# Patient Record
Sex: Female | Born: 1940 | Race: White | Hispanic: No | Smoking: Former smoker
Health system: Southern US, Community
[De-identification: ages and names within clinical notes are randomized; demographics above are authoritative.]

## PROBLEM LIST (undated history)

## (undated) DIAGNOSIS — J40 Bronchitis, not specified as acute or chronic: Secondary | ICD-10-CM

## (undated) DIAGNOSIS — F329 Major depressive disorder, single episode, unspecified: Secondary | ICD-10-CM

## (undated) DIAGNOSIS — F419 Anxiety disorder, unspecified: Secondary | ICD-10-CM

## (undated) DIAGNOSIS — M1990 Unspecified osteoarthritis, unspecified site: Secondary | ICD-10-CM

## (undated) DIAGNOSIS — E785 Hyperlipidemia, unspecified: Secondary | ICD-10-CM

## (undated) DIAGNOSIS — J45909 Unspecified asthma, uncomplicated: Secondary | ICD-10-CM

## (undated) DIAGNOSIS — F32A Depression, unspecified: Secondary | ICD-10-CM

## (undated) DIAGNOSIS — E7800 Pure hypercholesterolemia, unspecified: Secondary | ICD-10-CM

## (undated) HISTORY — DX: Hyperlipidemia, unspecified: E78.5

## (undated) HISTORY — PX: APPENDECTOMY: SHX54

## (undated) HISTORY — DX: Unspecified osteoarthritis, unspecified site: M19.90

## (undated) HISTORY — PX: BREAST BIOPSY: SHX20

## (undated) HISTORY — DX: Anxiety disorder, unspecified: F41.9

## (undated) HISTORY — PX: ABDOMINAL HYSTERECTOMY: SHX81

---

## 2010-12-02 ENCOUNTER — Ambulatory Visit: Payer: Self-pay | Admitting: Family Medicine

## 2012-05-18 ENCOUNTER — Ambulatory Visit: Payer: Self-pay | Admitting: Internal Medicine

## 2013-04-18 LAB — HM MAMMOGRAPHY: HM Mammogram: NORMAL

## 2014-02-25 ENCOUNTER — Ambulatory Visit: Payer: Self-pay | Admitting: Internal Medicine

## 2014-03-04 ENCOUNTER — Ambulatory Visit: Payer: Self-pay | Admitting: Internal Medicine

## 2014-10-22 ENCOUNTER — Ambulatory Visit (INDEPENDENT_AMBULATORY_CARE_PROVIDER_SITE_OTHER): Payer: Medicare HMO | Admitting: Internal Medicine

## 2014-10-22 ENCOUNTER — Encounter: Payer: Self-pay | Admitting: Internal Medicine

## 2014-10-22 VITALS — BP 110/60 | HR 104 | Temp 99.0°F | Ht 60.0 in | Wt 138.4 lb

## 2014-10-22 DIAGNOSIS — J40 Bronchitis, not specified as acute or chronic: Secondary | ICD-10-CM | POA: Insufficient documentation

## 2014-10-22 DIAGNOSIS — E782 Mixed hyperlipidemia: Secondary | ICD-10-CM | POA: Insufficient documentation

## 2014-10-22 DIAGNOSIS — D510 Vitamin B12 deficiency anemia due to intrinsic factor deficiency: Secondary | ICD-10-CM

## 2014-10-22 DIAGNOSIS — J45909 Unspecified asthma, uncomplicated: Secondary | ICD-10-CM

## 2014-10-22 DIAGNOSIS — F329 Major depressive disorder, single episode, unspecified: Secondary | ICD-10-CM | POA: Insufficient documentation

## 2014-10-22 DIAGNOSIS — N951 Menopausal and female climacteric states: Secondary | ICD-10-CM | POA: Insufficient documentation

## 2014-10-22 DIAGNOSIS — F32A Depression, unspecified: Secondary | ICD-10-CM | POA: Insufficient documentation

## 2014-10-22 DIAGNOSIS — E538 Deficiency of other specified B group vitamins: Secondary | ICD-10-CM | POA: Insufficient documentation

## 2014-10-22 HISTORY — DX: Unspecified asthma, uncomplicated: J45.909

## 2014-10-22 MED ORDER — ALBUTEROL SULFATE HFA 108 (90 BASE) MCG/ACT IN AERS: 2.0000 | INHALATION_SPRAY | Freq: Four times a day (QID) | RESPIRATORY_TRACT | Status: DC | PRN

## 2014-10-22 MED ORDER — AMOXICILLIN-POT CLAVULANATE 875-125 MG PO TABS: 1.0000 | ORAL_TABLET | Freq: Two times a day (BID) | ORAL | Status: DC

## 2014-10-22 MED ORDER — GUAIFENESIN-CODEINE 100-10 MG/5ML PO SYRP: 5.0000 mL | ORAL_SOLUTION | Freq: Three times a day (TID) | ORAL | Status: DC | PRN

## 2014-10-22 NOTE — Progress Notes (Signed)
Date:  10/22/2014   Name:  Jennifer Knapp   DOB:  02-28-1941   MRN:  [id number]   Chief Complaint: Cough Cough This is a new problem. The current episode started in the past 7 days. The problem has been gradually improving. The problem occurs constantly. The cough is productive of purulent sputum. Associated symptoms include a fever, nasal congestion, a sore throat and shortness of breath. Pertinent negatives include no chest pain, chills or headaches. She has tried OTC cough suppressant for the symptoms. The treatment provided no relief. Her past medical history is significant for asthma.     Review of Systems:  Review of Systems  Constitutional: Positive for fever. Negative for chills.  HENT: Positive for sore throat.   Respiratory: Positive for cough and shortness of breath.   Cardiovascular: Negative for chest pain.  Gastrointestinal: Negative for abdominal pain.  Neurological: Negative for syncope, light-headedness and headaches.    Patient Active Problem List   Diagnosis Date Noted  . 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 Clinical depression 10/22/2014  . AB (asthmatic bronchitis) 10/22/2014  . Addison anemia 10/22/2014  . Hot flash, menopausal 10/22/2014    Prior to Admission medications   Medication Sig Start Date End Date Taking? Authorizing Provider  ARIPiprazole (ABILIFY) 10 MG tablet Take 5 mg by mouth daily at 2 PM daily at 2 PM.   Yes Historical Provider, MD  desvenlafaxine (PRISTIQ) 50 MG 24 hr tablet Take 1 tablet by mouth daily.   Yes Historical Provider, MD  diazepam (VALIUM) 5 MG tablet Take 1 tablet by mouth 2 (two) times daily.   Yes Historical Provider, MD  estradiol (ESTRACE) 0.5 MG tablet Take 1 tablet by mouth daily. 12/31/13  Yes Historical Provider, MD  simvastatin (ZOCOR) 40 MG tablet Take 1 tablet by mouth daily. 09/01/14  Yes Historical Provider, MD    No Known Allergies  Past Surgical History  Procedure Laterality Date   . Breast biopsy      x 2  . Appendectomy    . Abdominal hysterectomy      History  Substance Use Topics  . Smoking status: Former Games developermoker  . Smokeless tobacco: Not on file  . Alcohol Use: No     Medication list has been reviewed and updated.  Physical Examination:  Physical Exam  Constitutional: She appears well-developed and well-nourished. No distress.  HENT:  Right Ear: Tympanic membrane and ear canal normal.  Left Ear: Tympanic membrane and ear canal normal.  Nose: Right sinus exhibits no maxillary sinus tenderness. Left sinus exhibits no maxillary sinus tenderness.  Mouth/Throat: Posterior oropharyngeal erythema present.  Eyes: Pupils are equal, round, and reactive to light.  Neck: Normal range of motion. Neck supple. No thyromegaly present.  Cardiovascular: Normal rate, regular rhythm and S1 normal.   Pulmonary/Chest: Effort normal. She has decreased breath sounds. She has no wheezes. She has no rhonchi.  Lymphadenopathy:    She has no cervical adenopathy.  Psychiatric: She has a normal mood and affect.    BP 110/60 mmHg  Pulse 104  Temp(Src) 99 F (37.2 C)  Ht 5' (1.524 m)  Wt 138 lb 6.4 oz (62.778 kg)  BMI 27.03 kg/m2  SpO2 96%  Assessment and Plan: 1. Bronchitis Increase fluids; take tylenol for fever - amoxicillin-clavulanate (AUGMENTIN) 875-125 MG per tablet; Take 1 tablet by mouth 2 (two) times daily.  Dispense: 20 tablet; Refill: 0 - albuterol (PROVENTIL HFA;VENTOLIN HFA) 108 (90 BASE) MCG/ACT inhaler; Inhale 2 puffs into  the lungs every 6 (six) hours as needed for wheezing or shortness of breath.  Dispense: 1 Inhaler; Refill: 0 - guaiFENesin-codeine (ROBITUSSIN AC) 100-10 MG/5ML syrup; Take 5 mLs by mouth 3 (three) times daily as needed for cough.  Dispense: 120 mL; Refill: 0   Bari Edward, MD [facility]  10/22/2014

## 2014-10-23 ENCOUNTER — Ambulatory Visit: Payer: Self-pay | Admitting: Internal Medicine

## 2014-12-15 ENCOUNTER — Other Ambulatory Visit: Payer: Self-pay | Admitting: Internal Medicine

## 2015-03-10 ENCOUNTER — Other Ambulatory Visit: Payer: Self-pay | Admitting: Internal Medicine

## 2015-04-03 ENCOUNTER — Other Ambulatory Visit: Payer: Self-pay | Admitting: Internal Medicine

## 2015-04-19 ENCOUNTER — Emergency Department: Payer: Medicare Other

## 2015-04-19 ENCOUNTER — Encounter: Payer: Self-pay | Admitting: Emergency Medicine

## 2015-04-19 ENCOUNTER — Emergency Department
Admission: EM | Admit: 2015-04-19 | Discharge: 2015-04-20 | Disposition: A | Discharge status: Home / Self Care | Payer: Medicare Other | Attending: Emergency Medicine | Admitting: Emergency Medicine

## 2015-04-19 DIAGNOSIS — S199XXA Unspecified injury of neck, initial encounter: Secondary | ICD-10-CM | POA: Diagnosis not present

## 2015-04-19 DIAGNOSIS — Y998 Other external cause status: Secondary | ICD-10-CM | POA: Insufficient documentation

## 2015-04-19 DIAGNOSIS — Z792 Long term (current) use of antibiotics: Secondary | ICD-10-CM | POA: Diagnosis not present

## 2015-04-19 DIAGNOSIS — Z79899 Other long term (current) drug therapy: Secondary | ICD-10-CM | POA: Insufficient documentation

## 2015-04-19 DIAGNOSIS — S022XXA Fracture of nasal bones, initial encounter for closed fracture: Secondary | ICD-10-CM | POA: Insufficient documentation

## 2015-04-19 DIAGNOSIS — Y9389 Activity, other specified: Secondary | ICD-10-CM | POA: Insufficient documentation

## 2015-04-19 DIAGNOSIS — Y9289 Other specified places as the place of occurrence of the external cause: Secondary | ICD-10-CM | POA: Insufficient documentation

## 2015-04-19 DIAGNOSIS — S0033XA Contusion of nose, initial encounter: Secondary | ICD-10-CM | POA: Insufficient documentation

## 2015-04-19 DIAGNOSIS — S43004A Unspecified dislocation of right shoulder joint, initial encounter: Secondary | ICD-10-CM

## 2015-04-19 DIAGNOSIS — W1839XA Other fall on same level, initial encounter: Secondary | ICD-10-CM | POA: Insufficient documentation

## 2015-04-19 DIAGNOSIS — S0083XA Contusion of other part of head, initial encounter: Secondary | ICD-10-CM | POA: Diagnosis not present

## 2015-04-19 DIAGNOSIS — Z87891 Personal history of nicotine dependence: Secondary | ICD-10-CM | POA: Insufficient documentation

## 2015-04-19 DIAGNOSIS — S0992XA Unspecified injury of nose, initial encounter: Secondary | ICD-10-CM | POA: Diagnosis present

## 2015-04-19 DIAGNOSIS — S0031XA Abrasion of nose, initial encounter: Secondary | ICD-10-CM | POA: Insufficient documentation

## 2015-04-19 DIAGNOSIS — W19XXXA Unspecified fall, initial encounter: Secondary | ICD-10-CM

## 2015-04-19 HISTORY — DX: Major depressive disorder, single episode, unspecified: F32.9

## 2015-04-19 HISTORY — DX: Pure hypercholesterolemia, unspecified: E78.00

## 2015-04-19 HISTORY — DX: Depression, unspecified: F32.A

## 2015-04-19 NOTE — ED Notes (Signed)
Pt. States she was putting her dog away when she tripped, pt states she fell on face and rt. Shoulder.  Pt. States no pain when not moving.  ETOH on board.

## 2015-04-19 NOTE — ED Notes (Signed)
Pt. States she was putting dog away when she fell witnessed by husband.  Pt. Denies LOC.  Pt. Has deformity to nose and rt. Shoulder.  ETOH on board.

## 2015-04-20 ENCOUNTER — Emergency Department: Payer: Medicare Other

## 2015-04-20 LAB — COMPREHENSIVE METABOLIC PANEL
ALT: 16 U/L (ref 14–54)
AST: 28 U/L (ref 15–41)
Albumin: 4 g/dL (ref 3.5–5.0)
Alkaline Phosphatase: 50 U/L (ref 38–126)
Anion gap: 9 (ref 5–15)
BUN: 12 mg/dL (ref 6–20)
CO2: 25 mmol/L (ref 22–32)
Calcium: 9.1 mg/dL (ref 8.9–10.3)
Chloride: 102 mmol/L (ref 101–111)
Creatinine, Ser: 0.93 mg/dL (ref 0.44–1.00)
GFR calc Af Amer: >60 mL/min (ref >60)
GFR calc non Af Amer: 59 mL/min — ABNORMAL LOW (ref >60)
Glucose, Bld: 117 mg/dL — ABNORMAL HIGH (ref 65–99)
Potassium: 4.2 mmol/L (ref 3.5–5.1)
Sodium: 136 mmol/L (ref 135–145)
Total Bilirubin: 0.7 mg/dL (ref 0.3–1.2)
Total Protein: 7.2 g/dL (ref 6.5–8.1)

## 2015-04-20 LAB — TROPONIN I: Troponin I: <0.03 ng/mL (ref <0.031)

## 2015-04-20 LAB — CBC
HCT: 40.6 % (ref 35.0–47.0)
Hemoglobin: 13.6 g/dL (ref 12.0–16.0)
MCH: 30.8 pg (ref 26.0–34.0)
MCHC: 33.5 g/dL (ref 32.0–36.0)
MCV: 91.7 fL (ref 80.0–100.0)
Platelets: 295 10*3/uL (ref 150–440)
RBC: 4.43 MIL/uL (ref 3.80–5.20)
RDW: 13.6 % (ref 11.5–14.5)
WBC: 15.4 10*3/uL — ABNORMAL HIGH (ref 3.6–11.0)

## 2015-04-20 MED ORDER — SODIUM CHLORIDE 0.9 % IV SOLN
INTRAVENOUS | Status: AC | PRN
  Administered 2015-04-20: 100 mL/h via INTRAVENOUS

## 2015-04-20 MED ORDER — LIDOCAINE HCL (PF) 1 % IJ SOLN
INTRAMUSCULAR | Status: AC
  Filled 2015-04-20: qty 15

## 2015-04-20 MED ORDER — ETOMIDATE 2 MG/ML IV SOLN
INTRAVENOUS | Status: AC | PRN
  Administered 2015-04-20: 6 mg via INTRAVENOUS

## 2015-04-20 MED ORDER — LIDOCAINE-EPINEPHRINE (PF) 1 %-1:200000 IJ SOLN
INTRAMUSCULAR | Status: AC
  Filled 2015-04-20: qty 30

## 2015-04-20 MED ORDER — ONDANSETRON 4 MG PO TBDP
4.0000 mg | ORAL_TABLET | Freq: Once | ORAL | Status: AC
  Administered 2015-04-20: 4 mg via ORAL

## 2015-04-20 MED ORDER — MORPHINE SULFATE (PF) 4 MG/ML IV SOLN
4.0000 mg | Freq: Once | INTRAVENOUS | Status: AC
Start: 2015-04-20 — End: 2015-04-20
  Administered 2015-04-20: 4 mg via INTRAVENOUS
  Filled 2015-04-20: qty 1

## 2015-04-20 MED ORDER — ETOMIDATE 2 MG/ML IV SOLN
INTRAVENOUS | Status: AC
  Filled 2015-04-20: qty 10

## 2015-04-20 MED ORDER — ONDANSETRON 4 MG PO TBDP
ORAL_TABLET | ORAL | Status: AC
  Administered 2015-04-20: 4 mg via ORAL
  Filled 2015-04-20: qty 1

## 2015-04-20 MED ORDER — ONDANSETRON HCL 4 MG/2ML IJ SOLN
4.0000 mg | Freq: Once | INTRAMUSCULAR | Status: AC
  Administered 2015-04-20: 4 mg via INTRAVENOUS
  Filled 2015-04-20: qty 2

## 2015-04-20 MED ORDER — OXYCODONE-ACETAMINOPHEN 5-325 MG PO TABS: 1.0000 | ORAL_TABLET | Freq: Four times a day (QID) | ORAL | Status: DC | PRN

## 2015-04-20 NOTE — ED Notes (Signed)
Discussed moderate sedation discharge instructions with patient and sister before procedure.

## 2015-04-20 NOTE — ED Provider Notes (Signed)
[facility] Emergency Department Provider Note  ____________________________________________  Time seen: Approximately 0009 AM  I have reviewed the triage vital signs and the nursing notes.   HISTORY  Chief Complaint Fall    HPI Jennifer Knapp is a 75 y.o. female who comes into the hospital today with a fall. The patient reports that she fell forward when she was trying to pick up her dog. She reports that she fell on her nose and her shoulder and she thinks that her shoulder is dislocated. The patient denies any loss of consciousness and reports that she just lost her balance when she bent over. The patient reports her pain as a 10 out of 10 in intensity whenever she moves her right arm. The patient also has some mild neck pain and abrasion to her nose. The patient came in by EMS for evaluation and treatment of her symptoms.   Past Medical History  Diagnosis Date  . Depression   . Elevated cholesterol     Patient Active Problem List   Diagnosis Date Noted  . 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 Clinical depression 10/22/2014  . AB (asthmatic bronchitis) 10/22/2014  . Addison anemia 10/22/2014  . Hot flash, menopausal 10/22/2014  . Bronchitis 10/22/2014    Past Surgical History  Procedure Laterality Date  . Breast biopsy      x 2  . Appendectomy    . Abdominal hysterectomy      Current Outpatient Rx  Name  Route  Sig  Dispense  Refill  . albuterol (PROVENTIL HFA;VENTOLIN HFA) 108 (90 BASE) MCG/ACT inhaler   Inhalation   Inhale 2 puffs into the lungs every 6 (six) hours as needed for wheezing or shortness of breath.   1 Inhaler   0   . amoxicillin-clavulanate (AUGMENTIN) 875-125 MG per tablet   Oral   Take 1 tablet by mouth 2 (two) times daily. Patient not taking: Reported on 04/20/2015   20 tablet   0   . ARIPiprazole (ABILIFY) 10 MG tablet   Oral   Take 5 mg by mouth daily at 2 PM daily at 2 PM.          . desvenlafaxine (PRISTIQ) 50 MG 24 hr tablet   Oral   Take 1 tablet by mouth daily.         . diazepam (VALIUM) 5 MG tablet   Oral   Take 1 tablet by mouth 2 (two) times daily.         Marland Kitchen estradiol (ESTRACE) 0.5 MG tablet      TAKE 1 TABLET ONCE DAILY   90 tablet   0   . guaiFENesin-codeine (ROBITUSSIN AC) 100-10 MG/5ML syrup   Oral   Take 5 mLs by mouth 3 (three) times daily as needed for cough. Patient not taking: Reported on 04/20/2015   120 mL   0   . oxyCODONE-acetaminophen (ROXICET) 5-325 MG tablet   Oral   Take 1 tablet by mouth every 6 (six) hours as needed.   12 tablet   0   . simvastatin (ZOCOR) 40 MG tablet      TAKE 1 TABLET AT BEDTIME.   30 tablet   0     Allergies Review of patient's allergies indicates no known allergies.  Family History  Problem Relation Age of Onset  . Cancer Mother     colon  . Depression Father     Social History Social History  Substance Use Topics  .  Smoking status: Former Games developermoker  . Smokeless tobacco: None  . Alcohol Use: 0.0 oz/week    0 Standard drinks or equivalent per week    Review of Systems Constitutional: No fever/chills Eyes: No visual changes. ENT: Nose pain and swelling Cardiovascular: Denies chest pain. Respiratory: Denies shortness of breath. Gastrointestinal: No abdominal pain.  No nausea, no vomiting.  No diarrhea.  No constipation. Genitourinary: Negative for dysuria. Musculoskeletal: Right arm pain and neck pain Skin: Abrasion to nose Neurological: Negative for headaches, focal weakness or numbness.  10-point ROS otherwise negative.  ____________________________________________   PHYSICAL EXAM:  VITAL SIGNS: ED Triage Vitals  Enc Vitals Group     BP 04/19/15 2317 134/64 mmHg     Pulse Rate 04/19/15 2317 87     Resp 04/19/15 2317 18     Temp 04/19/15 2317 97.7 F (36.5 C)     Temp Source 04/19/15 2317 Oral     SpO2 04/19/15 2317 99 %     Weight 04/19/15 2317 142 lb (64.411  kg)     Height 04/19/15 2317 5\' 1"  (1.549 m)     Head Cir --      Peak Flow --      Pain Score 04/19/15 2319 0     Pain Loc --      Pain Edu? --      Excl. in GC? --     Constitutional: Alert and oriented. Well appearing and in moderate distress. Eyes: Conjunctivae are normal. PERRL. EOMI. Head: Abrasions to nose Nose: Swelling to bridge of nose and ecchymosis Mouth/Throat: Mucous membranes are moist.  Oropharynx non-erythematous. Neck:  cervical spine tenderness to palpation. Cardiovascular: Normal rate, regular rhythm. Grossly normal heart sounds.  Good peripheral circulation. Respiratory: Normal respiratory effort.  No retractions. Lungs CTAB. Gastrointestinal: Soft and nontender. No distention. Positive bowel sounds Musculoskeletal: Deformity to right shoulder with tenderness to palpation and pain with passive range of motion Neurologic:  Normal speech and language.  Skin:  Abrasions to nose Psychiatric: Mood and affect are normal.   ____________________________________________   LABS (all labs ordered are listed, but only abnormal results are displayed)  Labs Reviewed  COMPREHENSIVE METABOLIC PANEL - Abnormal; Notable for the following:    Glucose, Bld 117 (*)    GFR calc non Af Amer 59 (*)    All other components within normal limits  CBC - Abnormal; Notable for the following:    WBC 15.4 (*)    All other components within normal limits  TROPONIN I   ____________________________________________  EKG  ED ECG REPORT I, Rebecka ApleyWebster,   P, the attending physician, personally viewed and interpreted this ECG.   Date: 04/20/2015  EKG Time: 126  Rate: 78  Rhythm: normal sinus rhythm  Axis: normal  Intervals:none  ST&T Change: normal  ____________________________________________  RADIOLOGY  CT head, cervical spine and maxillofacial: No acute intracranial abnormality, minimally displaced right nasal bone fracture, multilevel degenerative change throughout the  cervical spine without acute fracture or subluxation, incidental notes of paravertebral soft tissue density adjacent to T3 measuring 1.7 cm  Shoulder x-ray: Anterior shoulder dislocation, no evidence of associated fracture  Shoulder x-ray repeat: Normal alignment postreduction, no evidence of acute fracture. ____________________________________________   PROCEDURES  Procedure(s) performed: please, see procedure note(s).   Reduction of dislocation Date/Time: 1:41 AM Performed by: Lucrezia EuropeWebster,   P Authorized by: Lucrezia EuropeWebster,   P Consent: Verbal consent obtained. Risks and benefits: risks, benefits and alternatives were discussed Consent given by: patient Required items:  required blood products, implants, devices, and special equipment available Time out: Immediately prior to procedure a "time out" was called to verify the correct patient, procedure, equipment, support staff and site/side marked as required.  Patient sedated: with etomidate  Vitals: Vital signs were monitored during sedation. Patient tolerance: Patient tolerated the procedure well with no immediate complications. Joint: right shoulder Reduction technique: traction and external rotation    Critical Care performed: No  ____________________________________________   INITIAL IMPRESSION / ASSESSMENT AND PLAN / ED COURSE  Pertinent labs & imaging results that were available during my care of the patient were reviewed by me and considered in my medical decision making (see chart for details).  This is a 75 year old female who comes to the hospital today after a fall. The patient has a shoulder dislocation as well as a nasal bone fracture. I did reduce the patient's shoulder dislocation with traction and external rotation. The patient's shoulder was reduced without much difficulty. The patient initially received some morphine and Zofran. I also did inject the patient's shoulder with lidocaine. The patient did wake  up and was able to drink and eat without any vomiting. I reassessed the patient and she feels ready to be discharged home. The patient be discharged home to follow-up with orthopedic surgery. ____________________________________________   FINAL CLINICAL IMPRESSION(S) / ED DIAGNOSES  Final diagnoses:  Fall, initial encounter  Facial contusion, initial encounter  Nasal bone fracture, closed, initial encounter  Shoulder dislocation, right, initial encounter      Rebecka Apley, MD 04/20/15 [phone]

## 2015-04-20 NOTE — Discharge Instructions (Signed)
Contusion A contusion is a deep bruise. Contusions are the result of a blunt injury to tissues and muscle fibers under the skin. The injury causes bleeding under the skin. The skin overlying the contusion may turn blue, purple, or yellow. Minor injuries will give you a painless contusion, but more severe contusions may stay painful and swollen for a few weeks.  CAUSES  This condition is usually caused by a blow, trauma, or direct force to an area of the body. SYMPTOMS  Symptoms of this condition include:  Swelling of the injured area.  Pain and tenderness in the injured area.  Discoloration. The area may have redness and then turn blue, purple, or yellow. DIAGNOSIS  This condition is diagnosed based on a physical exam and medical history. An X-ray, CT scan, or MRI may be needed to determine if there are any associated injuries, such as broken bones (fractures). TREATMENT  Specific treatment for this condition depends on what area of the body was injured. In general, the best treatment for a contusion is resting, icing, applying pressure to (compression), and elevating the injured area. This is often called the RICE strategy. Over-the-counter anti-inflammatory medicines may also be recommended for pain control.  HOME CARE INSTRUCTIONS   Rest the injured area.  If directed, apply ice to the injured area:  Put ice in a plastic bag.  Place a towel between your skin and the bag.  Leave the ice on for 20 minutes, 2-3 times per day.  If directed, apply light compression to the injured area using an elastic bandage. Make sure the bandage is not wrapped too tightly. Remove and reapply the bandage as directed by your health care provider.  If possible, raise (elevate) the injured area above the level of your heart while you are sitting or lying down.  Take over-the-counter and prescription medicines only as told by your health care provider. SEEK MEDICAL CARE IF:  Your symptoms do not  improve after several days of treatment.  Your symptoms get worse.  You have difficulty moving the injured area. SEEK IMMEDIATE MEDICAL CARE IF:   You have severe pain.  You have numbness in a hand or foot.  Your hand or foot turns pale or cold.   This information is not intended to replace advice given to you by your health care provider. Make sure you discuss any questions you have with your health care provider.   Document Released: 01/12/2005 Document Revised: 12/24/2014 Document Reviewed: 08/20/2014 Elsevier Interactive Patient Education 2016 Else[employer] Inc.  Nasal Fracture A nasal fracture is a break or crack in the bones or cartilage of the nose. Minor breaks do not require treatment. These breaks usually heal on their own after about one month. Serious breaks may require surgery. CAUSES This injury is usually caused by a blunt injury to the nose. This type of injury often occurs from:  Contact sports.  Car accidents.  Falls.  Getting punched. SYMPTOMS Symptoms of this injury include:  Pain.  Swelling of the nose.  Bleeding from the nose.  Bruising around the nose or eyes. This may include having black eyes.  Crooked appearance of the nose. DIAGNOSIS This injury may be diagnosed with a physical exam. The health care provider will gently feel the nose for signs of broken bones. He or she will look inside the nostrils to make sure that there is not a blood-filled swelling on the dividing wall between the nostrils (septal hematoma). X-rays of the nose may not show a  nasal fracture even when one is present. In some cases, X-rays or a CT scan may be done 1-5 days after the injury. Sometimes, the health care provider will want to wait until the swelling has gone down. TREATMENT Often, minor fractures that have caused no deformity do not require treatment. More serious fractures in which bones have moved out of position may require surgery, which will take place after the  swelling is gone. Surgery will stabilize and align the fracture. In some cases, a health care provider may be able to reposition the bones without surgery. This may be done in the health care provider's office after medicine is given to numb the area (local anesthetic). HOME CARE INSTRUCTIONS  If directed, apply ice to the injured area:  Put ice in a plastic bag.  Place a towel between your skin and the bag.  Leave the ice on for 20 minutes, 2-3 times per day.  Take over-the-counter and prescription medicines only as told by your health care provider.  If your nose starts to bleed, sit in an upright position while you squeeze the soft parts of your nose against the dividing wall between your nostrils (septum) for 10 minutes.  Try to avoid blowing your nose.  Return to your normal activities as told by your health care provider. Ask your health care provider what activities are safe for you.  Avoid contact sports for 3-4 weeks or as told by your health care provider.  Keep all follow-up visits as told by your health care provider. This is important. SEEK MEDICAL CARE IF:  Your pain increases or becomes severe.  You continue to have nosebleeds.  The shape of your nose does not return to normal within 5 days.  You have pus draining out of your nose. SEEK IMMEDIATE MEDICAL CARE IF:  You have bleeding from your nose that does not stop after you pinch your nostrils closed for 20 minutes and keep ice on your nose.  You have clear fluid draining out of your nose.  You notice a grape-like swelling on the septum. This swelling is a collection of blood (hematoma) that must be drained to help prevent infection.  You have difficulty moving your eyes.  You have repeated vomiting.   This information is not intended to replace advice given to you by your health care provider. Make sure you discuss any questions you have with your health care provider.   Document Released: 04/01/2000  Document Revised: 12/24/2014 Document Reviewed: 05/12/2014 Elsevier Interactive Patient Education 2016 Elsevier Inc.  Shoulder Dislocation A shoulder dislocation happens when the upper arm bone (humerus) moves out of the shoulder joint. The shoulder joint is the part of the shoulder where the humerus, shoulder blade (scapula), and collarbone (clavicle) meet. CAUSES This condition is often caused by:  A fall.  A hit to the shoulder.  A forceful movement of the shoulder. RISK FACTORS This condition is more likely to develop in people who play sports. SYMPTOMS Symptoms of this condition include:  Deformity of the shoulder.  Intense pain.  Inability to move the shoulder.  Numbness, weakness, or tingling in your neck or down your arm.  Bruising or swelling around your shoulder. DIAGNOSIS This condition is diagnosed with a physical exam. After the exam, tests may be done to check for related problems. Tests that may be done include:  X-ray. This may be done to check for broken bones.  MRI. This may be done to check for damage to the tissues around  the shoulder.  Electromyogram. This may be done to check for nerve damage. TREATMENT This condition is treated with a procedure to place the humerus back in the joint. This procedure is called a reduction. There are two types of reduction:  Closed reduction. In this procedure, the humerus is placed back in the joint without surgery. The health care provider uses his or her hands to guide the bone back into place.  Open reduction. In this procedure, the humerus is placed back in the joint with surgery. An open reduction may be recommended if:  You have a weak shoulder joint or weak ligaments.  You have had more than one shoulder dislocation.  The nerves or blood vessels around your shoulder have been damaged. After the humerus is placed back into the joint, your arm will be placed in a splint or sling to prevent it from moving. You  will need to wear the splint or sling until your shoulder heals. When the splint or sling is removed, you may have physical therapy to help improve the range of motion in your shoulder joint. HOME CARE INSTRUCTIONS If You Have a Splint or Sling:  Wear it as told by your health care provider. Remove it only as told by your health care provider.  Loosen it if your fingers become numb and tingle, or if they turn cold and blue.  Keep it clean and dry. Bathing  Do not take baths, swim, or use a hot tub until your health care provider approves. Ask your health care provider if you can take showers. You may only be allowed to take sponge baths for bathing.  If your health care provider approves bathing and showering, cover your splint or sling with a watertight plastic bag to protect it from water. Do not let the splint or sling get wet. Managing Pain, Stiffness, and Swelling  If directed, apply ice to the injured area.  Put ice in a plastic bag.  Place a towel between your skin and the bag.  Leave the ice on for 20 minutes, 2-3 times per day.  Move your fingers often to avoid stiffness and to decrease swelling.  Raise (elevate) the injured area above the level of your heart while you are sitting or lying down. Driving  Do not drive while wearing a splint or sling on a hand that you use for driving.  Do not drive or operate heavy machinery while taking pain medicine. Activity  Return to your normal activities as told by your health care provider. Ask your health care provider what activities are safe for you.  Perform range-of-motion exercises only as told by your health care provider.  Exercise your hand by squeezing a soft ball. This helps to decrease stiffness and swelling in your hand and wrist. General Instructions  Take over-the-counter and prescription medicines only as told by your health care provider.  Do not use any tobacco products, including cigarettes, chewing  tobacco, or e-cigarettes. Tobacco can delay bone and tissue healing. If you need help quitting, ask your health care provider.  Keep all follow-up visits as told by your health care provider. This is important. SEEK MEDICAL CARE IF:  Your splint or sling gets damaged. SEEK IMMEDIATE MEDICAL CARE IF:  Your pain gets worse rather than better.  You lose feeling in your arm or hand.  Your arm or hand becomes white and cold.   This information is not intended to replace advice given to you by your health care provider. Make  sure you discuss any questions you have with your health care provider.   Document Released: 12/28/2000 Document Revised: 12/24/2014 Document Reviewed: 07/28/2014 Elsevier Interactive Patient Education [employer]2016 Elsevier Inc.

## 2015-04-20 NOTE — ED Notes (Signed)
Pt. Going home with family. 

## 2015-04-20 NOTE — ED Notes (Signed)
Pt. Sister signed consent for pt.  Pt. Alert and oriented, with dislocated rt. shoulder

## 2015-04-22 ENCOUNTER — Ambulatory Visit: Payer: Medicare HMO | Admitting: Internal Medicine

## 2015-05-07 ENCOUNTER — Other Ambulatory Visit: Payer: Self-pay | Admitting: Internal Medicine

## 2015-05-07 NOTE — Telephone Encounter (Signed)
pts coming in on 05/11/15

## 2015-05-11 ENCOUNTER — Encounter: Payer: Self-pay | Admitting: Internal Medicine

## 2015-05-11 ENCOUNTER — Ambulatory Visit (INDEPENDENT_AMBULATORY_CARE_PROVIDER_SITE_OTHER): Payer: Medicare Other | Admitting: Internal Medicine

## 2015-05-11 VITALS — BP 126/70 | HR 84 | Ht 61.0 in | Wt 139.8 lb

## 2015-05-11 DIAGNOSIS — E784 Other hyperlipidemia: Secondary | ICD-10-CM | POA: Diagnosis not present

## 2015-05-11 DIAGNOSIS — E7849 Other hyperlipidemia: Secondary | ICD-10-CM

## 2015-05-11 DIAGNOSIS — F329 Major depressive disorder, single episode, unspecified: Secondary | ICD-10-CM | POA: Diagnosis not present

## 2015-05-11 DIAGNOSIS — F32A Depression, unspecified: Secondary | ICD-10-CM

## 2015-05-11 DIAGNOSIS — Z1239 Encounter for other screening for malignant neoplasm of breast: Secondary | ICD-10-CM

## 2015-05-11 DIAGNOSIS — S43004A Unspecified dislocation of right shoulder joint, initial encounter: Secondary | ICD-10-CM

## 2015-05-11 NOTE — Progress Notes (Signed)
Date:  05/11/2015   Name:  Jennifer Knapp   DOB:  [birth year]   MRN:  [id number]   Chief Complaint: Follow-up; Hyperlipidemia; and Shoulder Pain Hyperlipidemia This is a chronic problem. The current episode started more than 1 year ago. The problem is controlled. Recent lipid tests were reviewed and are normal. Pertinent negatives include no chest pain, focal weakness, leg pain or shortness of breath.  Shoulder Pain  The pain is present in the right shoulder. The current episode started 1 to 4 weeks ago. There has been a history of trauma (dislocated at home - reduced in the ER). The problem has been gradually improving. The quality of the pain is described as aching. Associated symptoms include stiffness.  Depression        This is a chronic problem.The problem is unchanged.  Associated symptoms include no fatigue.  Past treatments include SNRIs - Serotonin and norepinephrine reuptake inhibitors (and Abilify).  Previous treatment provided significant (still followed by Psychiatry) relief.    Review of Systems  Constitutional: Negative for chills, diaphoresis and fatigue.  Respiratory: Negative for chest tightness and shortness of breath.   Cardiovascular: Negative for chest pain.  Gastrointestinal: Negative for abdominal pain.  Musculoskeletal: Positive for arthralgias and stiffness. Negative for joint swelling and gait problem.  Neurological: Negative for focal weakness.  Psychiatric/Behavioral: Positive for depression. Negative for sleep disturbance and dysphoric mood.    Patient Active Problem List   Diagnosis Date Noted  . 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 Clinical depression 10/22/2014  . AB (asthmatic bronchitis) 10/22/2014  . Addison anemia 10/22/2014  . Hot flash, menopausal 10/22/2014  . Bronchitis 10/22/2014    Prior to Admission medications   Medication Sig Start Date End Date Taking? Authorizing Provider  ARIPiprazole (ABILIFY) 10  MG tablet Take 5 mg by mouth daily at 2 PM daily at 2 PM.   Yes Historical Provider, MD  desvenlafaxine (PRISTIQ) 50 MG 24 hr tablet Take 1 tablet by mouth daily.   Yes Historical Provider, MD  diazepam (VALIUM) 5 MG tablet Take 1 tablet by mouth 2 (two) times daily.   Yes Historical Provider, MD  estradiol (ESTRACE) 0.5 MG tablet TAKE 1 TABLET ONCE DAILY 03/10/15  Yes Reubin Milan, MD  simvastatin (ZOCOR) 40 MG tablet TAKE 1 TABLET AT BEDTIME. 05/07/15  Yes Reubin Milan, MD    No Known Allergies  Past Surgical History  Procedure Laterality Date  . Breast biopsy      x 2  . Appendectomy    . Abdominal hysterectomy      Social History  Substance Use Topics  . Smoking status: Former Games developer  . Smokeless tobacco: None  . Alcohol Use: 0.0 oz/week    0 Standard drinks or equivalent per week     Medication list has been reviewed and updated.   Physical Exam  Constitutional: She is oriented to person, place, and time. She appears well-developed. No distress.  HENT:  Head: Normocephalic and atraumatic.  Neck: Normal range of motion. Neck supple. No thyromegaly present.  Cardiovascular: Normal rate, regular rhythm and normal heart sounds.   Pulmonary/Chest: Effort normal and breath sounds normal. No respiratory distress.  Musculoskeletal: She exhibits no edema.       Right shoulder: She exhibits tenderness. She exhibits no swelling and no effusion.  Good active ROM in right shoulder with minimal discomfort  Neurological: She is alert and oriented to person, place, and time.  Skin: Skin is  warm and dry. No rash noted.  Psychiatric: She has a normal mood and affect. Her behavior is normal. Thought content normal.  Nursing note and vitals reviewed.   BP 126/70 mmHg  Pulse 84  Ht  (1.549 m)  Wt 139 lb 12.8 oz (63.413 kg)  BMI 26.43 kg/m2  Assessment and Plan: 1. Familial multiple lipoprotein-type hyperlipidemia Controlled on statin therapy - Comprehensive metabolic  panel - Lipid panel  2. Shoulder dislocation, right, initial encounter Continue ROM exercises at home Tylenol if needed  3. Clinical depression Controlled on current medication  4. Breast cancer screening - MM DIGITAL SCREENING BILATERAL; Future   Bari Edward, MD [facility]  05/11/2015

## 2015-05-12 ENCOUNTER — Telehealth: Payer: Self-pay

## 2015-05-12 LAB — COMPREHENSIVE METABOLIC PANEL
ALT: 16 IU/L (ref 0–32)
AST: 21 IU/L (ref 0–40)
Albumin/Globulin Ratio: 1.4 (ref 1.1–2.5)
Albumin: 4.2 g/dL (ref 3.5–4.8)
Alkaline Phosphatase: 53 [IU]/L (ref 39–117)
BUN/Creatinine Ratio: 8 — ABNORMAL LOW (ref 11–26)
BUN: 10 mg/dL (ref 8–27)
Bilirubin Total: 0.2 mg/dL (ref 0.0–1.2)
CO2: 25 mmol/L (ref 18–29)
Calcium: 9.8 mg/dL (ref 8.7–10.3)
Chloride: 100 mmol/L (ref 96–106)
Creatinine, Ser: 1.22 mg/dL — ABNORMAL HIGH (ref 0.57–1.00)
GFR calc Af Amer: 50 mL/min/{1.73_m2} — ABNORMAL LOW (ref >59)
GFR calc non Af Amer: 43 mL/min/{1.73_m2} — ABNORMAL LOW (ref >59)
Globulin, Total: 3 g/dL (ref 1.5–4.5)
Glucose: 86 mg/dL (ref 65–99)
Potassium: 4.3 mmol/L (ref 3.5–5.2)
Sodium: 141 mmol/L (ref 134–144)
Total Protein: 7.2 g/dL (ref 6.0–8.5)

## 2015-05-12 LAB — LIPID PANEL
Chol/HDL Ratio: 3 ratio units (ref 0.0–4.4)
Cholesterol, Total: 192 mg/dL (ref 100–199)
HDL: 64 mg/dL (ref >39)
LDL Calculated: 101 mg/dL — ABNORMAL HIGH (ref 0–99)
Triglycerides: 136 mg/dL (ref 0–149)
VLDL Cholesterol Cal: 27 mg/dL (ref 5–40)

## 2015-05-12 NOTE — Telephone Encounter (Signed)
Spoke with patient. Patient advised of all results and verbalized understanding. Will call back with any future questions or concerns.   

## 2015-05-12 NOTE — Telephone Encounter (Signed)
-----   Message from Reubin Milan, MD sent at 05/12/2015  9:04 AM EST ----- Kidney function is slightly decreased. Try to drink more fluids. All other labs are normal. Cholesterol is good. We will recheck at next appointment in July.

## 2015-05-20 NOTE — H&P (Signed)
See scanned note.

## 2015-05-24 NOTE — H&P (Signed)
See scanned note.

## 2015-05-25 ENCOUNTER — Ambulatory Visit
Admission: RE | Admit: 2015-05-25 | Discharge: 2015-05-25 | Disposition: A | Discharge status: Home / Self Care | Payer: Medicare Other | Source: Ambulatory Visit | Attending: Ophthalmology | Admitting: Ophthalmology

## 2015-05-25 ENCOUNTER — Encounter: Payer: Self-pay | Admitting: *Deleted

## 2015-05-25 ENCOUNTER — Ambulatory Visit: Payer: Medicare Other | Admitting: Anesthesiology

## 2015-05-25 ENCOUNTER — Encounter
Admission: RE | Disposition: A | Discharge status: Home / Self Care | Payer: Self-pay | Source: Ambulatory Visit | Attending: Ophthalmology

## 2015-05-25 DIAGNOSIS — H2512 Age-related nuclear cataract, left eye: Secondary | ICD-10-CM | POA: Insufficient documentation

## 2015-05-25 DIAGNOSIS — E7800 Pure hypercholesterolemia, unspecified: Secondary | ICD-10-CM | POA: Diagnosis not present

## 2015-05-25 DIAGNOSIS — Z87891 Personal history of nicotine dependence: Secondary | ICD-10-CM | POA: Diagnosis not present

## 2015-05-25 DIAGNOSIS — J449 Chronic obstructive pulmonary disease, unspecified: Secondary | ICD-10-CM | POA: Diagnosis not present

## 2015-05-25 DIAGNOSIS — H269 Unspecified cataract: Secondary | ICD-10-CM | POA: Diagnosis present

## 2015-05-25 DIAGNOSIS — D649 Anemia, unspecified: Secondary | ICD-10-CM | POA: Diagnosis not present

## 2015-05-25 DIAGNOSIS — H2513 Age-related nuclear cataract, bilateral: Secondary | ICD-10-CM | POA: Diagnosis not present

## 2015-05-25 DIAGNOSIS — F329 Major depressive disorder, single episode, unspecified: Secondary | ICD-10-CM | POA: Insufficient documentation

## 2015-05-25 HISTORY — PX: CATARACT EXTRACTION W/PHACO: SHX586

## 2015-05-25 SURGERY — PHACOEMULSIFICATION, CATARACT, WITH IOL INSERTION
Anesthesia: Monitor Anesthesia Care | Site: Eye | Laterality: Left | Wound class: Clean

## 2015-05-25 MED ORDER — NA CHONDROIT SULF-NA HYALURON 40-17 MG/ML IO SOLN
INTRAOCULAR | Status: DC | PRN
  Administered 2015-05-25: 1 mL via INTRAOCULAR

## 2015-05-25 MED ORDER — LIDOCAINE HCL (PF) 4 % IJ SOLN
INTRAMUSCULAR | Status: AC
  Filled 2015-05-25: qty 5

## 2015-05-25 MED ORDER — HYALURONIDASE HUMAN 150 UNIT/ML IJ SOLN
INTRAMUSCULAR | Status: AC
  Filled 2015-05-25: qty 1

## 2015-05-25 MED ORDER — ALFENTANIL 500 MCG/ML IJ INJ
INJECTION | INTRAMUSCULAR | Status: DC | PRN
  Administered 2015-05-25: 500 ug via INTRAVENOUS

## 2015-05-25 MED ORDER — CYCLOPENTOLATE HCL 2 % OP SOLN
OPHTHALMIC | Status: AC
  Filled 2015-05-25: qty 2

## 2015-05-25 MED ORDER — CYCLOPENTOLATE HCL 2 % OP SOLN: 1.0000 [drp] | OPHTHALMIC | Status: AC

## 2015-05-25 MED ORDER — BUPIVACAINE HCL (PF) 0.75 % IJ SOLN
INTRAMUSCULAR | Status: AC
  Filled 2015-05-25: qty 10

## 2015-05-25 MED ORDER — LIDOCAINE HCL (PF) 4 % IJ SOLN
INTRAOCULAR | Status: DC | PRN
  Administered 2015-05-25: 4 mL via OPHTHALMIC

## 2015-05-25 MED ORDER — TETRACAINE HCL 0.5 % OP SOLN
OPHTHALMIC | Status: DC | PRN
  Administered 2015-05-25: 2 [drp] via OPHTHALMIC

## 2015-05-25 MED ORDER — PHENYLEPHRINE HCL 2.5 % OP SOLN
1.0000 [drp] | OPHTHALMIC | Status: AC
  Administered 2015-05-25 (×3): 1 [drp] via OPHTHALMIC
  Filled 2015-05-25: qty 2

## 2015-05-25 MED ORDER — PHENYLEPHRINE HCL 2.5 % OP SOLN
1.0000 [drp] | OPHTHALMIC | Status: AC
  Filled 2015-05-25: qty 2

## 2015-05-25 MED ORDER — NA CHONDROIT SULF-NA HYALURON 40-17 MG/ML IO SOLN
INTRAOCULAR | Status: AC
  Filled 2015-05-25: qty 1

## 2015-05-25 MED ORDER — EPINEPHRINE HCL 1 MG/ML IJ SOLN
INTRAMUSCULAR | Status: AC
  Filled 2015-05-25: qty 2

## 2015-05-25 MED ORDER — SODIUM CHLORIDE 0.9 % IV SOLN: INTRAVENOUS | Status: DC

## 2015-05-25 MED ORDER — CEFUROXIME OPHTHALMIC INJECTION 1 MG/0.1 ML
INJECTION | OPHTHALMIC | Status: AC
  Filled 2015-05-25: qty 0.1

## 2015-05-25 MED ORDER — SODIUM CHLORIDE 0.9 % IV SOLN
INTRAVENOUS | Status: DC
  Administered 2015-05-25: 09:00:00 via INTRAVENOUS

## 2015-05-25 MED ORDER — CEFUROXIME OPHTHALMIC INJECTION 1 MG/0.1 ML
INJECTION | OPHTHALMIC | Status: DC | PRN
  Administered 2015-05-25: 1 mg via INTRACAMERAL

## 2015-05-25 MED ORDER — MOXIFLOXACIN HCL 0.5 % OP SOLN
OPHTHALMIC | Status: DC | PRN
  Administered 2015-05-25: 4 [drp] via OPHTHALMIC

## 2015-05-25 MED ORDER — PHENYLEPHRINE HCL 10 % OP SOLN
OPHTHALMIC | Status: AC
  Administered 2015-05-25: 09:00:00
  Filled 2015-05-25: qty 5

## 2015-05-25 MED ORDER — MOXIFLOXACIN HCL 0.5 % OP SOLN: 1.0000 [drp] | OPHTHALMIC | Status: AC

## 2015-05-25 MED ORDER — CYCLOPENTOLATE HCL 2 % OP SOLN
1.0000 [drp] | OPHTHALMIC | Status: AC
  Administered 2015-05-25 (×4): 1 [drp] via OPHTHALMIC

## 2015-05-25 MED ORDER — MOXIFLOXACIN HCL 0.5 % OP SOLN
1.0000 [drp] | OPHTHALMIC | Status: AC
  Administered 2015-05-25 (×3): 1 [drp] via OPHTHALMIC

## 2015-05-25 MED ORDER — MOXIFLOXACIN HCL 0.5 % OP SOLN
OPHTHALMIC | Status: AC
  Administered 2015-05-25: 1 [drp] via OPHTHALMIC
  Filled 2015-05-25: qty 3

## 2015-05-25 MED ORDER — CARBACHOL 0.01 % IO SOLN
INTRAOCULAR | Status: DC | PRN
  Administered 2015-05-25: 0.5 mL via INTRAOCULAR

## 2015-05-25 MED ORDER — MIDAZOLAM HCL 2 MG/2ML IJ SOLN
INTRAMUSCULAR | Status: DC | PRN
  Administered 2015-05-25: 1 mg via INTRAVENOUS

## 2015-05-25 MED ORDER — BSS IO SOLN
INTRAOCULAR | Status: DC | PRN
  Administered 2015-05-25: 250 mL via OPHTHALMIC

## 2015-05-25 MED ORDER — TETRACAINE HCL 0.5 % OP SOLN
OPHTHALMIC | Status: AC
  Filled 2015-05-25: qty 2

## 2015-05-25 MED ORDER — LIDOCAINE HCL (PF) 4 % IJ SOLN
INTRAMUSCULAR | Status: DC | PRN
  Administered 2015-05-25: 4 mL via OPHTHALMIC

## 2015-05-25 SURGICAL SUPPLY — 29 items
CANNULA ANT/CHMB 27GA (MISCELLANEOUS) ×2 IMPLANT
CORD BIP STRL DISP 12FT (MISCELLANEOUS) ×2 IMPLANT
CUP MEDICINE 2OZ PLAST GRAD ST (MISCELLANEOUS) ×2 IMPLANT
DRAPE XRAY CASSETTE 23X24 (DRAPES) ×2 IMPLANT
ERASER HMR WETFIELD 18G (MISCELLANEOUS) ×2 IMPLANT
GLOVE BIO SURGEON STRL SZ8 (GLOVE) ×2 IMPLANT
GLOVE SURG LX 6.5 MICRO (GLOVE) ×1
GLOVE SURG LX 8.0 MICRO (GLOVE) ×1
GLOVE SURG LX STRL 6.5 MICRO (GLOVE) ×1 IMPLANT
GLOVE SURG LX STRL 8.0 MICRO (GLOVE) ×1 IMPLANT
GOWN STRL REUS W/ TWL LRG LVL3 (GOWN DISPOSABLE) ×1 IMPLANT
GOWN STRL REUS W/ TWL XL LVL3 (GOWN DISPOSABLE) ×1 IMPLANT
GOWN STRL REUS W/TWL LRG LVL3 (GOWN DISPOSABLE) ×1
GOWN STRL REUS W/TWL XL LVL3 (GOWN DISPOSABLE) ×1
LENS IOL ACRYSOF IQ 21.0 (Intraocular Lens) ×2 IMPLANT
PACK CATARACT (MISCELLANEOUS) ×2 IMPLANT
PACK CATARACT DINGLEDEIN LX (MISCELLANEOUS) ×2 IMPLANT
PACK EYE AFTER SURG (MISCELLANEOUS) ×2 IMPLANT
SHLD EYE VISITEC  UNIV (MISCELLANEOUS) ×2 IMPLANT
SOL BSS BAG (MISCELLANEOUS) ×2
SOL PREP PVP 2OZ (MISCELLANEOUS) ×2
SOLUTION BSS BAG (MISCELLANEOUS) ×1 IMPLANT
SOLUTION PREP PVP 2OZ (MISCELLANEOUS) ×1 IMPLANT
SUT SILK 5-0 (SUTURE) ×2 IMPLANT
SYR 3ML LL SCALE MARK (SYRINGE) ×2 IMPLANT
SYR 5ML LL (SYRINGE) ×2 IMPLANT
SYR TB 1ML 27GX1/2 LL (SYRINGE) ×2 IMPLANT
WATER STERILE IRR 1000ML POUR (IV SOLUTION) ×2 IMPLANT
WIPE NON LINTING 3.25X3.25 (MISCELLANEOUS) ×2 IMPLANT

## 2015-05-25 NOTE — Anesthesia Procedure Notes (Signed)
Procedure Name: MAC Date/Time: 05/25/2015 10:52 AM Performed by: Henrietta Hoover Pre-anesthesia Checklist: Patient identified, Emergency Drugs available, Suction available, Patient being monitored and Timeout performed Patient Re-evaluated:Patient Re-evaluated prior to inductionOxygen Delivery Method: Nasal cannula Comments: Spontaneous resp throughout

## 2015-05-25 NOTE — Anesthesia Preprocedure Evaluation (Addendum)
Anesthesia Evaluation  Patient identified by MRN, date of birth, ID band Patient awake    Reviewed: Allergy & Precautions, NPO status , Patient's Chart, lab work & pertinent test results, reviewed documented beta blocker date and time   Airway Mallampati: II  TM Distance: >3 FB     Dental  (+) Chipped, Upper Dentures, Missing   Pulmonary asthma , former smoker,           Cardiovascular      Neuro/Psych PSYCHIATRIC DISORDERS Depression    GI/Hepatic   Endo/Other    Renal/GU      Musculoskeletal   Abdominal   Peds  Hematology  (+) anemia ,   Anesthesia Other Findings R shoulder dislocation few weeks ago. OK now.  Reproductive/Obstetrics                            Anesthesia Physical Anesthesia Plan  ASA: III  Anesthesia Plan: MAC   Post-op Pain Management:    Induction:   Airway Management Planned:   Additional Equipment:   Intra-op Plan:   Post-operative Plan:   Informed Consent: I have reviewed the patients History and Physical, chart, labs and discussed the procedure including the risks, benefits and alternatives for the proposed anesthesia with the patient or authorized representative who has indicated his/her understanding and acceptance.     Plan Discussed with: CRNA  Anesthesia Plan Comments:         Anesthesia Quick Evaluation

## 2015-05-25 NOTE — Transfer of Care (Signed)
Immediate Anesthesia Transfer of Care Note  Patient: Jennifer Knapp  Procedure(s) Performed: Procedure(s) with comments: CATARACT EXTRACTION PHACO AND INTRAOCULAR LENS PLACEMENT (IOC) (Left) - [place]: 01:25.9 AP%: 26.3 CDE: 37.92  Lot # [id number]  Patient Location: PACU and Short Stay  Anesthesia Type:MAC  Level of Consciousness: awake  Airway & Oxygen Therapy: Patient Spontanous Breathing  Post-op Assessment: Report given to RN and Post -op Vital signs reviewed and stable  Post vital signs: Reviewed and stable  Last Vitals:  Filed Vitals:   05/25/15 0914  BP: 136/112  Pulse: 76  Temp: 36.6 C  Resp: 18    Complications: No apparent anesthesia complications

## 2015-05-25 NOTE — Op Note (Signed)
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 Item Serial No. Manufacturer Lot No. LRB No. Used  LENS IOL ACRYSOF IQ 21.0 - [id number] Intraocular Lens LENS IOL ACRYSOF IQ 21.0 [id number] ALCON   Left 1   Anesthesia: 2% Lidocaine and 4% Marcaine in a 50/50 mixture with 10 unites/ml of Hylenex given as a peribulbar Anesthesiologist: No anesthesia staff entered. Complications: none Estimated Blood Loss: less than 1 ml  Description of procedure:  The patient was given anesthesia and sedation via intravenous access. The patient was then prepped and draped in the usual fashion. A 25-gauge needle was bent for initiating the capsulorhexis. A 5-0 silk suture was placed through the conjunctiva superior and inferiorly to serve as bridle sutures. Hemostasis was obtained at the superior limbus using an eraser cautery. A partial thickness groove was made at the anterior surgical limbus with a 64 Beaver blade and this was dissected anteriorly with an SYSCO. The anterior chamber was entered at 10 o'clock with a 1.0 mm paracentesis knife and through the lamellar dissection with a 2.6 mm Alcon keratome. Epi-Shugarcaine 0.5 CC [9 cc BSS Plus (Alcon), 3 cc 4% preservative-free lidocaine (Hospira) and 4 cc 1:1000 preservative-free, bisulfite-free epinephrine] was injected into the anterior chamber via the paracentesis tract. Epi-Shugarcaine 0.5 CC [9 cc BSS Plus (Alcon), 3 cc 4% preservative-free lidocaine (Hospira) and 4 cc 1:1000 preservative-free, bisulfite-free epinephrine] was injected into the anterior chamber via the paracentesis tract. DiscoVisc was injected to replace the aqueous and a continuous tear  curvilinear capsulorhexis was performed using a bent 25-gauge needle.  Balance salt on a syringe was used to perform hydro-dissection and phacoemulsification was carried out using a divide and conquer technique. Procedure(s) with comments: CATARACT EXTRACTION PHACO AND INTRAOCULAR LENS PLACEMENT (IOC) (Left) - [place]: 01:25.9 AP%: 26.3 CDE: 37.92  Lot # [id number]. Irrigation/aspiration was used to remove the residual cortex and the capsular bag was inflated with DiscoVisc. The intraocular lens was inserted into the capsular bag using a pre-loaded UltraSert Delivery System. Irrigation/aspiration was used to remove the residual DiscoVisc. The wound was inflated with balanced salt and checked for leaks. None were found. Miostat was injected via the paracentesis track and 0.1 ml of cefuroxime containing 1 mg of drug  was injected via the paracentesis track. The wound was checked for leaks again and none were found.   The bridal sutures were removed and two drops of Vigamox were placed on the eye. An eye shield was placed to protect the eye and the patient was discharged to the recovery area in good condition.   Shinika Estelle MD

## 2015-05-25 NOTE — Anesthesia Postprocedure Evaluation (Signed)
Anesthesia Post Note  Patient: Jennifer Knapp  Procedure(s) Performed: Procedure(s) (LRB): CATARACT EXTRACTION PHACO AND INTRAOCULAR LENS PLACEMENT (IOC) (Left)  Patient location during evaluation: Short Stay Anesthesia Type: MAC Level of consciousness: awake Pain management: pain level controlled Vital Signs Assessment: post-procedure vital signs reviewed and stable Respiratory status: spontaneous breathing Cardiovascular status: blood pressure returned to baseline Postop Assessment: no headache Anesthetic complications: no    Last Vitals:  Filed Vitals:   05/25/15 0914 05/25/15 1131  BP: 136/112 144/72  Pulse: 76 75  Temp: 36.6 C 36.7 C  Resp: 18 18    Last Pain: There were no vitals filed for this visit.               Vernie Murders

## 2015-05-25 NOTE — Discharge Instructions (Signed)
AMBULATORY SURGERY  DISCHARGE INSTRUCTIONS   1) The drugs that you were given will stay in your system until tomorrow so for the next 24 hours you should not:  A) Drive an automobile B) Make any legal decisions C) Drink any alcoholic beverage   2) You may resume regular meals tomorrow.  Today it is better to start with liquids and gradually work up to solid foods.  You may eat anything you prefer, but it is better to start with liquids, then soup and crackers, and gradually work up to solid foods.   3) Please notify your doctor immediately if you have any unusual bleeding, trouble breathing, redness and pain at the surgery site, drainage, fever, or pain not relieved by medication.    4) Additional Instructions:    Eye Surgery Discharge Instructions  Expect mild scratchy sensation or mild soreness. DO NOT RUB YOUR EYE!  The day of surgery:  Minimal physical activity, but bed rest is not required  No reading, computer work, or close hand work  No bending, lifting, or straining.  May watch TV  For 24 hours:  No driving, legal decisions, or alcoholic beverages  Safety precautions  Eat anything you prefer: It is better to start with liquids, then soup then solid foods.  _____ Eye patch should be worn until postoperative exam tomorrow.  ____ Solar shield eyeglasses should be worn for comfort in the sunlight/patch while sleeping  Resume all regular medications including aspirin or Coumadin if these were discontinued prior to surgery. You may shower, bathe, shave, or wash your hair. Tylenol may be taken for mild discomfort.  Call your doctor if you experience significant pain, nausea, or vomiting, fever > 101 or other signs of infection. [phone] or [phone] Specific instructions:  Follow-up Information    Follow up with Herminia Warren, MD In 1 day.   Specialty:  Ophthalmology   Why:  February 7 at 10:10am   Contact information:   [address]   [place] [address] [phone]         Please contact your physician with any problems or Same Day Surgery at [phone], Monday through Friday 6 am to 4 pm, or [place] at [facility] number at [phone].

## 2015-05-25 NOTE — Interval H&P Note (Signed)
History and Physical Interval Note:  05/25/2015 10:45 AM  Jennifer Knapp  has presented today for surgery, with the diagnosis of CATARACT  The various methods of treatment have been discussed with the patient and family. After consideration of risks, benefits and other options for treatment, the patient has consented to  Procedure(s): CATARACT EXTRACTION PHACO AND INTRAOCULAR LENS PLACEMENT (IOC) (Left) as a surgical intervention .  The patient's history has been reviewed, patient examined, no change in status, stable for surgery.  I have reviewed the patient's chart and labs.  Questions were answered to the patient's satisfaction.     Zephyr Ridley

## 2015-06-10 ENCOUNTER — Encounter: Payer: Self-pay | Admitting: Internal Medicine

## 2015-06-10 ENCOUNTER — Ambulatory Visit (INDEPENDENT_AMBULATORY_CARE_PROVIDER_SITE_OTHER): Payer: Medicare Other | Admitting: Internal Medicine

## 2015-06-10 VITALS — BP 144/84 | HR 80 | Ht 61.0 in | Wt 138.0 lb

## 2015-06-10 DIAGNOSIS — R42 Dizziness and giddiness: Secondary | ICD-10-CM | POA: Diagnosis not present

## 2015-06-10 NOTE — Patient Instructions (Addendum)
Can use Dramamine over the counter if needed for severe symptoms  Vertigo Vertigo means you feel like you or your surroundings are moving when they are not. Vertigo can be dangerous if it occurs when you are at work, driving, or performing difficult activities.  CAUSES  Vertigo occurs when there is a conflict of signals sent to your brain from the visual and sensory systems in your body. There are many different causes of vertigo, including:  Infections, especially in the inner ear.  A bad reaction to a drug or misuse of alcohol and medicines.  Withdrawal from drugs or alcohol.  Rapidly changing positions, such as lying down or rolling over in bed.  A migraine headache.  Decreased blood flow to the brain.  Increased pressure in the brain from a head injury, infection, tumor, or bleeding. SYMPTOMS  You may feel as though the world is spinning around or you are falling to the ground. Because your balance is upset, vertigo can cause nausea and vomiting. You may have involuntary eye movements (nystagmus). DIAGNOSIS  Vertigo is usually diagnosed by physical exam. If the cause of your vertigo is unknown, your caregiver may perform imaging tests, such as an MRI scan (magnetic resonance imaging). TREATMENT  Most cases of vertigo resolve on their own, without treatment. Depending on the cause, your caregiver may prescribe certain medicines. If your vertigo is related to body position issues, your caregiver may recommend movements or procedures to correct the problem. In rare cases, if your vertigo is caused by certain inner ear problems, you may need surgery. HOME CARE INSTRUCTIONS   Follow your caregiver's instructions.  Avoid driving.  Avoid operating heavy machinery.  Avoid performing any tasks that would be dangerous to you or others during a vertigo episode.  Tell your caregiver if you notice that certain medicines seem to be causing your vertigo. Some of the medicines used to treat  vertigo episodes can actually make them worse in some people. SEEK IMMEDIATE MEDICAL CARE IF:   Your medicines do not relieve your vertigo or are making it worse.  You develop problems with talking, walking, weakness, or using your arms, hands, or legs.  You develop severe headaches.  Your nausea or vomiting continues or gets worse.  You develop visual changes.  A family member notices behavioral changes.  Your condition gets worse. MAKE SURE YOU:  Understand these instructions.  Will watch your condition.  Will get help right away if you are not doing well or get worse.   This information is not intended to replace advice given to you by your health care provider. Make sure you discuss any questions you have with your health care provider.   Document Released: 01/12/2005 Document Revised: 06/27/2011 Document Reviewed: 07/28/2014 Elsevier Interactive Patient Education [employer]

## 2015-06-10 NOTE — Progress Notes (Signed)
Date:  06/10/2015   Name:  Jennifer Knapp   DOB:  October 22, 1940   MRN:  [id number]   Chief Complaint: Dizziness Dizziness This is a new problem. The current episode started today. The problem occurs constantly. The problem has been unchanged. Associated symptoms include vertigo. Pertinent negatives include no chills, congestion, coughing, fever, headaches, sore throat or vomiting. The symptoms are aggravated by twisting. She has tried nothing for the symptoms.    Review of Systems  Constitutional: Negative for fever and chills.  HENT: Negative for congestion and sore throat.   Respiratory: Negative for cough.   Gastrointestinal: Negative for vomiting.  Neurological: Positive for dizziness and vertigo. Negative for headaches.    Patient Active Problem List   Diagnosis Date Noted  . 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 Clinical depression 10/22/2014  . AB (asthmatic bronchitis) 10/22/2014  . Addison anemia 10/22/2014  . Hot flash, menopausal 10/22/2014  . Bronchitis 10/22/2014    Prior to Admission medications   Medication Sig Start Date End Date Taking? Authorizing Provider  ARIPiprazole (ABILIFY) 10 MG tablet Take 5 mg by mouth daily at 2 PM daily at 2 PM.   Yes Historical Provider, MD  diazepam (VALIUM) 5 MG tablet Take 1 tablet by mouth 2 (two) times daily.   Yes Historical Provider, MD  estradiol (ESTRACE) 0.5 MG tablet TAKE 1 TABLET ONCE DAILY 03/10/15  Yes Reubin Milan, MD  simvastatin (ZOCOR) 40 MG tablet TAKE 1 TABLET AT BEDTIME. 05/07/15  Yes Reubin Milan, MD  venlafaxine XR (EFFEXOR-XR) 150 MG 24 hr capsule Take 150 mg by mouth daily with breakfast.   Yes Historical Provider, MD    No Known Allergies  Past Surgical History  Procedure Laterality Date  . Appendectomy    . Abdominal hysterectomy    . Breast biopsy Right     x 2  . Cataract extraction w/phaco Left 05/25/2015    Procedure: CATARACT EXTRACTION PHACO AND INTRAOCULAR  LENS PLACEMENT (IOC);  Surgeon: Sallee Lange, MD;  Location: [facility] ORS;  Service: Ophthalmology;  Laterality: Left;  [place]: 01:25.9 AP%: 26.3 CDE: 37.92  Lot # [id number]    Social History  Substance Use Topics  . Smoking status: Former Games developer  . Smokeless tobacco: None  . Alcohol Use: 0.0 oz/week    0 Standard drinks or equivalent per week     Comment: occ.     Medication list has been reviewed and updated.   Physical Exam  Constitutional: She is oriented to person, place, and time. She appears well-developed and well-nourished.  Eyes: Pupils are equal, round, and reactive to light. Right eye exhibits nystagmus. Left eye exhibits nystagmus.  Neck: Normal range of motion. Neck supple. No thyromegaly present.  Cardiovascular: Normal rate, regular rhythm and normal heart sounds.   Pulmonary/Chest: Effort normal and breath sounds normal. No respiratory distress. She has no wheezes.  Lymphadenopathy:    She has no cervical adenopathy.  Neurological: She is alert and oriented to person, place, and time. She has normal strength and normal reflexes. No cranial nerve deficit or sensory deficit. She displays a negative Romberg sign.  Nursing note and vitals reviewed.   BP 144/84 mmHg  Pulse 80  Ht  (1.549 m)  Wt 138 lb (62.596 kg)  BMI 26.09 kg/m2  Assessment and Plan: 1. Vertigo Continue sufficient fluids Use dramamine otc if needed If persistent, will refer to ENT   Bari Edward, MD [facility]  06/10/2015

## 2015-06-17 ENCOUNTER — Other Ambulatory Visit: Payer: Self-pay | Admitting: Internal Medicine

## 2015-06-23 DIAGNOSIS — F331 Major depressive disorder, recurrent, moderate: Secondary | ICD-10-CM | POA: Diagnosis not present

## 2015-07-07 DIAGNOSIS — H2511 Age-related nuclear cataract, right eye: Secondary | ICD-10-CM | POA: Diagnosis not present

## 2015-07-14 ENCOUNTER — Encounter: Payer: Self-pay | Admitting: *Deleted

## 2015-07-19 NOTE — H&P (Signed)
See scanned note.

## 2015-07-20 ENCOUNTER — Ambulatory Visit: Payer: Medicare Other | Admitting: Anesthesiology

## 2015-07-20 ENCOUNTER — Encounter: Payer: Self-pay | Admitting: Anesthesiology

## 2015-07-20 ENCOUNTER — Ambulatory Visit
Admission: RE | Admit: 2015-07-20 | Discharge: 2015-07-20 | Disposition: A | Discharge status: Home / Self Care | Payer: Medicare Other | Source: Ambulatory Visit | Attending: Ophthalmology | Admitting: Ophthalmology

## 2015-07-20 ENCOUNTER — Encounter
Admission: RE | Disposition: A | Discharge status: Home / Self Care | Payer: Self-pay | Source: Ambulatory Visit | Attending: Ophthalmology

## 2015-07-20 DIAGNOSIS — Z90710 Acquired absence of both cervix and uterus: Secondary | ICD-10-CM | POA: Diagnosis not present

## 2015-07-20 DIAGNOSIS — F329 Major depressive disorder, single episode, unspecified: Secondary | ICD-10-CM | POA: Diagnosis not present

## 2015-07-20 DIAGNOSIS — Z9842 Cataract extraction status, left eye: Secondary | ICD-10-CM | POA: Diagnosis not present

## 2015-07-20 DIAGNOSIS — F419 Anxiety disorder, unspecified: Secondary | ICD-10-CM | POA: Diagnosis not present

## 2015-07-20 DIAGNOSIS — E7800 Pure hypercholesterolemia, unspecified: Secondary | ICD-10-CM | POA: Diagnosis not present

## 2015-07-20 DIAGNOSIS — H2511 Age-related nuclear cataract, right eye: Secondary | ICD-10-CM | POA: Diagnosis not present

## 2015-07-20 DIAGNOSIS — H25011 Cortical age-related cataract, right eye: Secondary | ICD-10-CM | POA: Insufficient documentation

## 2015-07-20 DIAGNOSIS — Z87891 Personal history of nicotine dependence: Secondary | ICD-10-CM | POA: Insufficient documentation

## 2015-07-20 DIAGNOSIS — J45909 Unspecified asthma, uncomplicated: Secondary | ICD-10-CM | POA: Diagnosis not present

## 2015-07-20 DIAGNOSIS — H269 Unspecified cataract: Secondary | ICD-10-CM | POA: Diagnosis present

## 2015-07-20 DIAGNOSIS — Z79899 Other long term (current) drug therapy: Secondary | ICD-10-CM | POA: Insufficient documentation

## 2015-07-20 HISTORY — DX: Unspecified asthma, uncomplicated: J45.909

## 2015-07-20 HISTORY — PX: CATARACT EXTRACTION W/PHACO: SHX586

## 2015-07-20 HISTORY — DX: Bronchitis, not specified as acute or chronic: J40

## 2015-07-20 SURGERY — PHACOEMULSIFICATION, CATARACT, WITH IOL INSERTION
Anesthesia: Monitor Anesthesia Care | Laterality: Right | Wound class: Clean

## 2015-07-20 MED ORDER — CARBACHOL 0.01 % IO SOLN
INTRAOCULAR | Status: DC | PRN
  Administered 2015-07-20: 0.5 mL via INTRAOCULAR

## 2015-07-20 MED ORDER — POVIDONE-IODINE 5 % OP SOLN
OPHTHALMIC | Status: AC
  Filled 2015-07-20: qty 30

## 2015-07-20 MED ORDER — CEFUROXIME OPHTHALMIC INJECTION 1 MG/0.1 ML
INJECTION | OPHTHALMIC | Status: AC
  Filled 2015-07-20: qty 0.1

## 2015-07-20 MED ORDER — MOXIFLOXACIN HCL 0.5 % OP SOLN
OPHTHALMIC | Status: AC
  Administered 2015-07-20: 1 [drp] via OPHTHALMIC
  Filled 2015-07-20: qty 3

## 2015-07-20 MED ORDER — CYCLOPENTOLATE HCL 2 % OP SOLN
1.0000 [drp] | OPHTHALMIC | Status: AC | PRN
  Administered 2015-07-20 (×4): 1 [drp] via OPHTHALMIC

## 2015-07-20 MED ORDER — HYALURONIDASE HUMAN 150 UNIT/ML IJ SOLN
INTRAMUSCULAR | Status: AC
  Filled 2015-07-20: qty 1

## 2015-07-20 MED ORDER — PHENYLEPHRINE HCL 10 % OP SOLN
1.0000 [drp] | OPHTHALMIC | Status: AC | PRN
  Administered 2015-07-20 (×4): 1 [drp] via OPHTHALMIC

## 2015-07-20 MED ORDER — NA CHONDROIT SULF-NA HYALURON 40-17 MG/ML IO SOLN
INTRAOCULAR | Status: AC
  Filled 2015-07-20: qty 1

## 2015-07-20 MED ORDER — MIDAZOLAM HCL 5 MG/5ML IJ SOLN
INTRAMUSCULAR | Status: DC | PRN
  Administered 2015-07-20: 1 mg via INTRAVENOUS

## 2015-07-20 MED ORDER — LIDOCAINE HCL (PF) 4 % IJ SOLN
INTRAOCULAR | Status: DC | PRN
  Administered 2015-07-20: .5 mL via OPHTHALMIC

## 2015-07-20 MED ORDER — EPINEPHRINE HCL 1 MG/ML IJ SOLN
INTRAOCULAR | Status: DC | PRN
  Administered 2015-07-20: 250 mL via OPHTHALMIC

## 2015-07-20 MED ORDER — POVIDONE-IODINE 5 % OP SOLN
OPHTHALMIC | Status: DC | PRN
  Administered 2015-07-20: 1 via OPHTHALMIC

## 2015-07-20 MED ORDER — LIDOCAINE HCL (PF) 4 % IJ SOLN
INTRAMUSCULAR | Status: AC
  Filled 2015-07-20: qty 5

## 2015-07-20 MED ORDER — ALFENTANIL 500 MCG/ML IJ INJ
INJECTION | INTRAMUSCULAR | Status: DC | PRN
  Administered 2015-07-20: 500 ug via INTRAVENOUS

## 2015-07-20 MED ORDER — CEFUROXIME OPHTHALMIC INJECTION 1 MG/0.1 ML
INJECTION | OPHTHALMIC | Status: DC | PRN
  Administered 2015-07-20: 0.1 mL via INTRACAMERAL

## 2015-07-20 MED ORDER — BUPIVACAINE HCL (PF) 0.75 % IJ SOLN
INTRAMUSCULAR | Status: AC
  Filled 2015-07-20: qty 10

## 2015-07-20 MED ORDER — MOXIFLOXACIN HCL 0.5 % OP SOLN
1.0000 [drp] | OPHTHALMIC | Status: AC | PRN
  Administered 2015-07-20 (×3): 1 [drp] via OPHTHALMIC

## 2015-07-20 MED ORDER — PHENYLEPHRINE HCL 10 % OP SOLN
OPHTHALMIC | Status: AC
  Administered 2015-07-20: 1 [drp] via OPHTHALMIC
  Filled 2015-07-20: qty 5

## 2015-07-20 MED ORDER — CYCLOPENTOLATE HCL 2 % OP SOLN
OPHTHALMIC | Status: AC
  Administered 2015-07-20: 1 [drp] via OPHTHALMIC
  Filled 2015-07-20: qty 2

## 2015-07-20 MED ORDER — EPINEPHRINE HCL 1 MG/ML IJ SOLN
INTRAMUSCULAR | Status: AC
  Filled 2015-07-20: qty 2

## 2015-07-20 MED ORDER — TETRACAINE HCL 0.5 % OP SOLN
OPHTHALMIC | Status: AC
Start: 2015-07-20 — End: 2015-07-20
  Filled 2015-07-20: qty 2

## 2015-07-20 MED ORDER — NA CHONDROIT SULF-NA HYALURON 40-17 MG/ML IO SOLN
INTRAOCULAR | Status: DC | PRN
  Administered 2015-07-20: 1 mL via INTRAOCULAR

## 2015-07-20 MED ORDER — SODIUM CHLORIDE 0.9 % IV SOLN
INTRAVENOUS | Status: DC
  Administered 2015-07-20: 09:00:00 via INTRAVENOUS

## 2015-07-20 MED ORDER — LIDOCAINE HCL (PF) 4 % IJ SOLN
INTRAMUSCULAR | Status: DC | PRN
  Administered 2015-07-20: 5 mL via OPHTHALMIC

## 2015-07-20 MED ORDER — MOXIFLOXACIN HCL 0.5 % OP SOLN
OPHTHALMIC | Status: DC | PRN
  Administered 2015-07-20: 2 [drp] via OPHTHALMIC

## 2015-07-20 MED ORDER — TETRACAINE HCL 0.5 % OP SOLN
OPHTHALMIC | Status: DC | PRN
  Administered 2015-07-20: 2 [drp] via OPHTHALMIC

## 2015-07-20 SURGICAL SUPPLY — 29 items
CANNULA ANT/CHMB 27GA (MISCELLANEOUS) ×2 IMPLANT
CORD BIP STRL DISP 12FT (MISCELLANEOUS) ×2 IMPLANT
CUP MEDICINE 2OZ PLAST GRAD ST (MISCELLANEOUS) ×2 IMPLANT
DRAPE XRAY CASSETTE 23X24 (DRAPES) ×2 IMPLANT
ERASER HMR WETFIELD 18G (MISCELLANEOUS) ×2 IMPLANT
GLOVE BIO SURGEON STRL SZ8 (GLOVE) ×2 IMPLANT
GLOVE SURG LX 6.5 MICRO (GLOVE) ×1
GLOVE SURG LX 8.0 MICRO (GLOVE) ×1
GLOVE SURG LX STRL 6.5 MICRO (GLOVE) ×1 IMPLANT
GLOVE SURG LX STRL 8.0 MICRO (GLOVE) ×1 IMPLANT
GOWN STRL REUS W/ TWL LRG LVL3 (GOWN DISPOSABLE) ×1 IMPLANT
GOWN STRL REUS W/ TWL XL LVL3 (GOWN DISPOSABLE) ×1 IMPLANT
GOWN STRL REUS W/TWL LRG LVL3 (GOWN DISPOSABLE) ×1
GOWN STRL REUS W/TWL XL LVL3 (GOWN DISPOSABLE) ×1
LENS IOL ACRYSOF IQ 20.5 (Intraocular Lens) ×2 IMPLANT
PACK CATARACT (MISCELLANEOUS) ×2 IMPLANT
PACK CATARACT DINGLEDEIN LX (MISCELLANEOUS) ×2 IMPLANT
PACK EYE AFTER SURG (MISCELLANEOUS) ×2 IMPLANT
SHLD EYE VISITEC  UNIV (MISCELLANEOUS) ×2 IMPLANT
SOL BSS BAG (MISCELLANEOUS) ×2
SOL PREP PVP 2OZ (MISCELLANEOUS) ×2
SOLUTION BSS BAG (MISCELLANEOUS) ×1 IMPLANT
SOLUTION PREP PVP 2OZ (MISCELLANEOUS) ×1 IMPLANT
SUT SILK 5-0 (SUTURE) ×2 IMPLANT
SYR 3ML LL SCALE MARK (SYRINGE) ×2 IMPLANT
SYR 5ML LL (SYRINGE) ×2 IMPLANT
SYR TB 1ML 27GX1/2 LL (SYRINGE) ×2 IMPLANT
WATER STERILE IRR 1000ML POUR (IV SOLUTION) ×2 IMPLANT
WIPE NON LINTING 3.25X3.25 (MISCELLANEOUS) ×2 IMPLANT

## 2015-07-20 NOTE — Op Note (Signed)
Date of Surgery: 07/20/2015 Date of Dictation: 07/20/2015 10:24 AM Pre-operative Diagnosis:  Nuclear Sclerotic Cataract and Cortical Cataract right Eye Post-operative Diagnosis: same Procedure performed: Extra-capsular Cataract Extraction (ECCE) with placement of a posterior chamber intraocular lens (IOL) right Eye IOL:  Implant Name Type Inv. Item Serial No. Manufacturer Lot No. LRB No. Used  LENS IOL ACRYSOF IQ 20.5 - [id number] Intraocular Lens LENS IOL ACRYSOF IQ 20.5 [id number] ALCON   Right 1   Anesthesia: 2% Lidocaine and 4% Marcaine in a 50/50 mixture with 10 unites/ml of Hylenex given as a peribulbar Anesthesiologist: Anesthesiologist: Berdine AddisonMathai Thomas, MD CRNA: Michaele OfferKasey Savage, CRNA Complications: none Estimated Blood Loss: less than 1 ml  Description of procedure:  The patient was given anesthesia and sedation via intravenous access. The patient was then prepped and draped in the usual fashion. A 25-gauge needle was bent for initiating the capsulorhexis. A 5-0 silk suture was placed through the conjunctiva superior and inferiorly to serve as bridle sutures. Hemostasis was obtained at the superior limbus using an eraser cautery. A partial thickness groove was made at the anterior surgical limbus with a 64 Beaver blade and this was dissected anteriorly with an SYSC[employer]con [place]cent knife. The anterior chamber was entered at 10 o'clock with a 1.0 mm paracentesis knife and through the lamellar dissection with a 2.6 mm Alcon keratome. Epi-Shugarcaine 0.5 CC [9 cc BSS Plus (Alcon), 3 cc 4% preservative-free lidocaine (Hospira) and 4 cc 1:1000 preservative-free, bisulfite-free epinephrine] was injected into the anterior chamber via the paracentesis tract. Epi-Shugarcaine 0.5 CC [9 cc BSS Plus (Alcon), 3 cc 4% preservative-free lidocaine (Hospira) and 4 cc 1:1000 preservative-free, bisulfite-free epinephrine] was injected into the anterior chamber via the paracentesis tract. DiscoVisc was injected to replace  the aqueous and a continuous tear curvilinear capsulorhexis was performed using a bent 25-gauge needle.  Balance salt on a syringe was used to perform hydro-dissection and phacoemulsification was carried out using a divide and conquer technique. Procedure(s) with comments: CATARACT EXTRACTION PHACO AND INTRAOCULAR LENS PLACEMENT (IOC) (Right) - US      1:12.7 AP%    25.3 CDE    29.53 fluid casette lot # [id number]  exp 09/15/2016. Irrigation/aspiration was used to remove the residual cortex and the capsular bag was inflated with DiscoVisc. The intraocular lens was inserted into the capsular bag using a pre-loaded UltraSert Delivery System. Irrigation/aspiration was used to remove the residual DiscoVisc. The wound was inflated with balanced salt and checked for leaks. None were found. Miostat was injected via the paracentesis track and 0.1 ml of cefuroxime containing 1 mg of drug  was injected via the paracentesis track. The wound was checked for leaks again and none were found.   The bridal sutures were removed and two drops of Vigamox were placed on the eye. An eye shield was placed to protect the eye and the patient was discharged to the recovery area in good condition.   Hayle Parisi MD

## 2015-07-20 NOTE — Anesthesia Postprocedure Evaluation (Signed)
Anesthesia Post Note  Patient: Jennifer KitchensBobbie Collene SchlichterJean Knapp  Procedure(s) Performed: Procedure(s) (LRB): CATARACT EXTRACTION PHACO AND INTRAOCULAR LENS PLACEMENT (IOC) (Right)  Patient location during evaluation: PACU Anesthesia Type: MAC Level of consciousness: awake and alert and oriented Pain management: satisfactory to patient Vital Signs Assessment: post-procedure vital signs reviewed and stable Respiratory status: respiratory function stable Cardiovascular status: stable Anesthetic complications: no    Last Vitals:  Filed Vitals:   07/20/15 0838  BP: 149/69  Pulse: 74  Temp: 37.2 C  Resp: 16    Last Pain:  Filed Vitals:   07/20/15 0840  PainSc: 0-No pain                 Michaele OfferSavage,  Zaki Gertsch A

## 2015-07-20 NOTE — Transfer of Care (Signed)
Immediate Anesthesia Transfer of Care Note  Patient: Karen KitchensBobbie Collene SchlichterJean Dolle  Procedure(s) Performed: Procedure(s) with comments: CATARACT EXTRACTION PHACO AND INTRAOCULAR LENS PLACEMENT (IOC) (Right) - US      1:12.7 AP%    25.3 CDE    29.53 fluid casette lot # [id number]  exp 09/15/2016  Patient Location: PACU  Anesthesia Type:MAC  Level of Consciousness: awake, alert , oriented and patient cooperative  Airway & Oxygen Therapy: Patient Spontanous Breathing  Post-op Assessment: Report given to RN, Post -op Vital signs reviewed and stable and Patient moving all extremities X 4  Post vital signs: Reviewed and stable  Last Vitals:  Filed Vitals:   07/20/15 0838  BP: 149/69  Pulse: 74  Temp: 37.2 C  Resp: 16    Complications: No apparent anesthesia complications

## 2015-07-20 NOTE — Interval H&P Note (Signed)
History and Physical Interval Note:  07/20/2015 9:46 AM  Jennifer KitchensBobbie Collene SchlichterJean Knapp  has presented today for surgery, with the diagnosis of CATARACT  The various methods of treatment have been discussed with the patient and family. After consideration of risks, benefits and other options for treatment, the patient has consented to  Procedure(s): CATARACT EXTRACTION PHACO AND INTRAOCULAR LENS PLACEMENT (IOC) (Right) as a surgical intervention .  The patient's history has been reviewed, patient examined, no change in status, stable for surgery.  I have reviewed the patient's chart and labs.  Questions were answered to the patient's satisfaction.     Tawyna Pellot

## 2015-07-20 NOTE — Discharge Instructions (Addendum)
See handout. AMBULATORY SURGERY  DISCHARGE INSTRUCTIONS   1) The drugs that you were given will stay in your system until tomorrow so for the next 24 hours you should not:  A) Drive an automobile B) Make any legal decisions C) Drink any alcoholic beverage   2) You may resume regular meals tomorrow.  Today it is better to start with liquids and gradually work up to solid foods.  You may eat anything you prefer, but it is better to start with liquids, then soup and crackers, and gradually work up to solid foods.   3) Please notify your doctor immediately if you have any unusual bleeding, trouble breathing, redness and pain at the surgery site, drainage, fever, or pain not relieved by medication.    4) Additional Instructions:    Eye Surgery Discharge Instructions  Expect mild scratchy sensation or mild soreness. DO NOT RUB YOUR EYE!  The day of surgery:  Minimal physical activity, but bed rest is not required  No reading, computer work, or close hand work  No bending, lifting, or straining.  May watch TV  For 24 hours:  No driving, legal decisions, or alcoholic beverages  Safety precautions  Eat anything you prefer: It is better to start with liquids, then soup then solid foods.  _____ Eye patch should be worn until postoperative exam tomorrow.  ____ Solar shield eyeglasses should be worn for comfort in the sunlight/patch while sleeping  Resume all regular medications including aspirin or Coumadin if these were discontinued prior to surgery. You may shower, bathe, shave, or wash your hair. Tylenol may be taken for mild discomfort.  Call your doctor if you experience significant pain, nausea, or vomiting, fever > 101 or other signs of infection. [phone] or [phone] Specific instructions:  Follow-up Information    Follow up with DINGELDEIN,STEVEN, MD In 1 day.   Specialty:  Ophthalmology   Why:  April 4 at 10:45am   Contact information:   [address]   [place] [address] [phone]         Please contact your physician with any problems or Same Day Surgery at [phone], Monday through Friday 6 am to 4 pm, or [place] at [facility] Main number at [phone].

## 2015-07-20 NOTE — Anesthesia Preprocedure Evaluation (Addendum)
Anesthesia Evaluation  Patient identified by MRN, date of birth, ID band Patient awake    Reviewed: Allergy & Precautions, NPO status , Patient's Chart, lab work & pertinent test results, reviewed documented beta blocker date and time   Airway Mallampati: II  TM Distance: >3 FB     Dental  (+) Chipped, Upper Dentures   Pulmonary asthma , former smoker,           Cardiovascular      Neuro/Psych PSYCHIATRIC DISORDERS Depression    GI/Hepatic   Endo/Other    Renal/GU      Musculoskeletal   Abdominal   Peds  Hematology   Anesthesia Other Findings   Reproductive/Obstetrics                            Anesthesia Physical Anesthesia Plan  ASA: III  Anesthesia Plan: MAC   Post-op Pain Management:    Induction:   Airway Management Planned:   Additional Equipment:   Intra-op Plan:   Post-operative Plan:   Informed Consent: I have reviewed the patients History and Physical, chart, labs and discussed the procedure including the risks, benefits and alternatives for the proposed anesthesia with the patient or authorized representative who has indicated his/her understanding and acceptance.     Plan Discussed with: CRNA  Anesthesia Plan Comments:         Anesthesia Quick Evaluation

## 2015-08-20 ENCOUNTER — Other Ambulatory Visit: Payer: Self-pay | Admitting: Internal Medicine

## 2015-10-16 ENCOUNTER — Other Ambulatory Visit: Payer: Self-pay | Admitting: Internal Medicine

## 2015-11-10 ENCOUNTER — Ambulatory Visit: Payer: Medicare Other | Admitting: Internal Medicine

## 2015-11-26 ENCOUNTER — Ambulatory Visit (INDEPENDENT_AMBULATORY_CARE_PROVIDER_SITE_OTHER): Payer: Medicare Other | Admitting: Internal Medicine

## 2015-11-26 ENCOUNTER — Encounter: Payer: Self-pay | Admitting: Internal Medicine

## 2015-11-26 VITALS — BP 132/81 | HR 82 | Resp 16 | Ht 61.0 in | Wt 144.6 lb

## 2015-11-26 DIAGNOSIS — Z1231 Encounter for screening mammogram for malignant neoplasm of breast: Secondary | ICD-10-CM

## 2015-11-26 DIAGNOSIS — F32A Depression, unspecified: Secondary | ICD-10-CM

## 2015-11-26 DIAGNOSIS — E784 Other hyperlipidemia: Secondary | ICD-10-CM

## 2015-11-26 DIAGNOSIS — N951 Menopausal and female climacteric states: Secondary | ICD-10-CM | POA: Diagnosis not present

## 2015-11-26 DIAGNOSIS — F329 Major depressive disorder, single episode, unspecified: Secondary | ICD-10-CM | POA: Diagnosis not present

## 2015-11-26 DIAGNOSIS — E7849 Other hyperlipidemia: Secondary | ICD-10-CM

## 2015-11-26 NOTE — Progress Notes (Signed)
Date:  11/26/2015   Name:  Jennifer Knapp   DOB:  02-19-1941   MRN:  [id number]   Chief Complaint: Dizziness (6 mo fu ) Health Maintenance - patient has not had a mammogram in several years.  She denies breast problems but does not perform self exams.   Dizziness  The problem has been resolved. Pertinent negatives include no abdominal pain, arthralgias, chest pain, chills, coughing, fever or headaches.  Hyperlipidemia  This is a chronic problem. The problem is controlled. Pertinent negatives include no chest pain or shortness of breath. Current antihyperlipidemic treatment includes statins.  Depression         This is a chronic (followed by Psychiatry) problem.  Associated symptoms include no headaches.  Compliance with treatment is good.  Previous treatment provided significant relief.    Review of Systems  Constitutional: Negative for chills and fever.  Eyes: Negative for visual disturbance.  Respiratory: Negative for cough, chest tightness, shortness of breath and wheezing.   Cardiovascular: Negative for chest pain, palpitations and leg swelling.  Gastrointestinal: Negative for abdominal pain.  Genitourinary: Negative for dysuria.  Musculoskeletal: Negative for arthralgias.  Neurological: Negative for dizziness, light-headedness and headaches.  Psychiatric/Behavioral: Positive for depression.    Patient Active Problem List   Diagnosis Date Noted  . 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 Clinical depression 10/22/2014  . AB (asthmatic bronchitis) 10/22/2014  . Addison anemia 10/22/2014  . Hot flash, menopausal 10/22/2014  . Bronchitis 10/22/2014    Prior to Admission medications   Medication Sig Start Date End Date Taking? Authorizing Provider  ARIPiprazole (ABILIFY) 10 MG tablet Take 5 mg by mouth daily at 2 PM daily at 2 PM.   Yes Historical Provider, MD  diazepam (VALIUM) 5 MG tablet Take 1 tablet by mouth daily. AM   Yes Historical  Provider, MD  estradiol (ESTRACE) 0.5 MG tablet TAKE 1 TABLET ONCE DAILY 10/16/15  Yes Reubin Milan, MD  simvastatin (ZOCOR) 40 MG tablet TAKE 1 TABLET AT BEDTIME. 08/20/15  Yes Reubin Milan, MD  venlafaxine XR (EFFEXOR-XR) 150 MG 24 hr capsule Take 150 mg by mouth daily with breakfast.   Yes Historical Provider, MD    No Known Allergies  Past Surgical History:  Procedure Laterality Date  . ABDOMINAL HYSTERECTOMY    . APPENDECTOMY    . BREAST BIOPSY Right    x 2  . CATARACT EXTRACTION W/PHACO Left 05/25/2015   Procedure: CATARACT EXTRACTION PHACO AND INTRAOCULAR LENS PLACEMENT (IOC);  Surgeon: Sallee Lange, MD;  Location: [facility] ORS;  Service: Ophthalmology;  Laterality: Left;  [place]: 01:25.9 AP%: 26.3 CDE: 37.92  Lot # [id number]  . CATARACT EXTRACTION W/PHACO Right 07/20/2015   Procedure: CATARACT EXTRACTION PHACO AND INTRAOCULAR LENS PLACEMENT (IOC);  Surgeon: Sallee Lange, MD;  Location: [facility] ORS;  Service: Ophthalmology;  Laterality: Right;  [place]      1:12.7 AP%    25.3 CDE    29.53 fluid casette lot # [id number] H  exp 09/15/2016    Social History  Substance Use Topics  . Smoking status: Former Games developer  . Smokeless tobacco: Never Used  . Alcohol use 0.0 oz/week     Comment: occ.     Medication list has been reviewed and updated.   Physical Exam  Constitutional: She is oriented to person, place, and time. She appears well-developed. No distress.  HENT:  Head: Normocephalic and atraumatic.  Neck: Normal range of motion. Carotid bruit is not present.  Cardiovascular: Normal  rate, regular rhythm and normal heart sounds.   Pulmonary/Chest: Effort normal. No respiratory distress.  Musculoskeletal: Normal range of motion. She exhibits no edema or tenderness.  Neurological: She is alert and oriented to person, place, and time.  Skin: Skin is warm and dry. No rash noted.  Psychiatric: She has a normal mood and affect. Her speech is normal and behavior is normal. Thought  content normal.  Nursing note and vitals reviewed.   BP 132/81 (BP Location: Right Arm, Patient Position: Sitting, Cuff Size: Normal)   Pulse 82   Resp 16   Ht 5\' 1"  (1.549 m)   Wt 144 lb 9.6 oz (65.6 kg)   SpO2 99%   BMI 27.32 kg/m   Assessment and Plan: 1. Encounter for screening mammogram for breast cancer Encouraged pt to schedule annual exam - MM DIGITAL SCREENING BILATERAL; Future  2. Familial multiple lipoprotein-type hyperlipidemia Continue low fat diet and statin  3. Clinical depression stable  4. Hot flash, menopausal On estrogen - stressed the importance of screening mammograms   Bari EdwardLaura Yonah Tangeman, MD [facility]  11/26/2015

## 2015-12-22 DIAGNOSIS — F331 Major depressive disorder, recurrent, moderate: Secondary | ICD-10-CM | POA: Diagnosis not present

## 2015-12-30 ENCOUNTER — Ambulatory Visit
Admission: RE | Admit: 2015-12-30 | Discharge: 2015-12-30 | Disposition: A | Discharge status: Home / Self Care | Payer: Medicare Other | Source: Ambulatory Visit | Attending: Internal Medicine | Admitting: Internal Medicine

## 2015-12-30 ENCOUNTER — Other Ambulatory Visit: Payer: Self-pay | Admitting: Internal Medicine

## 2015-12-30 DIAGNOSIS — Z1231 Encounter for screening mammogram for malignant neoplasm of breast: Secondary | ICD-10-CM | POA: Diagnosis not present

## 2016-02-29 ENCOUNTER — Other Ambulatory Visit: Payer: Self-pay | Admitting: Internal Medicine

## 2016-04-07 DIAGNOSIS — Z961 Presence of intraocular lens: Secondary | ICD-10-CM | POA: Diagnosis not present

## 2016-04-19 ENCOUNTER — Encounter: Payer: Self-pay | Admitting: Internal Medicine

## 2016-04-19 ENCOUNTER — Ambulatory Visit (INDEPENDENT_AMBULATORY_CARE_PROVIDER_SITE_OTHER): Payer: Medicare Other | Admitting: Internal Medicine

## 2016-04-19 VITALS — BP 132/80 | HR 92 | Temp 97.6°F | Ht 61.0 in | Wt 142.0 lb

## 2016-04-19 DIAGNOSIS — J40 Bronchitis, not specified as acute or chronic: Secondary | ICD-10-CM

## 2016-04-19 MED ORDER — GUAIFENESIN-CODEINE 100-10 MG/5ML PO SYRP: 5.0000 mL | ORAL_SOLUTION | Freq: Three times a day (TID) | ORAL | 0 refills | Status: DC | PRN

## 2016-04-19 MED ORDER — AMOXICILLIN-POT CLAVULANATE 875-125 MG PO TABS: 1.0000 | ORAL_TABLET | Freq: Two times a day (BID) | ORAL | 0 refills | Status: DC

## 2016-04-19 NOTE — Progress Notes (Signed)
Date:  04/19/2016   Name:  Jennifer KerbsBobbie [name]   DOB:  12-Jan-1941   MRN:  [id number]   Chief Complaint: Cough (pt stated coughing green mucus) Cough  This is a new problem. The current episode started in the past 7 days. The problem has been gradually worsening. The problem occurs every few minutes. The cough is productive of purulent sputum. Associated symptoms include a sore throat. Pertinent negatives include no chest pain, chills, ear pain, fever, postnasal drip or wheezing.      Review of Systems  Constitutional: Positive for fatigue. Negative for chills and fever.  HENT: Positive for sore throat and voice change. Negative for ear pain, postnasal drip, sinus pressure and trouble swallowing.   Eyes: Negative for visual disturbance.  Respiratory: Positive for cough. Negative for chest tightness and wheezing.   Cardiovascular: Negative for chest pain.  Gastrointestinal: Negative for abdominal pain, diarrhea and nausea.    Patient Active Problem List   Diagnosis Date Noted  . 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 Clinical depression 10/22/2014  . AB (asthmatic bronchitis) 10/22/2014  . Addison anemia 10/22/2014  . Hot flash, menopausal 10/22/2014    Prior to Admission medications   Medication Sig Start Date End Date Taking? Authorizing Provider  ARIPiprazole (ABILIFY) 10 MG tablet Take 5 mg by mouth daily at 2 PM daily at 2 PM.   Yes Historical Provider, MD  diazepam (VALIUM) 5 MG tablet Take 1 tablet by mouth daily. AM   Yes Historical Provider, MD  estradiol (ESTRACE) 0.5 MG tablet TAKE 1 TABLET ONCE DAILY 10/16/15  Yes Reubin MilanLaura H Ahmed Inniss, MD  simvastatin (ZOCOR) 40 MG tablet TAKE 1 TABLET BY MOUTH ONCE DAILY. 02/29/16  Yes Reubin MilanLaura H Marlyss Cissell, MD  venlafaxine XR (EFFEXOR-XR) 150 MG 24 hr capsule Take 150 mg by mouth daily with breakfast.   Yes Historical Provider, MD    No Known Allergies  Past Surgical History:  Procedure Laterality Date  .  ABDOMINAL HYSTERECTOMY    . APPENDECTOMY    . BREAST BIOPSY Left    x2-neg  . CATARACT EXTRACTION W/PHACO Left 05/25/2015   Procedure: CATARACT EXTRACTION PHACO AND INTRAOCULAR LENS PLACEMENT (IOC);  Surgeon: Sallee LangeSteven Dingeldein, MD;  Location: [facility] ORS;  Service: Ophthalmology;  Laterality: Left;  [place]: 01:25.9 AP%: 26.3 CDE: 37.92  Lot # [id number] H  . CATARACT EXTRACTION W/PHACO Right 07/20/2015   Procedure: CATARACT EXTRACTION PHACO AND INTRAOCULAR LENS PLACEMENT (IOC);  Surgeon: Sallee LangeSteven Dingeldein, MD;  Location: [facility] ORS;  Service: Ophthalmology;  Laterality: Right;  [place]      1:12.7 AP%    25.3 CDE    29.53 fluid casette lot # [id number] H  exp 09/15/2016    Social History  Substance Use Topics  . Smoking status: Former Games developermoker  . Smokeless tobacco: Never Used  . Alcohol use 0.0 oz/week     Comment: occ.     Medication list has been reviewed and updated.   Physical Exam  Constitutional: She is oriented to person, place, and time. She appears well-developed. No distress.  HENT:  Head: Normocephalic and atraumatic.  Right Ear: Tympanic membrane and ear canal normal.  Left Ear: Tympanic membrane and ear canal normal.  Mouth/Throat: Posterior oropharyngeal erythema present.  Cardiovascular: Normal rate, regular rhythm and normal heart sounds.   Pulmonary/Chest: Effort normal. No respiratory distress. She has decreased breath sounds. She has no wheezes. She has no rhonchi.  Musculoskeletal: Normal range of motion.  Neurological: She is alert and oriented to  person, place, and time.  Skin: Skin is warm and dry. No rash noted.  Psychiatric: She has a normal mood and affect. Her behavior is normal. Thought content normal.  Nursing note and vitals reviewed.   BP 132/80   Pulse 92   Temp 97.6 F (36.4 C)   Ht 5\' 1"  (1.549 m)   Wt 142 lb (64.4 kg)   SpO2 97%   BMI 26.83 kg/m   Assessment and Plan: 1. Bronchitis Continue fluids and rest Mucinex during the day; robitussin AC at  night - amoxicillin-clavulanate (AUGMENTIN) 875-125 MG tablet; Take 1 tablet by mouth 2 (two) times daily.  Dispense: 20 tablet; Refill: 0 - guaiFENesin-codeine (ROBITUSSIN AC) 100-10 MG/5ML syrup; Take 5 mLs by mouth 3 (three) times daily as needed for cough.  Dispense: 150 mL; Refill: 0   Bari Edward, MD [facility]  04/19/2016

## 2016-05-10 ENCOUNTER — Ambulatory Visit (INDEPENDENT_AMBULATORY_CARE_PROVIDER_SITE_OTHER): Payer: Medicare Other | Admitting: Internal Medicine

## 2016-05-10 ENCOUNTER — Encounter: Payer: Self-pay | Admitting: Internal Medicine

## 2016-05-10 VITALS — BP 128/82 | HR 101 | Temp 97.6°F | Ht 61.0 in | Wt 143.0 lb

## 2016-05-10 DIAGNOSIS — N3001 Acute cystitis with hematuria: Secondary | ICD-10-CM

## 2016-05-10 LAB — POCT URINALYSIS DIPSTICK
Bilirubin, UA: NEGATIVE
Nitrite, UA: POSITIVE
Spec Grav, UA: <=1.005
Urobilinogen, UA: >=8
pH, UA: 5

## 2016-05-10 MED ORDER — CIPROFLOXACIN HCL 250 MG PO TABS: 250.0000 mg | ORAL_TABLET | Freq: Two times a day (BID) | ORAL | 0 refills | Status: DC

## 2016-05-10 NOTE — Progress Notes (Signed)
Date:  05/10/2016   Name:  Jennifer KerbsBobbie Jean Ursin   DOB:  August 04, 1940   MRN:  [id number]   Chief Complaint: Urinary Tract Infection Urinary Tract Infection   This is a new problem. The current episode started in the past 7 days. The problem occurs every urination. The problem has been unchanged. The quality of the pain is described as burning. The pain is mild. There has been no fever. Associated symptoms include chills, frequency, hesitancy, nausea and urgency. Pertinent negatives include no flank pain, hematuria, sweats or vomiting. She has tried increased fluids for the symptoms. The treatment provided mild relief.      Review of Systems  Constitutional: Positive for chills.  Respiratory: Negative for chest tightness, shortness of breath and wheezing.   Cardiovascular: Negative for chest pain, palpitations and leg swelling.  Gastrointestinal: Positive for nausea. Negative for vomiting.  Genitourinary: Positive for dysuria, frequency, hesitancy and urgency. Negative for flank pain and hematuria.    Patient Active Problem List   Diagnosis Date Noted  . 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 Clinical depression 10/22/2014  . AB (asthmatic bronchitis) 10/22/2014  . Addison anemia 10/22/2014  . Hot flash, menopausal 10/22/2014    Prior to Admission medications   Medication Sig Start Date End Date Taking? Authorizing Provider  ARIPiprazole (ABILIFY) 10 MG tablet Take 5 mg by mouth daily at 2 PM daily at 2 PM.   Yes Historical Provider, MD  diazepam (VALIUM) 5 MG tablet Take 1 tablet by mouth daily. AM   Yes Historical Provider, MD  estradiol (ESTRACE) 0.5 MG tablet TAKE 1 TABLET ONCE DAILY 10/16/15  Yes Reubin MilanLaura H Teshawn Moan, MD  simvastatin (ZOCOR) 40 MG tablet TAKE 1 TABLET BY MOUTH ONCE DAILY. 02/29/16  Yes Reubin MilanLaura H Tanaja Ganger, MD  venlafaxine XR (EFFEXOR-XR) 150 MG 24 hr capsule Take 150 mg by mouth daily with breakfast.   Yes Historical Provider, MD    No Known  Allergies  Past Surgical History:  Procedure Laterality Date  . ABDOMINAL HYSTERECTOMY    . APPENDECTOMY    . BREAST BIOPSY Left    x2-neg  . CATARACT EXTRACTION W/PHACO Left 05/25/2015   Procedure: CATARACT EXTRACTION PHACO AND INTRAOCULAR LENS PLACEMENT (IOC);  Surgeon: Sallee LangeSteven Dingeldein, MD;  Location: [facility] ORS;  Service: Ophthalmology;  Laterality: Left;  [place]: 01:25.9 AP%: 26.3 CDE: 37.92  Lot # [id number] H  . CATARACT EXTRACTION W/PHACO Right 07/20/2015   Procedure: CATARACT EXTRACTION PHACO AND INTRAOCULAR LENS PLACEMENT (IOC);  Surgeon: Sallee LangeSteven Dingeldein, MD;  Location: [facility] ORS;  Service: Ophthalmology;  Laterality: Right;  [place]      1:12.7 AP%    25.3 CDE    29.53 fluid casette lot # [id number] H  exp 09/15/2016    Social History  Substance Use Topics  . Smoking status: Former Games developermoker  . Smokeless tobacco: Never Used  . Alcohol use 0.0 oz/week     Comment: occ.     Medication list has been reviewed and updated.   Physical Exam  Constitutional: She appears well-developed and well-nourished.  Cardiovascular: Normal rate, regular rhythm and normal heart sounds.   Pulmonary/Chest: Effort normal and breath sounds normal. No respiratory distress.  Abdominal: Soft. Bowel sounds are normal. There is tenderness in the suprapubic area. There is no rebound, no guarding and no CVA tenderness.  Psychiatric: She has a normal mood and affect.  Nursing note and vitals reviewed.   BP 128/82   Pulse (!) 101   Temp 97.6 F (36.4 C)  Ht 5\' 1"  (1.549 m)   Wt 143 lb (64.9 kg)   SpO2 96%   BMI 27.02 kg/m   Assessment and Plan: 1. Acute cystitis with hematuria - POCT urinalysis dipstick - ciprofloxacin (CIPRO) 250 MG tablet; Take 1 tablet (250 mg total) by mouth 2 (two) times daily.  Dispense: 14 tablet; Refill: 0   Bari Edward, MD [facility]  05/10/2016

## 2016-05-13 ENCOUNTER — Telehealth: Payer: Self-pay | Admitting: Internal Medicine

## 2016-05-13 ENCOUNTER — Other Ambulatory Visit: Payer: Self-pay | Admitting: Internal Medicine

## 2016-05-13 MED ORDER — NITROFURANTOIN MONOHYD MACRO 100 MG PO CAPS: 100.0000 mg | ORAL_CAPSULE | Freq: Two times a day (BID) | ORAL | 0 refills | Status: DC

## 2016-05-13 NOTE — Telephone Encounter (Signed)
Called pt about medication sent to pharmacy and stop cipro

## 2016-05-13 NOTE — Telephone Encounter (Signed)
Pt called need stronger medication for cystitis

## 2016-05-13 NOTE — Telephone Encounter (Signed)
I sent new Rx to pharmacy.  Stop cipro.

## 2016-05-31 ENCOUNTER — Encounter: Payer: Self-pay | Admitting: Internal Medicine

## 2016-05-31 ENCOUNTER — Ambulatory Visit (INDEPENDENT_AMBULATORY_CARE_PROVIDER_SITE_OTHER): Payer: Medicare Other | Admitting: Internal Medicine

## 2016-05-31 VITALS — BP 122/78 | HR 88 | Temp 96.7°F | Ht 61.0 in | Wt 143.0 lb

## 2016-05-31 DIAGNOSIS — Z23 Encounter for immunization: Secondary | ICD-10-CM

## 2016-05-31 DIAGNOSIS — N3000 Acute cystitis without hematuria: Secondary | ICD-10-CM | POA: Diagnosis not present

## 2016-05-31 DIAGNOSIS — E784 Other hyperlipidemia: Secondary | ICD-10-CM | POA: Diagnosis not present

## 2016-05-31 DIAGNOSIS — E7849 Other hyperlipidemia: Secondary | ICD-10-CM

## 2016-05-31 DIAGNOSIS — Z0000 Encounter for general adult medical examination without abnormal findings: Secondary | ICD-10-CM | POA: Diagnosis not present

## 2016-05-31 DIAGNOSIS — F324 Major depressive disorder, single episode, in partial remission: Secondary | ICD-10-CM | POA: Diagnosis not present

## 2016-05-31 LAB — POCT URINALYSIS DIPSTICK
Bilirubin, UA: NEGATIVE
Glucose, UA: NEGATIVE
Ketones, UA: NEGATIVE
Leukocytes, UA: NEGATIVE
Nitrite, UA: NEGATIVE
Protein, UA: NEGATIVE
Spec Grav, UA: 1.01
Urobilinogen, UA: 0.2
pH, UA: 5

## 2016-05-31 MED ORDER — NITROFURANTOIN MONOHYD MACRO 100 MG PO CAPS: 100.0000 mg | ORAL_CAPSULE | Freq: Two times a day (BID) | ORAL | 0 refills | Status: DC

## 2016-05-31 NOTE — Patient Instructions (Addendum)
Pneumococcal Conjugate Vaccine (PCV13) What You Need to Know 1. Why get vaccinated? Vaccination can protect both children and adults from pneumococcal disease. Pneumococcal disease is caused by bacteria that can spread from person to person through close contact. It can cause ear infections, and it can also lead to more serious infections of the:  Lungs (pneumonia),  Blood (bacteremia), and  Covering of the brain and spinal cord (meningitis). Pneumococcal pneumonia is most common among adults. Pneumococcal meningitis can cause deafness and brain damage, and it kills about 1 child in 10 who get it. Anyone can get pneumococcal disease, but children under 59 years of age and adults 61 years and older, people with certain medical conditions, and cigarette smokers are at the highest risk. Before there was a vaccine, the [place] saw:  more than 700 cases of meningitis,  about 13,000 blood infections,  about 5 million ear infections, and  about 200 deaths in children under 5 each year from pneumococcal disease. Since vaccine became available, severe pneumococcal disease in these children has fallen by 88%. About 18,000 older adults die of pneumococcal disease each year in the [place]. Treatment of pneumococcal infections with penicillin and other drugs is not as effective as it used to be, because some strains of the disease have become resistant to these drugs. This makes prevention of the disease, through vaccination, even more important. 2. PCV13 vaccine Pneumococcal conjugate vaccine (called PCV13) protects against 13 types of pneumococcal bacteria. PCV13 is routinely given to children at 2, 4, 6, and 21-71 months of age. It is also recommended for children and adults 1 to 55 years of age with certain health conditions, and for all adults 70 years of age and older. Your doctor can give you details. 3. Some people should not get this vaccine Anyone who has ever had a  life-threatening allergic reaction to a dose of this vaccine, to an earlier pneumococcal vaccine called PCV7, or to any vaccine containing diphtheria toxoid (for example, DTaP), should not get PCV13. Anyone with a severe allergy to any component of PCV13 should not get the vaccine. Tell your doctor if the person being vaccinated has any severe allergies. If the person scheduled for vaccination is not feeling well, your healthcare provider might decide to reschedule the shot on another day. 4. Risks of a vaccine reaction With any medicine, including vaccines, there is a chance of reactions. These are usually mild and go away on their own, but serious reactions are also possible. Problems reported following PCV13 varied by age and dose in the series. The most common problems reported among children were:  About half became drowsy after the shot, had a temporary loss of appetite, or had redness or tenderness where the shot was given.  About 1 out of 3 had swelling where the shot was given.  About 1 out of 3 had a mild fever, and about 1 in 20 had a fever over 102.14F.  Up to about 8 out of 10 became fussy or irritable. Adults have reported pain, redness, and swelling where the shot was given; also mild fever, fatigue, headache, chills, or muscle pain. Young children who get PCV13 along with inactivated flu vaccine at the same time may be at increased risk for seizures caused by fever. Ask your doctor for more information. Problems that could happen after any vaccine:  People sometimes faint after a medical procedure, including vaccination. Sitting or lying down for about 15 minutes can help prevent fainting, and injuries  caused by a fall. Tell your doctor if you feel dizzy, or have vision changes or ringing in the ears.  Some older children and adults get severe pain in the shoulder and have difficulty moving the arm where a shot was given. This happens very rarely.  Any medication can cause a  severe allergic reaction. Such reactions from a vaccine are very rare, estimated at about 1 in a million doses, and would happen within a few minutes to a few hours after the vaccination. As with any medicine, there is a very small chance of a vaccine causing a serious injury or death. The safety of vaccines is always being monitored. For more information, visit: http://floyd.org/www.cdc.gov/vaccinesafety/ 5. What if there is a serious reaction? What should I look for? Look for anything that concerns you, such as signs of a severe allergic reaction, very high fever, or unusual behavior. Signs of a severe allergic reaction can include hives, swelling of the face and throat, difficulty breathing, a fast heartbeat, dizziness, and weakness-usually within a few minutes to a few hours after the vaccination. What should I do?  If you think it is a severe allergic reaction or other emergency that can't wait, call 9-1-1 or get the person to the nearest hospital. Otherwise, call your doctor.  Reactions should be reported to the Vaccine Adverse Event Reporting System (VAERS). Your doctor should file this report, or you can do it yourself through the VAERS web site at www.vaers.LAgents.nohhs.gov, or by calling 1-346 838 8725.  VAERS does not give medical advice. 6. The National Vaccine Injury Compensation Program The [employer] Vaccine Injury Compensation Program (VICP) is a federal program that was created to compensate people who may have been injured by certain vaccines. Persons who believe they may have been injured by a vaccine can learn about the program and about filing a claim by calling 1-978-523-5972 or visiting the VICP website at SpiritualWord.atwww.hrsa.gov/vaccinecompensation. There is a time limit to file a claim for compensation. 7. How can I learn more?  Ask your healthcare provider. He or she can give you the vaccine package insert or suggest other sources of information.  Call your local or state health department.  Contact the  Centers for Disease Control and Prevention (CDC):  Call 619 595 74081-(770) 096-3850 (1-800-CDC-INFO) or  Visit CDC's website at PicCapture.uywww.cdc.gov/vaccines Vaccine Information Statement, PCV13 Vaccine (02/20/2014) This information is not intended to replace advice given to you by your health care provider. Make sure you discuss any questions you have with your health care provider. Document Released: 01/30/2006 Document Revised: 12/24/2015 Document Reviewed: 12/24/2015 Elsevier Interactive Patient Education  2017 Elsevier Inc.   Health Maintenance  Topic Date Due  . ZOSTAVAX  07/15/1999  . DEXA SCAN  07/14/2004  . PNA vac Low Risk Adult (1 of 2 - PCV13) 04/18/2005  . TETANUS/TDAP  01/13/2021  . INFLUENZA VACCINE  Addressed

## 2016-05-31 NOTE — Progress Notes (Signed)
Patient: Jennifer Knapp, Female    DOB: March 05, 1941, 76 y.o.   MRN: [id number] Visit Date: 05/31/2016  Today's Provider: Bari Edward, MD   Chief Complaint  Patient presents with  . Annual Exam   Subjective:    Annual wellness visit Jennifer Knapp is a 76 y.o. female who presents today for her Subsequent Annual Wellness Visit. She feels fairly well. She reports exercising none. She reports she is sleeping well. No breast issues.  She had a recent mammogram. She fell at home last week - tripped on a rug and bruised her right face, forearm and left knee.  No fractures and was able to get up on her own.  Minimal facial pain but large bruise resolving. ----------------------------------------------------------- Hyperlipidemia  This is a chronic problem. The problem is controlled. Pertinent negatives include no chest pain or shortness of breath. Current antihyperlipidemic treatment includes statins.  Urinary Tract Infection   The current episode started 1 to 4 weeks ago. The problem has been gradually improving. The quality of the pain is described as burning. The pain is mild. There has been no fever. Pertinent negatives include no chills, frequency or vomiting. Treatments tried: completed 7 days macrobid.  Depression - followed by Psych on Effexor and Abilify.  She reports doing well on current therapy.  Review of Systems  Constitutional: Negative for chills, fatigue and fever.  HENT: Negative for congestion, hearing loss, tinnitus, trouble swallowing and voice change.   Eyes: Negative for visual disturbance.  Respiratory: Negative for cough, chest tightness, shortness of breath and wheezing.   Cardiovascular: Negative for chest pain, palpitations and leg swelling.  Gastrointestinal: Negative for abdominal pain, constipation, diarrhea and vomiting.  Endocrine: Negative for polydipsia and polyuria.  Genitourinary: Negative for dysuria, frequency, genital sores, vaginal bleeding  and vaginal discharge.  Musculoskeletal: Negative for arthralgias, gait problem and joint swelling.  Skin: Negative for color change and rash.  Neurological: Negative for dizziness, tremors, light-headedness and headaches.  Hematological: Negative for adenopathy. Does not bruise/bleed easily.  Psychiatric/Behavioral: Negative for dysphoric mood and sleep disturbance. The patient is not nervous/anxious.     Social History   Social History  . Marital status: Married    Spouse name: N/A  . Number of children: N/A  . Years of education: N/A   Occupational History  . Not on file.   Social History Main Topics  . Smoking status: Former Games developer  . Smokeless tobacco: Never Used  . Alcohol use 0.0 oz/week     Comment: occ.  . Drug use: No  . Sexual activity: Not on file   Other Topics Concern  . Not on file   Social History Narrative  . No narrative on file    Patient Active Problem List   Diagnosis Date Noted  . 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 Clinical depression 10/22/2014  . AB (asthmatic bronchitis) 10/22/2014  . Addison anemia 10/22/2014  . Hot flash, menopausal 10/22/2014    Past Surgical History:  Procedure Laterality Date  . ABDOMINAL HYSTERECTOMY    . APPENDECTOMY    . BREAST BIOPSY Left    x2-neg  . CATARACT EXTRACTION W/PHACO Left 05/25/2015   Procedure: CATARACT EXTRACTION PHACO AND INTRAOCULAR LENS PLACEMENT (IOC);  Surgeon: Sallee Lange, MD;  Location: [facility] ORS;  Service: Ophthalmology;  Laterality: Left;  [place]: 01:25.9 AP%: 26.3 CDE: 37.92  Lot # [id number]  . CATARACT EXTRACTION W/PHACO Right 07/20/2015   Procedure: CATARACT EXTRACTION PHACO AND INTRAOCULAR LENS PLACEMENT (IOC);  Surgeon: Sallee LangeSteven Dingeldein, MD;  Location: [facility] ORS;  Service: Ophthalmology;  Laterality: Right;  US      1:12.7 AP%    25.3 CDE    29.53 fluid casette lot # [id number]  exp 09/15/2016    Her family history includes Cancer in her mother;  Depression in her father.     Previous Medications   ARIPIPRAZOLE (ABILIFY) 10 MG TABLET    Take 5 mg by mouth daily at 2 PM daily at 2 PM.   CIPROFLOXACIN (CIPRO) 250 MG TABLET    Take 1 tablet (250 mg total) by mouth 2 (two) times daily.   DIAZEPAM (VALIUM) 5 MG TABLET    Take 1 tablet by mouth daily. AM   ESTRADIOL (ESTRACE) 0.5 MG TABLET    TAKE 1 TABLET ONCE DAILY   NITROFURANTOIN, MACROCRYSTAL-MONOHYDRATE, (MACROBID) 100 MG CAPSULE    Take 1 capsule (100 mg total) by mouth 2 (two) times daily.   SIMVASTATIN (ZOCOR) 40 MG TABLET    TAKE 1 TABLET BY MOUTH ONCE DAILY.   VENLAFAXINE (EFFEXOR) 75 MG TABLET    Take 75 mg by mouth 1 day or 1 dose.   VENLAFAXINE XR (EFFEXOR-XR) 150 MG 24 HR CAPSULE    Take 150 mg by mouth daily with breakfast.    Patient Care Team: Reubin MilanLaura H Filiberto Wamble, MD as PCP - General (Family Medicine)      Objective:   Vitals: BP 122/78   Pulse 88   Temp (!) 96.7 F (35.9 C)   Ht 5\' 1"  (1.549 m)   Wt 143 lb (64.9 kg)   SpO2 97%   BMI 27.02 kg/m   Physical Exam  Constitutional: She is oriented to person, place, and time. She appears well-developed and well-nourished. No distress.  HENT:  Head: Normocephalic and atraumatic.    Right Ear: Tympanic membrane and ear canal normal.  Left Ear: Tympanic membrane and ear canal normal.  Nose: Right sinus exhibits no maxillary sinus tenderness. Left sinus exhibits no maxillary sinus tenderness.  Mouth/Throat: Uvula is midline and oropharynx is clear and moist.  Left ear canal obstructed by cerumen  Eyes: Conjunctivae and EOM are normal. Right eye exhibits no discharge. Left eye exhibits no discharge. No scleral icterus.  Neck: Normal range of motion. Carotid bruit is not present. No erythema present. No thyromegaly present.  Cardiovascular: Normal rate, regular rhythm, normal heart sounds and normal pulses.   Pulmonary/Chest: Effort normal. No respiratory distress. She has no wheezes. Right breast exhibits no mass, no  nipple discharge, no skin change and no tenderness. Left breast exhibits no mass, no nipple discharge, no skin change and no tenderness.  Abdominal: Soft. Bowel sounds are normal. There is no hepatosplenomegaly. There is no tenderness. There is no CVA tenderness.  Musculoskeletal: Normal range of motion. She exhibits no edema or tenderness.  Lymphadenopathy:    She has no cervical adenopathy.    She has no axillary adenopathy.  Neurological: She is alert and oriented to person, place, and time. She has normal reflexes. No cranial nerve deficit or sensory deficit.  Skin: Skin is warm, dry and intact. Ecchymosis noted. No rash noted.  Psychiatric: She has a normal mood and affect. Her speech is normal and behavior is normal. Thought content normal.  Nursing note and vitals reviewed.   Activities of Daily Living In your present state of health, do you have any difficulty performing the following activities: 05/31/2016 11/26/2015  Hearing? Y N  Vision? N N  Difficulty concentrating  or making decisions? N N  Walking or climbing stairs? N N  Dressing or bathing? N N  Doing errands, shopping? N N  Preparing Food and eating ? N -  Using the Toilet? N -  In the past six months, have you accidently leaked urine? Y -  Do you have problems with loss of bowel control? N -  Managing your Medications? N -  Managing your Finances? N -  Housekeeping or managing your Housekeeping? N -  Some recent data might be hidden    Fall Risk Assessment Fall Risk  05/31/2016 06/10/2015 05/11/2015  Falls in the past year? Yes Yes Yes  Number falls in past yr: 2 or more - 1  Injury with Fall? Yes - Yes  Risk Factor Category  High Fall Risk - -  Risk for fall due to : History of fall(s) - -  Follow up - - Falls evaluation completed      Depression Screen PHQ 2/9 Scores 05/31/2016 06/10/2015  PHQ - 2 Score 2 0  PHQ- 9 Score 4 -    6CIT Screen 05/31/2016  What Year? 0 points  What month? 0 points  What time?  0 points  Count back from 20 0 points  Months in reverse 0 points  Repeat phrase 0 points  Total Score 0    Medicare Annual Wellness Visit Summary:  Reviewed patient's Family Medical History Reviewed and updated list of patient's medical providers Assessment of cognitive impairment was done Assessed patient's functional ability Established a written schedule for health screening services Health Risk Assessent Completed and Reviewed  Exercise Activities and Dietary recommendations Goals    None      Immunization History  Administered Date(s) Administered  . Pneumococcal-Unspecified 04/18/2004  . Tdap 01/14/2011    Health Maintenance  Topic Date Due  . ZOSTAVAX  07/15/1999  . DEXA SCAN  07/14/2004  . PNA vac Low Risk Adult (1 of 2 - PCV13) 04/18/2005  . TETANUS/TDAP  01/13/2021  . INFLUENZA VACCINE  Addressed    Discussed health benefits of physical activity, and encouraged her to engage in regular exercise appropriate for her age and condition.    ------------------------------------------------------------------------------------------------------------  Assessment & Plan:  1. Medicare annual wellness visit, subsequent Measures satisfied - CBC with Differential/Platelet - Comprehensive metabolic panel - POCT urinalysis dipstick  2. Familial multiple lipoprotein-type hyperlipidemia Continue statin - Lipid panel  3. Major depressive disorder with single episode, in partial remission (HCC) Doing well, followed by Psych - TSH  4. Need for pneumococcal vaccination - Pneumococcal conjugate vaccine 13-valent IM  5. Acute cystitis without hematuria Additional 5 days of antibiotics, - will need cx if persistent - nitrofurantoin, macrocrystal-monohydrate, (MACROBID) 100 MG capsule; Take 1 capsule (100 mg total) by mouth 2 (two) times daily.  Dispense: 10 capsule; Refill: 0   Bari Edward, MD [facility]  05/31/2016

## 2016-06-01 LAB — COMPREHENSIVE METABOLIC PANEL
ALT: 12 [IU]/L (ref 0–32)
AST: 18 [IU]/L (ref 0–40)
Albumin/Globulin Ratio: 1.6 (ref 1.2–2.2)
Albumin: 4.3 g/dL (ref 3.5–4.8)
Alkaline Phosphatase: 60 IU/L (ref 39–117)
BUN/Creatinine Ratio: 14 (ref 12–28)
BUN: 12 mg/dL (ref 8–27)
Bilirubin Total: 0.2 mg/dL (ref 0.0–1.2)
CO2: 25 mmol/L (ref 18–29)
Calcium: 9.7 mg/dL (ref 8.7–10.3)
Chloride: 98 mmol/L (ref 96–106)
Creatinine, Ser: 0.85 mg/dL (ref 0.57–1.00)
GFR calc Af Amer: 77 mL/min/{1.73_m2} (ref >59)
GFR calc non Af Amer: 67 mL/min/{1.73_m2} (ref >59)
Globulin, Total: 2.7 g/dL (ref 1.5–4.5)
Glucose: 94 mg/dL (ref 65–99)
Potassium: 4.9 mmol/L (ref 3.5–5.2)
Sodium: 139 mmol/L (ref 134–144)
Total Protein: 7 g/dL (ref 6.0–8.5)

## 2016-06-01 LAB — CBC WITH DIFFERENTIAL/PLATELET
Basophils Absolute: 0 10*3/uL (ref 0.0–0.2)
Basos: 0 %
EOS (ABSOLUTE): 0.2 10*3/uL (ref 0.0–0.4)
Eos: 2 %
Hematocrit: 40.9 % (ref 34.0–46.6)
Hemoglobin: 13.8 g/dL (ref 11.1–15.9)
Immature Grans (Abs): 0 10*3/uL (ref 0.0–0.1)
Immature Granulocytes: 0 %
Lymphocytes Absolute: 2.3 10*3/uL (ref 0.7–3.1)
Lymphs: 28 %
MCH: 30.5 pg (ref 26.6–33.0)
MCHC: 33.7 g/dL (ref 31.5–35.7)
MCV: 90 fL (ref 79–97)
Monocytes Absolute: 0.6 10*3/uL (ref 0.1–0.9)
Monocytes: 7 %
Neutrophils Absolute: 5.2 10*3/uL (ref 1.4–7.0)
Neutrophils: 63 %
Platelets: 390 10*3/uL — ABNORMAL HIGH (ref 150–379)
RBC: 4.53 x10E6/uL (ref 3.77–5.28)
RDW: 14 % (ref 12.3–15.4)
WBC: 8.2 10*3/uL (ref 3.4–10.8)

## 2016-06-01 LAB — LIPID PANEL
Chol/HDL Ratio: 3.1 ratio units (ref 0.0–4.4)
Cholesterol, Total: 212 mg/dL — ABNORMAL HIGH (ref 100–199)
HDL: 68 mg/dL (ref >39)
LDL Calculated: 99 mg/dL (ref 0–99)
Triglycerides: 223 mg/dL — ABNORMAL HIGH (ref 0–149)
VLDL Cholesterol Cal: 45 mg/dL — ABNORMAL HIGH (ref 5–40)

## 2016-06-01 LAB — TSH: TSH: 3.51 u[IU]/mL (ref 0.450–4.500)

## 2016-06-03 ENCOUNTER — Other Ambulatory Visit: Payer: Self-pay | Admitting: Internal Medicine

## 2016-06-03 ENCOUNTER — Telehealth: Payer: Self-pay

## 2016-06-03 MED ORDER — SULFAMETHOXAZOLE-TRIMETHOPRIM 800-160 MG PO TABS: 1.0000 | ORAL_TABLET | Freq: Two times a day (BID) | ORAL | 0 refills | Status: DC

## 2016-06-03 NOTE — Telephone Encounter (Signed)
I sent a new Rx.  If still having issues, will need to be checked again and possibly referred to Urology.

## 2016-06-03 NOTE — Telephone Encounter (Signed)
Pt called stating Macrobid 100mg  is not relieving her symptoms from her UTI. Pt is asking for "stronger antibiotic" or next step?

## 2016-06-06 NOTE — Telephone Encounter (Signed)
Called pt about new Rx. Pt symptoms has improved over the weekend, and no longer needs new RX.

## 2016-06-21 DIAGNOSIS — Z79899 Other long term (current) drug therapy: Secondary | ICD-10-CM | POA: Diagnosis not present

## 2016-06-21 DIAGNOSIS — F331 Major depressive disorder, recurrent, moderate: Secondary | ICD-10-CM | POA: Diagnosis not present

## 2016-06-30 ENCOUNTER — Other Ambulatory Visit: Payer: Self-pay

## 2016-06-30 ENCOUNTER — Other Ambulatory Visit (INDEPENDENT_AMBULATORY_CARE_PROVIDER_SITE_OTHER): Payer: Medicare Other

## 2016-06-30 ENCOUNTER — Telehealth: Payer: Self-pay

## 2016-06-30 DIAGNOSIS — N390 Urinary tract infection, site not specified: Secondary | ICD-10-CM

## 2016-06-30 DIAGNOSIS — N343 Urethral syndrome, unspecified: Secondary | ICD-10-CM

## 2016-06-30 LAB — POCT URINALYSIS DIPSTICK
Bilirubin, UA: NEGATIVE
Blood, UA: NEGATIVE
Glucose, UA: NEGATIVE
Ketones, UA: NEGATIVE
Leukocytes, UA: NEGATIVE
Nitrite, UA: NEGATIVE
Protein, UA: NEGATIVE
Spec Grav, UA: 1.015
Urobilinogen, UA: 0.2
pH, UA: 5

## 2016-06-30 NOTE — Telephone Encounter (Signed)
Called to inform pt Urinalysis came back normal so we sent in referral for Urology to see her. Told to call back if any other questions.

## 2016-07-14 ENCOUNTER — Ambulatory Visit: Payer: Medicare Other | Admitting: Urology

## 2016-08-06 NOTE — Progress Notes (Signed)
08/08/2016 3:03 PM   Jennifer Knapp Jennifer Knapp Jan 14, 1941 [id number]  Referring provider: Reubin Milan, MD [address] [place], [place] [address]  Chief Complaint  Patient presents with  . New Patient (Initial Visit)    urinary frequency referred by Dr. Aris Everts MD    HPI: Patient is 76 year old Caucasian female who is referred by Dr. Bari Edward for dysuria  Patient states that she has had urinary tract infections on and off her whole life.  She had a several years period of no symptoms.  She states that she had them when she was sexually active, but she is no longer having sed.  She recently had three rounds of antibiotics and the symptoms have not abated.    Her symptoms with a urinary tract infection consist of tingling, spasm ing when urinating and then after for a few seconds.  She denies dysuria, gross hematuria, suprapubic pain, back pain, abdominal pain or flank pain.  She has not had any recent fevers, chills, nausea or vomiting.     Her baseline symptoms consist of frequency, urgency, nocturia, incontinence and intermittency.  She does not have a history of nephrolithiasis, GU surgery or GU trauma.   Reviewing her records,  she has had not had any documented infections.  She is not sexually active.  She is post menopausal.      She denies constipation and/or diarrhea.  She does engage in good perineal hygiene. She does not take tub baths.   She does have urge incontinence.  She is using a small POISE pad daily.   She is not having pain with bladder filling.    She has not had any recent imaging studies.    She is drinking not drinking a lo of water daily.  Two cup of coffee and soda's through out the day.  Her UA today is unremarkable.   Her PVR today is 26 mL.   PMH: Past Medical History:  Diagnosis Date  . Anxiety   . Asthma   . Bronchitis    CHRONIC  . Depression   . Elevated cholesterol   . HLD (hyperlipidemia)     Surgical  History: Past Surgical History:  Procedure Laterality Date  . ABDOMINAL HYSTERECTOMY    . APPENDECTOMY    . BREAST BIOPSY Left    x2-neg  . CATARACT EXTRACTION W/PHACO Left 05/25/2015   Procedure: CATARACT EXTRACTION PHACO AND INTRAOCULAR LENS PLACEMENT (IOC);  Surgeon: Sallee Lange, MD;  Location: [facility];  Service: Ophthalmology;  Laterality: Left;  [place]: 01:25.9 AP%: 26.3 CDE: 37.92  Lot # [id number]  . CATARACT EXTRACTION W/PHACO Right 07/20/2015   Procedure: CATARACT EXTRACTION PHACO AND INTRAOCULAR LENS PLACEMENT (IOC);  Surgeon: Sallee Lange, MD;  Location: [facility];  Service: Ophthalmology;  Laterality: Right;  [place]      1:12.7 AP%    25.3 CDE    29.53 fluid casette lot # [id number]  exp 09/15/2016    Home Medications:  Allergies as of 08/08/2016   No Known Allergies     Medication List       Accurate as of 08/08/16  3:03 PM. Always use your most recent med list.          ARIPiprazole 10 MG tablet Commonly known as:  ABILIFY Take 5 mg by mouth daily at 2 PM daily at 2 PM.   desvenlafaxine 100 MG 24 hr tablet Commonly known as:  PRISTIQ Take 100 mg by mouth daily.   diazepam  5 MG tablet Commonly known as:  VALIUM Take 1 tablet by mouth daily. AM   estradiol 0.5 MG tablet Commonly known as:  ESTRACE TAKE 1 TABLET ONCE DAILY   nitrofurantoin (macrocrystal-monohydrate) 100 MG capsule Commonly known as:  MACROBID Take 1 capsule (100 mg total) by mouth 2 (two) times daily.   simvastatin 40 MG tablet Commonly known as:  ZOCOR TAKE 1 TABLET BY MOUTH ONCE DAILY.   sulfamethoxazole-trimethoprim 800-160 MG tablet Commonly known as:  BACTRIM DS,SEPTRA DS Take 1 tablet by mouth 2 (two) times daily.   venlafaxine XR 150 MG 24 hr capsule Commonly known as:  EFFEXOR-XR Take 150 mg by mouth daily with breakfast.   venlafaxine 75 MG tablet Commonly known as:  EFFEXOR Take 75 mg by mouth 1 day or 1 dose.       Allergies: No Known Allergies  Family  History: Family History  Problem Relation Age of Onset  . Cancer Mother     colon  . Hematuria Mother   . Urinary tract infection Mother   . Depression Father   . Breast cancer Neg Hx   . Kidney cancer Neg Hx   . Bladder Cancer Neg Hx     Social History:  reports that she has quit smoking. She has never used smokeless tobacco. She reports that she drinks alcohol. She reports that she does not use drugs.  ROS: UROLOGY Frequent Urination?: Yes Hard to postpone urination?: Yes Burning/pain with urination?: No Get up at night to urinate?: Yes Leakage of urine?: Yes Urine stream starts and stops?: Yes Trouble starting stream?: No Do you have to strain to urinate?: No Blood in urine?: No Urinary tract infection?: Yes Sexually transmitted disease?: No Injury to kidneys or bladder?: No Painful intercourse?: No Weak stream?: No Currently pregnant?: No Vaginal bleeding?: No Last menstrual period?: n  Gastrointestinal Nausea?: No Vomiting?: No Indigestion/heartburn?: No Diarrhea?: No Constipation?: No  Constitutional Fever: No Night sweats?: No Weight loss?: No Fatigue?: Yes  Skin Skin rash/lesions?: No Itching?: No  Eyes Blurred vision?: No Double vision?: No  Ears/Nose/Throat Sore throat?: No Sinus problems?: No  Hematologic/Lymphatic Swollen glands?: No Easy bruising?: No  Cardiovascular Leg swelling?: No Chest pain?: No  Respiratory Cough?: No Shortness of breath?: Yes  Endocrine Excessive thirst?: No  Musculoskeletal Back pain?: No Joint pain?: No  Neurological Headaches?: No Dizziness?: No  Psychologic Depression?: Yes Anxiety?: Yes  Physical Exam: BP (!) 171/88   Pulse 91   Ht  (1.549 m)   Wt 146 lb 14.4 oz (66.6 kg)   BMI 27.76 kg/m   Constitutional: Well nourished. Alert and oriented, No acute distress. HEENT: [place] AT, moist mucus membranes. Trachea midline, no masses. Cardiovascular: No clubbing, cyanosis, or  edema. Respiratory: Normal respiratory effort, no increased work of breathing. GI: Abdomen is soft, non tender, non distended, no abdominal masses. Liver and spleen not palpable.  No hernias appreciated.  Stool sample for occult testing is not indicated.   GU: No CVA tenderness.  No bladder fullness or masses.  Atrophic external genitalia, normal pubic hair distribution, no lesions.  Normal urethral meatus, no lesions, no prolapse, no discharge.   No urethral masses, tenderness and/or tenderness. No bladder fullness, tenderness or masses. Pale vagina mucosa, poor estrogen effect, no discharge, no lesions, good pelvic support, Grade I cystocele is noted.  Leaked on Valsalva.  No rectocele noted.  Cervix and uterus are surgically absent.  No adnexal/parametria masses or tenderness noted.  Anus and perineum  are without rashes or lesions.    Skin: No rashes, bruises or suspicious lesions. Lymph: No cervical or inguinal adenopathy. Neurologic: Grossly intact, no focal deficits, moving all 4 extremities. Psychiatric: Normal mood and affect.  Laboratory Data: Lab Results  Component Value Date   WBC 8.2 05/31/2016   HGB 13.6 04/20/2015   HCT 40.9 05/31/2016   MCV 90 05/31/2016   PLT 390 (H) 05/31/2016    Lab Results  Component Value Date   CREATININE 0.85 05/31/2016     Lab Results  Component Value Date   TSH 3.510 05/31/2016       Component Value Date/Time   CHOL 212 (H) 05/31/2016 0000   HDL 68 05/31/2016 0000   CHOLHDL 3.1 05/31/2016 0000   LDLCALC 99 05/31/2016 0000    Lab Results  Component Value Date   AST 18 05/31/2016   Lab Results  Component Value Date   ALT 12 05/31/2016     Urinalysis Unremarkable.  See EPIC.    Pertinent Imaging: Results for JOVANNA, HODGES (MRN [id number]) as of 08/08/2016 14:53  Ref. Range 08/08/2016 14:35  Scan Result Unknown 26    Assessment & Plan:    1. Urethral spasm  - may be due to vaginal atrophy  - Encouraged patient to  start drinking 8 cups of water daily and discontinue soda consumption  - will start vaginal estrogen cream and reassess in two weeks  - RTC in two weeks for symptom recheck and exam  2. Vaginal atrophy  - I explained to the patient that when women go through menopause and her estrogen levels are severely diminished, the normal vaginal flora will change.  This is due to an increase of the vaginal canal's pH. Because of this, the vaginal canal may be colonized by bacteria from the rectum instead of the protective lactobacillus.  This accompanied by the loss of the mucus barrier with vaginal atrophy is a cause of recurrent urinary tract infections.  - Explained to the patient that her menopausal state causes her vaginal tissue to become dry, inflamed and thin and this may be the cause of her tingling and spasming              - In some studies, the use of vaginal estrogen cream has been demonstrated to reduce  recurrent urinary tract infections to one a year.   - Patient was given a sample of vaginal estrogen cream (Premarin) and instructed to apply 0.5mg  (pea-sized amount)  just inside the vaginal introitus with a finger-tip every night for two weeks and then Monday, Wednesday and Friday nights.  I explained to the patient that vaginally administered estrogen, which causes only a slight increase in the blood estrogen levels, have fewer contraindications and adverse systemic effects that oral HT.  - explained that her oral estrogen may not be a high enough dose to stimulate her vaginal receptors  - She will follow up in two weeks for an exam.    Return in about 2 weeks (around 08/22/2016) for exam and symptom recheck.  These notes generated with voice recognition software. I apologize for typographical errors.  Michiel Cowboy, PA-C  [facility] [address] [place], [place] [address] [phone]

## 2016-08-08 ENCOUNTER — Encounter: Payer: Self-pay | Admitting: Urology

## 2016-08-08 ENCOUNTER — Ambulatory Visit (INDEPENDENT_AMBULATORY_CARE_PROVIDER_SITE_OTHER): Payer: Medicare Other | Admitting: Urology

## 2016-08-08 VITALS — BP 171/88 | HR 91 | Ht 61.0 in | Wt 146.9 lb

## 2016-08-08 DIAGNOSIS — N359 Urethral stricture, unspecified: Secondary | ICD-10-CM | POA: Diagnosis not present

## 2016-08-08 DIAGNOSIS — R350 Frequency of micturition: Secondary | ICD-10-CM | POA: Diagnosis not present

## 2016-08-08 DIAGNOSIS — N35919 Unspecified urethral stricture, male, unspecified site: Secondary | ICD-10-CM

## 2016-08-08 DIAGNOSIS — N952 Postmenopausal atrophic vaginitis: Secondary | ICD-10-CM

## 2016-08-08 LAB — URINALYSIS, COMPLETE
Bilirubin, UA: NEGATIVE
Glucose, UA: NEGATIVE
Ketones, UA: NEGATIVE
Leukocytes, UA: NEGATIVE
Nitrite, UA: NEGATIVE
Protein, UA: NEGATIVE
Specific Gravity, UA: <1.005 — ABNORMAL LOW (ref 1.005–1.030)
Urobilinogen, Ur: 0.2 mg/dL (ref 0.2–1.0)
pH, UA: 6 (ref 5.0–7.5)

## 2016-08-08 LAB — MICROSCOPIC EXAMINATION: RBC, UA: NONE SEEN /hpf (ref 0–?)

## 2016-08-08 LAB — BLADDER SCAN AMB NON-IMAGING: Scan Result: 26

## 2016-08-26 ENCOUNTER — Ambulatory Visit: Payer: Medicare Other | Admitting: Urology

## 2016-10-18 ENCOUNTER — Other Ambulatory Visit: Payer: Self-pay | Admitting: Internal Medicine

## 2016-12-20 DIAGNOSIS — F331 Major depressive disorder, recurrent, moderate: Secondary | ICD-10-CM | POA: Diagnosis not present

## 2016-12-23 ENCOUNTER — Encounter: Payer: Self-pay | Admitting: Internal Medicine

## 2016-12-23 ENCOUNTER — Ambulatory Visit (INDEPENDENT_AMBULATORY_CARE_PROVIDER_SITE_OTHER): Payer: Medicare Other | Admitting: Internal Medicine

## 2016-12-23 VITALS — BP 120/62 | HR 76 | Ht 61.0 in | Wt 139.6 lb

## 2016-12-23 DIAGNOSIS — Z23 Encounter for immunization: Secondary | ICD-10-CM

## 2016-12-23 DIAGNOSIS — F324 Major depressive disorder, single episode, in partial remission: Secondary | ICD-10-CM

## 2016-12-23 DIAGNOSIS — G5790 Unspecified mononeuropathy of unspecified lower limb: Secondary | ICD-10-CM | POA: Diagnosis not present

## 2016-12-23 DIAGNOSIS — M79671 Pain in right foot: Secondary | ICD-10-CM | POA: Diagnosis not present

## 2016-12-23 DIAGNOSIS — M79672 Pain in left foot: Secondary | ICD-10-CM | POA: Diagnosis not present

## 2016-12-23 NOTE — Progress Notes (Signed)
Date:  12/23/2016   Name:  Jennifer Knapp   DOB:  Jul 18, 1940   MRN:  [id number]   Chief Complaint: Foot Pain (3 weeks feet having been burning- on and off. Both feet. Feet only burn when sitting down. ) and Flu Vaccine (Already given in L arm. Pt tolerated meds well. )  Foot Injury   There was no injury mechanism. The pain is present in the left foot and right foot. The pain is mild. The pain has been fluctuating since onset. Pertinent negatives include no numbness. Associated symptoms comments: Feels hot - mostly when sitting with feet down.     Review of Systems  Constitutional: Negative for chills, fatigue and fever.  Respiratory: Negative for chest tightness and shortness of breath.   Cardiovascular: Negative for chest pain, palpitations and leg swelling.  Musculoskeletal: Negative for arthralgias, gait problem, joint swelling and myalgias.  Skin: Negative for color change and rash.  Neurological: Negative for dizziness, numbness and headaches.    Patient Active Problem List   Diagnosis Date Noted  . Major depressive disorder with single episode, in partial remission (HCC) 05/31/2016  . 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 Clinical depression 10/22/2014  . AB (asthmatic bronchitis) 10/22/2014  . Addison anemia 10/22/2014  . Hot flash, menopausal 10/22/2014    Prior to Admission medications   Medication Sig Start Date End Date Taking? Authorizing Provider  ARIPiprazole (ABILIFY) 10 MG tablet Take 5 mg by mouth daily at 2 PM daily at 2 PM.   Yes [provider]  desvenlafaxine (PRISTIQ) 100 MG 24 hr tablet Take 100 mg by mouth daily.   Yes [provider]  diazepam (VALIUM) 5 MG tablet Take 1 tablet by mouth daily. AM   Yes [provider]  estradiol (ESTRACE) 0.5 MG tablet TAKE 1 TABLET BY MOUTH ONCE DAILY. 10/18/16  Yes Reubin MilanBerglund, Jayzen Paver H, MD  simvastatin (ZOCOR) 40 MG tablet TAKE 1 TABLET BY MOUTH ONCE DAILY. 02/29/16   Yes Reubin MilanBerglund, Bralin Garry H, MD    No Known Allergies  Past Surgical History:  Procedure Laterality Date  . ABDOMINAL HYSTERECTOMY    . APPENDECTOMY    . BREAST BIOPSY Left    x2-neg  . CATARACT EXTRACTION W/PHACO Left 05/25/2015   Procedure: CATARACT EXTRACTION PHACO AND INTRAOCULAR LENS PLACEMENT (IOC);  Surgeon: Sallee LangeSteven Dingeldein, MD;  Location: [facility];  Service: Ophthalmology;  Laterality: Left;  [place]: 01:25.9 AP%: 26.3 CDE: 37.92  Lot # [id number]  . CATARACT EXTRACTION W/PHACO Right 07/20/2015   Procedure: CATARACT EXTRACTION PHACO AND INTRAOCULAR LENS PLACEMENT (IOC);  Surgeon: Sallee LangeSteven Dingeldein, MD;  Location: [facility];  Service: Ophthalmology;  Laterality: Right;  [place]      1:12.7 AP%    25.3 CDE    29.53 fluid casette lot # [id number] H  exp 09/15/2016    Social History  Substance Use Topics  . Smoking status: Former Games developermoker  . Smokeless tobacco: Never Used     Comment: quit early 90's  . Alcohol use 0.0 oz/week     Comment: occ.     Medication list has been reviewed and updated.  PHQ 2/9 Scores 12/23/2016 05/31/2016 06/10/2015  PHQ - 2 Score 0 2 0  PHQ- 9 Score 0 4 -    Physical Exam  Constitutional: She is oriented to person, place, and time. She appears well-developed. No distress.  HENT:  Head: Normocephalic and atraumatic.  Neck: Normal range of motion. Neck supple.  Cardiovascular: Normal rate, regular rhythm  and normal heart sounds.   Pulmonary/Chest: Effort normal and breath sounds normal. No respiratory distress.  Musculoskeletal: Normal range of motion. She exhibits no edema or tenderness.  Neurological: She is alert and oriented to person, place, and time. She has normal strength. No cranial nerve deficit or sensory deficit.  Skin: Skin is warm and dry. No rash noted.  Psychiatric: She has a normal mood and affect. Her behavior is normal. Thought content normal.  Nursing note and vitals reviewed.   BP 120/62   Pulse 76   Ht  (1.549 m)   Wt 139 lb 9.6 oz  (63.3 kg)   SpO2 97%   BMI 26.38 kg/m   Assessment and Plan: 1. Need for influenza vaccination - Flu Vaccine QUAD 6+ mos PF IM (Fluarix Quad PF)  2. Major depressive disorder with single episode, in partial remission (HCC) Stable,followed by Psych  3. Neuropathy of foot, unspecified laterality Recommend B complex vitamin with B12 daily   No orders of the defined types were placed in this encounter.   Partially dictated using Animal nutritionist. Any errors are unintentional.  Bari Edward, MD [facility]  12/23/2016

## 2016-12-23 NOTE — Patient Instructions (Signed)
Get a B Complex vitamin that contains also B12 (cyanocobalmin)

## 2017-03-03 ENCOUNTER — Other Ambulatory Visit: Payer: Self-pay | Admitting: Internal Medicine

## 2017-03-14 ENCOUNTER — Other Ambulatory Visit: Payer: Self-pay | Admitting: Internal Medicine

## 2017-05-15 ENCOUNTER — Other Ambulatory Visit: Payer: Self-pay | Admitting: Internal Medicine

## 2017-05-18 ENCOUNTER — Telehealth: Payer: Self-pay

## 2017-05-18 NOTE — Telephone Encounter (Signed)
ERROR

## 2017-05-20 DIAGNOSIS — H6691 Otitis media, unspecified, right ear: Secondary | ICD-10-CM | POA: Diagnosis not present

## 2017-05-20 DIAGNOSIS — J209 Acute bronchitis, unspecified: Secondary | ICD-10-CM | POA: Diagnosis not present

## 2017-05-20 DIAGNOSIS — J019 Acute sinusitis, unspecified: Secondary | ICD-10-CM | POA: Diagnosis not present

## 2017-05-30 ENCOUNTER — Encounter: Payer: Self-pay | Admitting: Internal Medicine

## 2017-05-30 ENCOUNTER — Ambulatory Visit: Payer: Medicare Other | Admitting: Internal Medicine

## 2017-05-30 VITALS — BP 136/70 | HR 97 | Ht 61.0 in | Wt 136.0 lb

## 2017-05-30 DIAGNOSIS — N3000 Acute cystitis without hematuria: Secondary | ICD-10-CM | POA: Diagnosis not present

## 2017-05-30 LAB — POC URINALYSIS WITH MICROSCOPIC (NON AUTO)MANUAL RESULT
Bilirubin, UA: NEGATIVE
Crystals: 0
Glucose, UA: NEGATIVE
Ketones, UA: NEGATIVE
Mucus, UA: 0
Nitrite, UA: POSITIVE
Protein, UA: NEGATIVE
RBC: 0 M/uL — AB (ref 4.04–5.48)
Spec Grav, UA: 1.015 (ref 1.010–1.025)
Urobilinogen, UA: 0.2 E.U./dL
pH, UA: 6 (ref 5.0–8.0)

## 2017-05-30 MED ORDER — CIPROFLOXACIN HCL 250 MG PO TABS: 250.0000 mg | ORAL_TABLET | Freq: Two times a day (BID) | ORAL | 0 refills | Status: AC

## 2017-05-30 NOTE — Progress Notes (Signed)
Date:  05/30/2017   Name:  Jennifer Knapp   DOB:  1941-03-05   MRN:  [id number]   Chief Complaint: Urinary Tract Infection (Started 3 days ago. burning and urgency but only can urinate slightly. Tried Azo - last took it lastnight. )  Urinary Tract Infection   This is a new problem. The current episode started in the past 7 days. The problem occurs every urination. The problem has been gradually worsening. The quality of the pain is described as burning. The pain is moderate. There has been no fever. Associated symptoms include frequency. Pertinent negatives include no chills, hematuria, nausea, urgency or vomiting.     Review of Systems  Constitutional: Negative for chills, fatigue and fever.  Respiratory: Negative for chest tightness.   Cardiovascular: Negative for chest pain.  Gastrointestinal: Negative for abdominal pain, diarrhea, nausea and vomiting.  Genitourinary: Positive for dysuria and frequency. Negative for hematuria, pelvic pain and urgency.    Patient Active Problem List   Diagnosis Date Noted  . 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 Major depressive disorder with single episode, in partial remission (HCC) 05/31/2016  . 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 AB (asthmatic bronchitis) 10/22/2014  . Addison anemia 10/22/2014  . Hot flash, menopausal 10/22/2014    Prior to Admission medications   Medication Sig Start Date End Date Taking? Authorizing Provider  ARIPiprazole (ABILIFY) 10 MG tablet Take 5 mg by mouth daily at 2 PM daily at 2 PM.   Yes [provider]  desvenlafaxine (PRISTIQ) 100 MG 24 hr tablet Take 100 mg by mouth daily.   Yes [provider]  diazepam (VALIUM) 5 MG tablet Take 1 tablet by mouth daily. AM   Yes [provider]  estradiol (ESTRACE) 0.5 MG tablet TAKE 1 TABLET BY MOUTH ONCE DAILY. 05/15/17  Yes Reubin Milan, MD  simvastatin (ZOCOR) 40 MG tablet TAKE 1 TABLET BY MOUTH ONCE DAILY.  03/03/17  Yes Reubin Milan, MD    No Known Allergies  Past Surgical History:  Procedure Laterality Date  . ABDOMINAL HYSTERECTOMY    . APPENDECTOMY    . BREAST BIOPSY Left    x2-neg  . CATARACT EXTRACTION W/PHACO Left 05/25/2015   Procedure: CATARACT EXTRACTION PHACO AND INTRAOCULAR LENS PLACEMENT (IOC);  Surgeon: Sallee Lange, MD;  Location: [facility];  Service: Ophthalmology;  Laterality: Left;  [place]: 01:25.9 AP%: 26.3 CDE: 37.92  Lot # [id number]  . CATARACT EXTRACTION W/PHACO Right 07/20/2015   Procedure: CATARACT EXTRACTION PHACO AND INTRAOCULAR LENS PLACEMENT (IOC);  Surgeon: Sallee Lange, MD;  Location: [facility];  Service: Ophthalmology;  Laterality: Right;  [place]      1:12.7 AP%    25.3 CDE    29.53 fluid casette lot # [id number] H  exp 09/15/2016    Social History   Tobacco Use  . Smoking status: Former Games developer  . Smokeless tobacco: Never Used  . Tobacco comment: quit early 90's  Substance Use Topics  . Alcohol use: Yes    Alcohol/week: 0.0 oz    Comment: occ.  . Drug use: No     Medication list has been reviewed and updated.  PHQ 2/9 Scores 05/30/2017 12/23/2016 05/31/2016 06/10/2015  PHQ - 2 Score 0 0 2 0  PHQ- 9 Score 1 0 4 -    Physical Exam  Constitutional: She appears well-developed and well-nourished.  Cardiovascular: Normal rate, regular rhythm and normal heart sounds.  Pulmonary/Chest: Effort normal and breath sounds normal. No respiratory  distress.  Abdominal: Soft. Bowel sounds are normal. There is tenderness in the suprapubic area. There is no rebound, no guarding and no CVA tenderness.  Psychiatric: She has a normal mood and affect.  Nursing note and vitals reviewed.   BP 136/70   Pulse 97   Ht 5\' 1"  (1.549 m)   Wt 136 lb (61.7 kg)   SpO2 98%   BMI 25.70 kg/m   Assessment and Plan: 1. Acute cystitis without hematuria Continue fluids, AZO if needed - POC urinalysis w microscopic (non auto) - ciprofloxacin (CIPRO) 250 MG tablet; Take 1  tablet (250 mg total) by mouth 2 (two) times daily for 7 days.  Dispense: 14 tablet; Refill: 0   Meds ordered this encounter  Medications  . ciprofloxacin (CIPRO) 250 MG tablet    Sig: Take 1 tablet (250 mg total) by mouth 2 (two) times daily for 7 days.    Dispense:  14 tablet    Refill:  0    Partially dictated using Animal nutritionistDragon software. Any errors are unintentional.  Bari EdwardLaura Caley Ciaramitaro, MD [facility]  05/30/2017

## 2017-06-14 ENCOUNTER — Ambulatory Visit (INDEPENDENT_AMBULATORY_CARE_PROVIDER_SITE_OTHER): Payer: Medicare Other | Admitting: Internal Medicine

## 2017-06-14 ENCOUNTER — Encounter: Payer: Self-pay | Admitting: Internal Medicine

## 2017-06-14 VITALS — BP 138/70 | HR 86 | Ht 61.0 in | Wt 142.0 lb

## 2017-06-14 DIAGNOSIS — F324 Major depressive disorder, single episode, in partial remission: Secondary | ICD-10-CM | POA: Diagnosis not present

## 2017-06-14 DIAGNOSIS — Z0001 Encounter for general adult medical examination with abnormal findings: Secondary | ICD-10-CM

## 2017-06-14 DIAGNOSIS — E782 Mixed hyperlipidemia: Secondary | ICD-10-CM

## 2017-06-14 DIAGNOSIS — N951 Menopausal and female climacteric states: Secondary | ICD-10-CM

## 2017-06-14 DIAGNOSIS — Z0000 Encounter for general adult medical examination without abnormal findings: Secondary | ICD-10-CM

## 2017-06-14 LAB — POCT URINALYSIS DIPSTICK
Bilirubin, UA: NEGATIVE
Blood, UA: NEGATIVE
Glucose, UA: NEGATIVE
Ketones, UA: NEGATIVE
Leukocytes, UA: NEGATIVE
Nitrite, UA: NEGATIVE
Protein, UA: NEGATIVE
Spec Grav, UA: <=1.005 — AB (ref 1.010–1.025)
Urobilinogen, UA: 0.2 E.U./dL
pH, UA: 6.5 (ref 5.0–8.0)

## 2017-06-14 NOTE — Patient Instructions (Addendum)
Calcium 1200 mg/day and Vitamin D 400-800 IU/day  (Calcium + D  600/400)  Health Maintenance  Topic Date Due  . PNA vac Low Risk Adult (2 of 2 - PPSV23) 05/31/2017  . DEXA SCAN  06/14/2020 (Originally 07/14/2004)  . TETANUS/TDAP  01/13/2021  . INFLUENZA VACCINE  Completed    Breast Self-Awareness Breast self-awareness means being familiar with how your breasts look and feel. It involves checking your breasts regularly and reporting any changes to your health care provider. Practicing breast self-awareness is important. A change in your breasts can be a sign of a serious medical problem. Being familiar with how your breasts look and feel allows you to find any problems early, when treatment is more likely to be successful. All women should practice breast self-awareness, including women who have had breast implants. How to do a breast self-exam One way to learn what is normal for your breasts and whether your breasts are changing is to do a breast self-exam. To do a breast self-exam: Look for Changes  1. Remove all the clothing above your waist. 2. Stand in front of a mirror in a room with good lighting. 3. Put your hands on your hips. 4. Push your hands firmly downward. 5. Compare your breasts in the mirror. Look for differences between them (asymmetry), such as: ? Differences in shape. ? Differences in size. ? Puckers, dips, and bumps in one breast and not the other. 6. Look at each breast for changes in your skin, such as: ? Redness. ? Scaly areas. 7. Look for changes in your nipples, such as: ? Discharge. ? Bleeding. ? Dimpling. ? Redness. ? A change in position. Feel for Changes  Carefully feel your breasts for lumps and changes. It is best to do this while lying on your back on the floor and again while sitting or standing in the shower or tub with soapy water on your skin. Feel each breast in the following way:  Place the arm on the side of the breast you are examining  above your head.  Feel your breast with the other hand.  Start in the nipple area and make  inch (2 cm) overlapping circles to feel your breast. Use the pads of your three middle fingers to do this. Apply light pressure, then medium pressure, then firm pressure. The light pressure will allow you to feel the tissue closest to the skin. The medium pressure will allow you to feel the tissue that is a little deeper. The firm pressure will allow you to feel the tissue close to the ribs.  Continue the overlapping circles, moving downward over the breast until you feel your ribs below your breast.  Move one finger-width toward the center of the body. Continue to use the  inch (2 cm) overlapping circles to feel your breast as you move slowly up toward your collarbone.  Continue the up and down exam using all three pressures until you reach your armpit.  Write Down What You Find  Write down what is normal for each breast and any changes that you find. Keep a written record with breast changes or normal findings for each breast. By writing this information down, you do not need to depend only on memory for size, tenderness, or location. Write down where you are in your menstrual cycle, if you are still menstruating. If you are having trouble noticing differences in your breasts, do not get discouraged. With time you will become more familiar with the variations in  your breasts and more comfortable with the exam. How often should I examine my breasts? Examine your breasts every month. If you are breastfeeding, the best time to examine your breasts is after a feeding or after using a breast pump. If you menstruate, the best time to examine your breasts is 5-7 days after your period is over. During your period, your breasts are lumpier, and it may be more difficult to notice changes. When should I see my health care provider? See your health care provider if you notice:  A change in shape or size of your  breasts or nipples.  A change in the skin of your breast or nipples, such as a reddened or scaly area.  Unusual discharge from your nipples.  A lump or thick area that was not there before.  Pain in your breasts.  Anything that concerns you.  This information is not intended to replace advice given to you by your health care provider. Make sure you discuss any questions you have with your health care provider. Document Released: 04/04/2005 Document Revised: 09/10/2015 Document Reviewed: 02/22/2015 Elsevier Interactive Patient Education  [employer].

## 2017-06-14 NOTE — Progress Notes (Signed)
Patient: Jennifer Knapp, Female    DOB: Jun 11, 1940, 77 y.o.   MRN: [id number] Visit Date: 06/14/2017  Today's Provider: Bari Edward, MD   Chief Complaint  Patient presents with  . Medicare Wellness    Patient has [employer] and needs MAW done by PCP. Needs Breast Exam.   Subjective:    Annual wellness visit Jennifer Knapp is a 77 y.o. female who presents today for her Subsequent Annual Wellness Visit. She feels fairly well. She reports exercising none. She reports she is sleeping fairly well.  She denies breast issues.  She is due for a mammogram but declines. She also declines DEXA. ---------------------------------------------------------- Hyperlipidemia  This is a chronic problem. Pertinent negatives include no chest pain or shortness of breath. Current antihyperlipidemic treatment includes statins.    Review of Systems  Constitutional: Negative for chills, fatigue and fever.  HENT: Negative for congestion, hearing loss, tinnitus, trouble swallowing and voice change.   Eyes: Negative for visual disturbance.  Respiratory: Negative for cough, chest tightness, shortness of breath and wheezing.   Cardiovascular: Negative for chest pain, palpitations and leg swelling.  Gastrointestinal: Negative for abdominal pain, constipation, diarrhea and vomiting.  Endocrine: Negative for polydipsia and polyuria.  Genitourinary: Negative for dysuria, frequency, genital sores, vaginal bleeding and vaginal discharge.  Musculoskeletal: Negative for arthralgias, gait problem and joint swelling.  Skin: Negative for color change and rash.  Neurological: Negative for dizziness, tremors, light-headedness and headaches.  Hematological: Negative for adenopathy. Does not bruise/bleed easily.  Psychiatric/Behavioral: Negative for dysphoric mood and sleep disturbance. The patient is not nervous/anxious.     Social History   Socioeconomic History  . Marital status: Married    Spouse name:  Not on file  . Number of children: Not on file  . Years of education: Not on file  . Highest education level: Not on file  Social Needs  . Financial resource strain: Not on file  . Food insecurity - worry: Not on file  . Food insecurity - inability: Not on file  . Transportation needs - medical: Not on file  . Transportation needs - non-medical: Not on file  Occupational History  . Not on file  Tobacco Use  . Smoking status: Former Games developer  . Smokeless tobacco: Never Used  . Tobacco comment: quit early 90's  Substance and Sexual Activity  . Alcohol use: Yes    Alcohol/week: 0.0 oz    Comment: occ.  . Drug use: No  . Sexual activity: Not Currently  Other Topics Concern  . Not on file  Social History Narrative  . Not on file    Patient Active Problem List   Diagnosis Date Noted  . 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 Major depressive disorder with single episode, in partial remission (HCC) 05/31/2016  . 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 AB (asthmatic bronchitis) 10/22/2014  . Addison anemia 10/22/2014  . Hot flash, menopausal 10/22/2014    Past Surgical History:  Procedure Laterality Date  . ABDOMINAL HYSTERECTOMY    . APPENDECTOMY    . BREAST BIOPSY Left    x2-neg  . CATARACT EXTRACTION W/PHACO Left 05/25/2015   Procedure: CATARACT EXTRACTION PHACO AND INTRAOCULAR LENS PLACEMENT (IOC);  Surgeon: Sallee Lange, MD;  Location: [facility] ORS;  Service: Ophthalmology;  Laterality: Left;  [place]: 01:25.9 AP%: 26.3 CDE: 37.92  Lot # [id number]  . CATARACT EXTRACTION W/PHACO Right 07/20/2015   Procedure: CATARACT EXTRACTION PHACO AND INTRAOCULAR LENS PLACEMENT (IOC);  Surgeon: Sallee Lange, MD;  Location: [facility] ORS;  Service: Ophthalmology;  Laterality: Right;  [place]      1:12.7 AP%    25.3 CDE    29.53 fluid casette lot # [id number]  exp 09/15/2016    Her family history includes Cancer in her mother; Depression in her father; Hematuria in her mother;  Urinary tract infection in her mother.     Current Meds  Medication Sig  . ARIPiprazole (ABILIFY) 10 MG tablet Take 5 mg by mouth daily at 2 PM daily at 2 PM.  . desvenlafaxine (PRISTIQ) 100 MG 24 hr tablet Take 100 mg by mouth daily.  . diazepam (VALIUM) 5 MG tablet Take 1 tablet by mouth daily. AM  . estradiol (ESTRACE) 0.5 MG tablet TAKE 1 TABLET BY MOUTH ONCE DAILY.  . simvastatin (ZOCOR) 40 MG tablet TAKE 1 TABLET BY MOUTH ONCE DAILY.    Patient Care Team: Reubin Milan, MD as PCP - General (Family Medicine) Jacquelin Hawking, MD as Referring Physician (Psychiatry)       Objective:   Vitals: BP 138/70   Pulse 86   Ht 5\' 1"  (1.549 m)   Wt 142 lb (64.4 kg)   SpO2 98%   BMI 26.83 kg/m   Physical Exam  Constitutional: She is oriented to person, place, and time. She appears well-developed and well-nourished. No distress.  HENT:  Head: Normocephalic and atraumatic.  Right Ear: Tympanic membrane and ear canal normal.  Left Ear: Tympanic membrane and ear canal normal.  Nose: Right sinus exhibits no maxillary sinus tenderness. Left sinus exhibits no maxillary sinus tenderness.  Mouth/Throat: Uvula is midline and oropharynx is clear and moist.  Eyes: Conjunctivae and EOM are normal. Right eye exhibits no discharge. Left eye exhibits no discharge. No scleral icterus.  Neck: Normal range of motion. Carotid bruit is not present. No erythema present. No thyromegaly present.  Cardiovascular: Normal rate, regular rhythm, normal heart sounds and normal pulses.  Pulmonary/Chest: Effort normal. No respiratory distress. She has no wheezes. Right breast exhibits no mass, no nipple discharge, no skin change and no tenderness. Left breast exhibits no mass, no nipple discharge, no skin change and no tenderness.  Abdominal: Soft. Bowel sounds are normal. There is no hepatosplenomegaly. There is no tenderness. There is no CVA tenderness.  Musculoskeletal: Normal range of motion. She exhibits no  edema or tenderness.       Right hip: Normal.       Left hip: Normal.       Right knee: Normal.       Left knee: Normal.  Lymphadenopathy:    She has no cervical adenopathy.    She has no axillary adenopathy.  Neurological: She is alert and oriented to person, place, and time. She has normal reflexes. No cranial nerve deficit or sensory deficit.  Skin: Skin is warm, dry and intact. No rash noted.  Psychiatric: She has a normal mood and affect. Her speech is normal and behavior is normal. Thought content normal.  Nursing note and vitals reviewed.   Activities of Daily Living In your present state of health, do you have any difficulty performing the following activities: 06/14/2017 06/14/2017  Hearing? N N  Vision? N N  Difficulty concentrating or making decisions? N N  Walking or climbing stairs? N N  Dressing or bathing? N N  Doing errands, shopping? N N  Preparing Food and eating ? N -  Using the Toilet? N -  In the past six months, have you accidently leaked urine?  N -  Do you have problems with loss of bowel control? N -  Managing your Medications? N -  Managing your Finances? N -  Housekeeping or managing your Housekeeping? N -  Some recent data might be hidden    Fall Risk Assessment Fall Risk  06/14/2017 05/31/2016 06/10/2015 05/11/2015  Falls in the past year? No Yes Yes Yes  Number falls in past yr: - 2 or more - 1  Injury with Fall? - Yes - Yes  Comment - face bruised on right side - -  Risk Factor Category  - High Fall Risk - -  Risk for fall due to : - History of fall(s) - -  Follow up - - - Falls evaluation completed     Depression Screen PHQ 2/9 Scores 06/14/2017 05/30/2017 12/23/2016 05/31/2016  PHQ - 2 Score 0 0 0 2  PHQ- 9 Score 0 1 0 4    6CIT Screen 06/14/2017 05/31/2016  What Year? 0 points 0 points  What month? 0 points 0 points  What time? 0 points 0 points  Count back from 20 0 points 0 points  Months in reverse 0 points 0 points  Repeat phrase 0 points  0 points  Total Score 0 0    Medicare Annual Wellness Visit Summary:  Reviewed patient's Family Medical History Reviewed and updated list of patient's medical providers Assessment of cognitive impairment was done Assessed patient's functional ability Established a written schedule for health screening services Health Risk Assessent Completed and Reviewed  Exercise Activities and Dietary recommendations Goals    None      Immunization History  Administered Date(s) Administered  . Influenza,inj,Quad PF,6+ Mos 12/23/2016  . Pneumococcal Conjugate-13 05/31/2016  . Pneumococcal-Unspecified 04/18/2004  . Tdap 01/14/2011    Health Maintenance  Topic Date Due  . DEXA SCAN  07/14/2004  . PNA vac Low Risk Adult (2 of 2 - PPSV23) 05/31/2017  . TETANUS/TDAP  01/13/2021  . INFLUENZA VACCINE  Completed    Discussed health benefits of physical activity, and encouraged her to engage in regular exercise appropriate for her age and condition.    ------------------------------------------------------------------------------------------------------------  Assessment & Plan:  1. Medicare annual wellness visit, subsequent Measures satisfied Pt declines Mammogram and DEXA Advised to take calcium + d daily - CBC with Differential/Platelet - POCT urinalysis dipstick  2. Hyperlipidemia, mixed On statin therapy - Comprehensive metabolic panel - Lipid panel  3. Hot flash, menopausal Controlled with estrogen  4. Major depressive disorder with single episode, in partial remission (HCC) Followed by Psych - TSH   No orders of the defined types were placed in this encounter.   Partially dictated using Animal nutritionistDragon software. Any errors are unintentional.  Bari EdwardLaura Isaak Delmundo, MD [facility]  06/14/2017

## 2017-06-15 LAB — COMPREHENSIVE METABOLIC PANEL
ALT: 9 IU/L (ref 0–32)
AST: 20 IU/L (ref 0–40)
Albumin/Globulin Ratio: 1.4 (ref 1.2–2.2)
Albumin: 4.2 g/dL (ref 3.5–4.8)
Alkaline Phosphatase: 58 IU/L (ref 39–117)
BUN/Creatinine Ratio: 13 (ref 12–28)
BUN: 13 mg/dL (ref 8–27)
Bilirubin Total: 0.4 mg/dL (ref 0.0–1.2)
CO2: 23 mmol/L (ref 20–29)
Calcium: 9.9 mg/dL (ref 8.7–10.3)
Chloride: 102 mmol/L (ref 96–106)
Creatinine, Ser: 1.04 mg/dL — ABNORMAL HIGH (ref 0.57–1.00)
GFR calc Af Amer: 60 mL/min/{1.73_m2} (ref >59)
GFR calc non Af Amer: 52 mL/min/{1.73_m2} — ABNORMAL LOW (ref >59)
Globulin, Total: 2.9 g/dL (ref 1.5–4.5)
Glucose: 96 mg/dL (ref 65–99)
Potassium: 4.3 mmol/L (ref 3.5–5.2)
Sodium: 142 mmol/L (ref 134–144)
Total Protein: 7.1 g/dL (ref 6.0–8.5)

## 2017-06-15 LAB — CBC WITH DIFFERENTIAL/PLATELET
Basophils Absolute: 0 10*3/uL (ref 0.0–0.2)
Basos: 0 %
EOS (ABSOLUTE): 0.1 10*3/uL (ref 0.0–0.4)
Eos: 1 %
Hematocrit: 41.5 % (ref 34.0–46.6)
Hemoglobin: 13.7 g/dL (ref 11.1–15.9)
Immature Grans (Abs): 0 10*3/uL (ref 0.0–0.1)
Immature Granulocytes: 0 %
Lymphocytes Absolute: 2.7 10*3/uL (ref 0.7–3.1)
Lymphs: 39 %
MCH: 30.6 pg (ref 26.6–33.0)
MCHC: 33 g/dL (ref 31.5–35.7)
MCV: 93 fL (ref 79–97)
Monocytes Absolute: 0.4 10*3/uL (ref 0.1–0.9)
Monocytes: 6 %
Neutrophils Absolute: 3.6 10*3/uL (ref 1.4–7.0)
Neutrophils: 54 %
Platelets: 351 10*3/uL (ref 150–379)
RBC: 4.47 x10E6/uL (ref 3.77–5.28)
RDW: 13.9 % (ref 12.3–15.4)
WBC: 6.8 10*3/uL (ref 3.4–10.8)

## 2017-06-15 LAB — LIPID PANEL
Chol/HDL Ratio: 3.2 {ratio} (ref 0.0–4.4)
Cholesterol, Total: 185 mg/dL (ref 100–199)
HDL: 57 mg/dL (ref >39)
LDL Calculated: 98 mg/dL (ref 0–99)
Triglycerides: 150 mg/dL — ABNORMAL HIGH (ref 0–149)
VLDL Cholesterol Cal: 30 mg/dL (ref 5–40)

## 2017-06-15 LAB — TSH: TSH: 2.87 u[IU]/mL (ref 0.450–4.500)

## 2017-06-20 DIAGNOSIS — Z79899 Other long term (current) drug therapy: Secondary | ICD-10-CM | POA: Diagnosis not present

## 2017-06-20 DIAGNOSIS — F331 Major depressive disorder, recurrent, moderate: Secondary | ICD-10-CM | POA: Diagnosis not present

## 2017-08-07 ENCOUNTER — Other Ambulatory Visit: Payer: Self-pay | Admitting: Internal Medicine

## 2017-09-14 ENCOUNTER — Other Ambulatory Visit: Payer: Self-pay | Admitting: Internal Medicine

## 2017-12-19 DIAGNOSIS — F331 Major depressive disorder, recurrent, moderate: Secondary | ICD-10-CM | POA: Diagnosis not present

## 2018-02-26 DIAGNOSIS — M2042 Other hammer toe(s) (acquired), left foot: Secondary | ICD-10-CM | POA: Diagnosis not present

## 2018-02-26 DIAGNOSIS — M79675 Pain in left toe(s): Secondary | ICD-10-CM | POA: Diagnosis not present

## 2018-03-16 ENCOUNTER — Other Ambulatory Visit: Payer: Self-pay | Admitting: Internal Medicine

## 2018-05-08 ENCOUNTER — Ambulatory Visit: Payer: Medicare Other | Admitting: Internal Medicine

## 2018-05-14 ENCOUNTER — Other Ambulatory Visit: Payer: Self-pay | Admitting: Internal Medicine

## 2018-05-18 ENCOUNTER — Other Ambulatory Visit: Payer: Self-pay

## 2018-05-18 ENCOUNTER — Ambulatory Visit (INDEPENDENT_AMBULATORY_CARE_PROVIDER_SITE_OTHER): Payer: Medicare Other | Admitting: Internal Medicine

## 2018-05-18 ENCOUNTER — Encounter: Payer: Self-pay | Admitting: Internal Medicine

## 2018-05-18 VITALS — BP 136/80 | HR 73 | Temp 97.6°F | Resp 14 | Ht 61.0 in | Wt 140.0 lb

## 2018-05-18 DIAGNOSIS — M21612 Bunion of left foot: Secondary | ICD-10-CM

## 2018-05-18 DIAGNOSIS — S76312A Strain of muscle, fascia and tendon of the posterior muscle group at thigh level, left thigh, initial encounter: Secondary | ICD-10-CM

## 2018-05-18 DIAGNOSIS — M2042 Other hammer toe(s) (acquired), left foot: Secondary | ICD-10-CM | POA: Diagnosis not present

## 2018-05-18 NOTE — Progress Notes (Signed)
Date:  05/18/2018   Name:  Jennifer KerbsBobbie [name]   DOB:  02-22-1941   MRN:  [id number]   Chief Complaint: Surgery Clearance (L) toe surgery.) and Leg Pain (Started last Weds. Left leg pain. Stepped out of the ambulance and was along way between the truck and the ground. And having pain since then. Doesn't hurt unless putting weight on it. )  Toe Pain   There was no injury mechanism. The pain is moderate. She has tried NSAIDs for the symptoms. The treatment provided no relief (planning toe surgery and needs clearance).  Hip Pain   The incident occurred more than 1 week ago. The injury mechanism was a compression (took a high step off an ambulance and pain started). The pain is present in the right hip. She has tried NSAIDs for the symptoms. The treatment provided mild relief.    Review of Systems  Constitutional: Negative for chills, fatigue and fever.  HENT: Negative for trouble swallowing.   Respiratory: Negative for chest tightness, shortness of breath and wheezing.   Cardiovascular: Negative for chest pain, palpitations and leg swelling.  Gastrointestinal: Negative for abdominal pain and diarrhea.  Musculoskeletal: Positive for arthralgias, gait problem and myalgias.  Neurological: Negative for dizziness and headaches.  Psychiatric/Behavioral: Negative for dysphoric mood and sleep disturbance.    Patient Active Problem List   Diagnosis Date Noted  . 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 Major depressive disorder with single episode, in partial remission (HCC) 05/31/2016  . Hyperlipidemia, mixed 10/22/2014  . AB (asthmatic bronchitis) 10/22/2014  . Addison anemia 10/22/2014  . Hot flash, menopausal 10/22/2014    No Known Allergies  Past Surgical History:  Procedure Laterality Date  . ABDOMINAL HYSTERECTOMY    . APPENDECTOMY    . BREAST BIOPSY Left    x2-neg  . CATARACT EXTRACTION W/PHACO Left 05/25/2015   Procedure: CATARACT EXTRACTION PHACO AND INTRAOCULAR LENS PLACEMENT (IOC);   Surgeon: Sallee LangeSteven Dingeldein, MD;  Location: [facility] ORS;  Service: Ophthalmology;  Laterality: Left;  [place]: 01:25.9 AP%: 26.3 CDE: 37.92  Lot # [id number]  . CATARACT EXTRACTION W/PHACO Right 07/20/2015   Procedure: CATARACT EXTRACTION PHACO AND INTRAOCULAR LENS PLACEMENT (IOC);  Surgeon: Sallee LangeSteven Dingeldein, MD;  Location: [facility] ORS;  Service: Ophthalmology;  Laterality: Right;  [place]      1:12.7 AP%    25.3 CDE    29.53 fluid casette lot # [id number] H  exp 09/15/2016    Social History   Tobacco Use  . Smoking status: Former Games developermoker  . Smokeless tobacco: Never Used  . Tobacco comment: quit early 90's  Substance Use Topics  . Alcohol use: Yes    Alcohol/week: 0.0 standard drinks    Comment: occ.  . Drug use: No     Medication list has been reviewed and updated.  Current Meds  Medication Sig  . ARIPiprazole (ABILIFY) 10 MG tablet Take 5 mg by mouth daily at 2 PM daily at 2 PM.  . desvenlafaxine (PRISTIQ) 100 MG 24 hr tablet Take 100 mg by mouth daily.  . diazepam (VALIUM) 5 MG tablet Take 1 tablet by mouth daily. AM  . estradiol (ESTRACE) 0.5 MG tablet TAKE 1 TABLET BY MOUTH ONCE DAILY.  . simvastatin (ZOCOR) 40 MG tablet TAKE 1 TABLET BY MOUTH ONCE DAILY.    PHQ 2/9 Scores 05/18/2018 06/14/2017 05/30/2017 12/23/2016  PHQ - 2 Score 0 0 0 0  PHQ- 9 Score - 0 1 0    Physical Exam Vitals signs and nursing  note reviewed.  Constitutional:      General: She is not in acute distress.    Appearance: She is well-developed.  HENT:     Head: Normocephalic and atraumatic.  Eyes:     Pupils: Pupils are equal, round, and reactive to light.  Neck:     Musculoskeletal: Normal range of motion and neck supple.     Vascular: No carotid bruit.  Cardiovascular:     Rate and Rhythm: Normal rate and regular rhythm.     Pulses: Normal pulses.  Pulmonary:     Effort: Pulmonary effort is normal. No respiratory distress.     Breath sounds: Normal breath sounds.  Abdominal:     General: Abdomen is flat.      Palpations: Abdomen is soft.     Tenderness: There is no abdominal tenderness.  Musculoskeletal:     Left upper leg: She exhibits tenderness.     Comments: Severe left bunion and second toe hammertoe deformity  Lymphadenopathy:     Cervical: No cervical adenopathy.  Skin:    General: Skin is warm and dry.     Findings: No rash.  Neurological:     Mental Status: She is alert and oriented to person, place, and time.  Psychiatric:        Attention and Perception: Attention normal.        Mood and Affect: Mood normal.        Behavior: Behavior normal.        Thought Content: Thought content normal.     BP 136/80   Pulse 73   Temp 97.6 F (36.4 C)   Resp 14   Ht 5\' 1"  (1.549 m)   Wt 140 lb (63.5 kg)   SpO2 97%   BMI 26.45 kg/m   Assessment and Plan: 1. Hammertoe of left foot Surgery planned Form completed  2. Strain of left hamstring, initial encounter Conservative therapy, rest, advil or tylenol as needed  3. Bunion of left foot chronic   Partially dictated using Animal nutritionistDragon software. Any errors are unintentional.  Bari EdwardLaura Aldwin Micalizzi, MD [facility]  05/18/2018

## 2018-06-01 ENCOUNTER — Ambulatory Visit
Admission: RE | Admit: 2018-06-01 | Discharge: 2018-06-01 | Disposition: A | Discharge status: Home / Self Care | Payer: Medicare Other | Attending: Internal Medicine | Admitting: Internal Medicine

## 2018-06-01 ENCOUNTER — Ambulatory Visit (INDEPENDENT_AMBULATORY_CARE_PROVIDER_SITE_OTHER): Payer: Medicare Other | Admitting: Internal Medicine

## 2018-06-01 ENCOUNTER — Other Ambulatory Visit: Payer: Self-pay

## 2018-06-01 ENCOUNTER — Encounter: Payer: Self-pay | Admitting: Internal Medicine

## 2018-06-01 ENCOUNTER — Ambulatory Visit
Admission: RE | Admit: 2018-06-01 | Discharge: 2018-06-01 | Disposition: A | Discharge status: Home / Self Care | Payer: Medicare Other | Source: Ambulatory Visit | Attending: Internal Medicine | Admitting: Internal Medicine

## 2018-06-01 VITALS — BP 146/78 | HR 96 | Ht 61.0 in | Wt 139.0 lb

## 2018-06-01 DIAGNOSIS — S79912A Unspecified injury of left hip, initial encounter: Secondary | ICD-10-CM | POA: Diagnosis not present

## 2018-06-01 DIAGNOSIS — M25552 Pain in left hip: Secondary | ICD-10-CM

## 2018-06-01 MED ORDER — PREDNISONE 10 MG PO TABS: 10.0000 mg | ORAL_TABLET | ORAL | 0 refills | Status: AC

## 2018-06-01 NOTE — Progress Notes (Signed)
Date:  06/01/2018   Name:  Jennifer Knapp   DOB:  12/06/1940   MRN:  [id number]   Chief Complaint: Hip Pain (Over a week ago. ) Seen 2 weeks ago with muscle strain from stepping down from a high step on an ambulance.  That pain has resolved but now has hip pain.  No new injury. Hip Pain   Injury mechanism: did not start after she stepped down from the ambulance 2 weeks ago. The pain is present in the left hip. The quality of the pain is described as aching. The pain is moderate. Associated symptoms include an inability to bear weight. Pertinent negatives include no muscle weakness, numbness or tingling. The symptoms are aggravated by weight bearing and movement. She has tried NSAIDs for the symptoms. The treatment provided mild relief.    Review of Systems  Constitutional: Negative for chills, fatigue and fever.  Respiratory: Negative for cough and shortness of breath.   Cardiovascular: Negative for chest pain, palpitations and leg swelling.  Musculoskeletal: Positive for arthralgias and gait problem.  Neurological: Negative for tingling and numbness.    Patient Active Problem List   Diagnosis Date Noted  . 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 Major depressive disorder with single episode, in partial remission (HCC) 05/31/2016  . Hyperlipidemia, mixed 10/22/2014  . AB (asthmatic bronchitis) 10/22/2014  . Addison anemia 10/22/2014  . Hot flash, menopausal 10/22/2014    No Known Allergies  Past Surgical History:  Procedure Laterality Date  . ABDOMINAL HYSTERECTOMY    . APPENDECTOMY    . BREAST BIOPSY Left    x2-neg  . CATARACT EXTRACTION W/PHACO Left 05/25/2015   Procedure: CATARACT EXTRACTION PHACO AND INTRAOCULAR LENS PLACEMENT (IOC);  Surgeon: Sallee Lange, MD;  Location: [facility] ORS;  Service: Ophthalmology;  Laterality: Left;  [place]: 01:25.9 AP%: 26.3 CDE: 37.92  Lot # [id number]  . CATARACT EXTRACTION W/PHACO Right 07/20/2015   Procedure: CATARACT EXTRACTION PHACO AND  INTRAOCULAR LENS PLACEMENT (IOC);  Surgeon: Sallee Lange, MD;  Location: [facility] ORS;  Service: Ophthalmology;  Laterality: Right;  [place]      1:12.7 AP%    25.3 CDE    29.53 fluid casette lot # [id number]  exp 09/15/2016    Social History   Tobacco Use  . Smoking status: Former Games developer  . Smokeless tobacco: Never Used  . Tobacco comment: quit early 90's  Substance Use Topics  . Alcohol use: Yes    Alcohol/week: 0.0 standard drinks    Comment: occ.  . Drug use: No     Medication list has been reviewed and updated.  Current Meds  Medication Sig  . ARIPiprazole (ABILIFY) 10 MG tablet Take 5 mg by mouth daily at 2 PM daily at 2 PM.  . desvenlafaxine (PRISTIQ) 100 MG 24 hr tablet Take 100 mg by mouth daily.  . diazepam (VALIUM) 5 MG tablet Take 1 tablet by mouth daily. AM  . estradiol (ESTRACE) 0.5 MG tablet TAKE 1 TABLET BY MOUTH ONCE DAILY.  . simvastatin (ZOCOR) 40 MG tablet TAKE 1 TABLET BY MOUTH ONCE DAILY.    PHQ 2/9 Scores 06/01/2018 05/18/2018 06/14/2017 05/30/2017  PHQ - 2 Score 0 0 0 0  PHQ- 9 Score - - 0 1    Physical Exam Vitals signs and nursing note reviewed.  Constitutional:      General: She is not in acute distress.    Appearance: Normal appearance. She is well-developed.  HENT:     Head: Normocephalic and  atraumatic.  Pulmonary:     Effort: Pulmonary effort is normal. No respiratory distress.  Musculoskeletal:     Right hip: Normal.     Left hip: She exhibits decreased range of motion. She exhibits no tenderness, no bony tenderness and no crepitus.  Skin:    General: Skin is warm and dry.     Findings: No rash.  Neurological:     Mental Status: She is alert and oriented to person, place, and time.     Gait: Gait abnormal (limping on weightbearing).     Deep Tendon Reflexes: Reflexes are normal and symmetric.  Psychiatric:        Behavior: Behavior normal.        Thought Content: Thought content normal.     BP (!) 146/78   Pulse 96   Ht 5\' 1"  (1.549  m)   Wt 139 lb (63 kg)   SpO2 95%   BMI 26.26 kg/m   Assessment and Plan: 1. Pain of left hip joint Will advise when xrays return - predniSONE (DELTASONE) 10 MG tablet; Take 1 tablet (10 mg total) by mouth as directed for 6 days. Take 6,5,4,3,2,1 then stop  Dispense: 21 tablet; Refill: 0 - DG HIP UNILAT WITH PELVIS 2-3 VIEWS LEFT; Future   Partially dictated using Animal nutritionist. Any errors are unintentional.  Bari Edward, MD [facility]  06/01/2018

## 2018-06-01 NOTE — Patient Instructions (Signed)
Do not take Aleve while taking Prednisone.

## 2018-06-05 DIAGNOSIS — M4316 Spondylolisthesis, lumbar region: Secondary | ICD-10-CM | POA: Diagnosis not present

## 2018-06-11 DIAGNOSIS — M4316 Spondylolisthesis, lumbar region: Secondary | ICD-10-CM | POA: Diagnosis not present

## 2018-06-14 DIAGNOSIS — M4316 Spondylolisthesis, lumbar region: Secondary | ICD-10-CM | POA: Diagnosis not present

## 2018-06-18 DIAGNOSIS — M2042 Other hammer toe(s) (acquired), left foot: Secondary | ICD-10-CM | POA: Diagnosis not present

## 2018-06-18 DIAGNOSIS — M79675 Pain in left toe(s): Secondary | ICD-10-CM | POA: Diagnosis not present

## 2018-06-19 DIAGNOSIS — Z79899 Other long term (current) drug therapy: Secondary | ICD-10-CM | POA: Diagnosis not present

## 2018-06-19 DIAGNOSIS — F331 Major depressive disorder, recurrent, moderate: Secondary | ICD-10-CM | POA: Diagnosis not present

## 2018-06-22 DIAGNOSIS — M21612 Bunion of left foot: Secondary | ICD-10-CM | POA: Diagnosis not present

## 2018-06-22 DIAGNOSIS — E785 Hyperlipidemia, unspecified: Secondary | ICD-10-CM | POA: Diagnosis not present

## 2018-06-22 DIAGNOSIS — Z8709 Personal history of other diseases of the respiratory system: Secondary | ICD-10-CM | POA: Diagnosis not present

## 2018-06-22 DIAGNOSIS — M24478 Recurrent dislocation, left toe(s): Secondary | ICD-10-CM | POA: Diagnosis not present

## 2018-06-22 DIAGNOSIS — F329 Major depressive disorder, single episode, unspecified: Secondary | ICD-10-CM | POA: Diagnosis not present

## 2018-06-22 DIAGNOSIS — M79675 Pain in left toe(s): Secondary | ICD-10-CM | POA: Diagnosis not present

## 2018-06-22 DIAGNOSIS — Z79899 Other long term (current) drug therapy: Secondary | ICD-10-CM | POA: Diagnosis not present

## 2018-06-22 DIAGNOSIS — F419 Anxiety disorder, unspecified: Secondary | ICD-10-CM | POA: Diagnosis not present

## 2018-06-22 DIAGNOSIS — Z90710 Acquired absence of both cervix and uterus: Secondary | ICD-10-CM | POA: Diagnosis not present

## 2018-06-22 DIAGNOSIS — M2042 Other hammer toe(s) (acquired), left foot: Secondary | ICD-10-CM | POA: Diagnosis not present

## 2018-06-22 DIAGNOSIS — Z79890 Hormone replacement therapy (postmenopausal): Secondary | ICD-10-CM | POA: Diagnosis not present

## 2018-06-26 DIAGNOSIS — Z89422 Acquired absence of other left toe(s): Secondary | ICD-10-CM | POA: Diagnosis not present

## 2018-08-06 ENCOUNTER — Other Ambulatory Visit: Payer: Self-pay | Admitting: Internal Medicine

## 2018-08-06 ENCOUNTER — Telehealth: Payer: Self-pay

## 2018-08-06 MED ORDER — IBUPROFEN 800 MG PO TABS: 800.0000 mg | ORAL_TABLET | Freq: Two times a day (BID) | ORAL | 1 refills | Status: DC | PRN

## 2018-08-06 NOTE — Telephone Encounter (Signed)
Was given 800 IBP from surgeon after toe procedure and wants [place] to fill this since no longer seeing that surgeon. Call back if not allowed otherwise send to pharmacy in chart.

## 2018-08-23 ENCOUNTER — Other Ambulatory Visit: Payer: Self-pay | Admitting: Internal Medicine

## 2018-09-18 ENCOUNTER — Ambulatory Visit (INDEPENDENT_AMBULATORY_CARE_PROVIDER_SITE_OTHER): Payer: Medicare Other | Admitting: Internal Medicine

## 2018-09-18 ENCOUNTER — Other Ambulatory Visit: Payer: Self-pay

## 2018-09-18 ENCOUNTER — Encounter: Payer: Self-pay | Admitting: Internal Medicine

## 2018-09-18 VITALS — BP 124/70 | HR 99 | Ht 61.0 in | Wt 137.0 lb

## 2018-09-18 DIAGNOSIS — N951 Menopausal and female climacteric states: Secondary | ICD-10-CM

## 2018-09-18 DIAGNOSIS — Z1231 Encounter for screening mammogram for malignant neoplasm of breast: Secondary | ICD-10-CM | POA: Diagnosis not present

## 2018-09-18 DIAGNOSIS — F324 Major depressive disorder, single episode, in partial remission: Secondary | ICD-10-CM | POA: Diagnosis not present

## 2018-09-18 DIAGNOSIS — E538 Deficiency of other specified B group vitamins: Secondary | ICD-10-CM

## 2018-09-18 DIAGNOSIS — Z0000 Encounter for general adult medical examination without abnormal findings: Secondary | ICD-10-CM | POA: Diagnosis not present

## 2018-09-18 DIAGNOSIS — E782 Mixed hyperlipidemia: Secondary | ICD-10-CM

## 2018-09-18 LAB — POCT URINALYSIS DIPSTICK
Bilirubin, UA: NEGATIVE
Blood, UA: NEGATIVE
Glucose, UA: NEGATIVE
Ketones, UA: NEGATIVE
Leukocytes, UA: NEGATIVE
Nitrite, UA: NEGATIVE
Protein, UA: NEGATIVE
Spec Grav, UA: 1.015 (ref 1.010–1.025)
Urobilinogen, UA: 0.2 E.U./dL
pH, UA: 6 (ref 5.0–8.0)

## 2018-09-18 NOTE — Patient Instructions (Signed)
Health Maintenance After Age 79 After age 79, you are at a higher risk for certain long-term diseases and infections as well as injuries from falls. Falls are a major cause of broken bones and head injuries in people who are older than age 79. Getting regular preventive care can help to keep you healthy and well. Preventive care includes getting regular testing and making lifestyle changes as recommended by your health care provider. Talk with your health care provider about:  Which screenings and tests you should have. A screening is a test that checks for a disease when you have no symptoms.  A diet and exercise plan that is right for you. What should I know about screenings and tests to prevent falls? Screening and testing are the best ways to find a health problem early. Early diagnosis and treatment give you the best chance of managing medical conditions that are common after age 79. Certain conditions and lifestyle choices may make you more likely to have a fall. Your health care provider may recommend:  Regular vision checks. Poor vision and conditions such as cataracts can make you more likely to have a fall. If you wear glasses, make sure to get your prescription updated if your vision changes.  Medicine review. Work with your health care provider to regularly review all of the medicines you are taking, including over-the-counter medicines. Ask your health care provider about any side effects that may make you more likely to have a fall. Tell your health care provider if any medicines that you take make you feel dizzy or sleepy.  Osteoporosis screening. Osteoporosis is a condition that causes the bones to get weaker. This can make the bones weak and cause them to break more easily.  Blood pressure screening. Blood pressure changes and medicines to control blood pressure can make you feel dizzy.  Strength and balance checks. Your health care provider may recommend certain tests to check your  strength and balance while standing, walking, or changing positions.  Foot health exam. Foot pain and numbness, as well as not wearing proper footwear, can make you more likely to have a fall.  Depression screening. You may be more likely to have a fall if you have a fear of falling, feel emotionally low, or feel unable to do activities that you used to do.  Alcohol use screening. Using too much alcohol can affect your balance and may make you more likely to have a fall. What actions can I take to lower my risk of falls? General instructions  Talk with your health care provider about your risks for falling. Tell your health care provider if: ? You fall. Be sure to tell your health care provider about all falls, even ones that seem minor. ? You feel dizzy, sleepy, or off-balance.  Take over-the-counter and prescription medicines only as told by your health care provider. These include any supplements.  Eat a healthy diet and maintain a healthy weight. A healthy diet includes low-fat dairy products, low-fat (lean) meats, and fiber from whole grains, beans, and lots of fruits and vegetables. Home safety  Remove any tripping hazards, such as rugs, cords, and clutter.  Install safety equipment such as grab bars in bathrooms and safety rails on stairs.  Keep rooms and walkways well-lit. Activity   Follow a regular exercise program to stay fit. This will help you maintain your balance. Ask your health care provider what types of exercise are appropriate for you.  If you need a cane or   walker, use it as recommended by your health care provider.  Wear supportive shoes that have nonskid soles. Lifestyle  Do not drink alcohol if your health care provider tells you not to drink.  If you drink alcohol, limit how much you have: ? 0-1 drink a day for women. ? 0-2 drinks a day for men.  Be aware of how much alcohol is in your drink. In the [place] one drink equals one typical bottle of beer (12  oz), one-half glass of wine (5 oz), or one shot of hard liquor (1 oz).  Do not use any products that contain nicotine or tobacco, such as cigarettes and e-cigarettes. If you need help quitting, ask your health care provider. Summary  Having a healthy lifestyle and getting preventive care can help to protect your health and wellness after age 79.  Screening and testing are the best way to find a health problem early and help you avoid having a fall. Early diagnosis and treatment give you the best chance for managing medical conditions that are more common for people who are older than age 79.  Falls are a major cause of broken bones and head injuries in people who are older than age 79. Take precautions to prevent a fall at home.  Work with your health care provider to learn what changes you can make to improve your health and wellness and to prevent falls. This information is not intended to replace advice given to you by your health care provider. Make sure you discuss any questions you have with your health care provider. Document Released: 02/15/2017 Document Revised: 02/15/2017 Document Reviewed: 02/15/2017 Elsevier Interactive Patient Education  2019 [employer] Inc[employer]  

## 2018-09-18 NOTE — Progress Notes (Signed)
Date:  09/18/2018   Name:  Annalaya Wile   DOB:  24-May-1940   MRN:  [id number]   Chief Complaint: Annual Exam (Breast Exam.) Zaineb Nowaczyk is a 78 y.o. female who presents today for her Complete Annual Exam. She feels fairly well. She reports exercising none. She reports she is sleeping fairly well. She denies breast complaints.  She does not get mammograms.  She has aged out of colonoscopies.  Immunizations are up to date. She is fatigued, caring for her husband full time.  She has been B12 def in the past and at one time received B12 injections.  Hyperlipidemia  This is a chronic problem. Recent lipid tests were reviewed and are normal. Pertinent negatives include no chest pain or shortness of breath. Current antihyperlipidemic treatment includes statins. The current treatment provides significant improvement of lipids.  Depression         This is a chronic problem.The problem is unchanged.  Associated symptoms include no fatigue and no headaches.  Past treatments include SNRIs - Serotonin and norepinephrine reuptake inhibitors and other medications.  Compliance with treatment is good.  Previous treatment provided significant relief. HRT - she continues on low dose estrogen daily to control hot flashes.  She sleeps well and has normal mood.  Lab Results  Component Value Date   CREATININE 1.04 (H) 06/14/2017   BUN 13 06/14/2017   NA 142 06/14/2017   K 4.3 06/14/2017   CL 102 06/14/2017   CO2 23 06/14/2017   Lab Results  Component Value Date   CHOL 185 06/14/2017   HDL 57 06/14/2017   LDLCALC 98 06/14/2017   TRIG 150 (H) 06/14/2017   CHOLHDL 3.2 06/14/2017   Lab Results  Component Value Date   TSH 2.870 06/14/2017     Review of Systems  Constitutional: Negative for chills, fatigue and fever.  HENT: Negative for congestion, hearing loss, tinnitus, trouble swallowing and voice change.   Eyes: Negative for visual disturbance.  Respiratory: Negative for cough, chest  tightness, shortness of breath and wheezing.   Cardiovascular: Negative for chest pain, palpitations and leg swelling.  Gastrointestinal: Negative for abdominal pain, constipation, diarrhea and vomiting.  Endocrine: Negative for polydipsia and polyuria.  Genitourinary: Negative for dysuria, frequency, genital sores, vaginal bleeding and vaginal discharge.  Musculoskeletal: Negative for arthralgias, gait problem and joint swelling.  Skin: Negative for color change and rash.  Neurological: Negative for dizziness, tremors, light-headedness and headaches.  Hematological: Negative for adenopathy. Does not bruise/bleed easily.  Psychiatric/Behavioral: Positive for depression. Negative for dysphoric mood and sleep disturbance. The patient is not nervous/anxious.     Patient Active Problem List   Diagnosis Date Noted  . Pain of left hip joint 06/01/2018  . 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 Major depressive disorder with single episode, in partial remission (HCC) 05/31/2016  . Hyperlipidemia, mixed 10/22/2014  . AB (asthmatic bronchitis) 10/22/2014  . Addison anemia 10/22/2014  . Hot flash, menopausal 10/22/2014    No Known Allergies  Past Surgical History:  Procedure Laterality Date  . ABDOMINAL HYSTERECTOMY    . APPENDECTOMY    . BREAST BIOPSY Left    x2-neg  . CATARACT EXTRACTION W/PHACO Left 05/25/2015   Procedure: CATARACT EXTRACTION PHACO AND INTRAOCULAR LENS PLACEMENT (IOC);  Surgeon: Sallee Lange, MD;  Location: [facility] ORS;  Service: Ophthalmology;  Laterality: Left;  [place]: 01:25.9 AP%: 26.3 CDE: 37.92  Lot # [id number]  . CATARACT EXTRACTION W/PHACO Right 07/20/2015   Procedure: CATARACT  EXTRACTION PHACO AND INTRAOCULAR LENS PLACEMENT (IOC);  Surgeon: Sallee LangeSteven Dingeldein, MD;  Location: [facility] ORS;  Service: Ophthalmology;  Laterality: Right;  US      1:12.7 AP%    25.3 CDE    29.53 fluid casette lot # [id number] H  exp 09/15/2016    Social History   Tobacco Use  . Smoking status:  Former Smoker    Packs/day: 0.50    Years: 25.00    Pack years: 12.50    Types: Cigarettes    Last attempt to quit: 1992    Years since quitting: 28.4  . Smokeless tobacco: Never Used  . Tobacco comment: quit early 90's  Substance Use Topics  . Alcohol use: Yes    Alcohol/week: 0.0 standard drinks    Comment: occ.  . Drug use: No     Medication list has been reviewed and updated.  Current Meds  Medication Sig  . ARIPiprazole (ABILIFY) 10 MG tablet Take 5 mg by mouth daily at 2 PM daily at 2 PM.  . desvenlafaxine (PRISTIQ) 100 MG 24 hr tablet Take 100 mg by mouth daily.  . diazepam (VALIUM) 5 MG tablet Take 1 tablet by mouth daily. AM  . estradiol (ESTRACE) 0.5 MG tablet TAKE 1 TABLET BY MOUTH ONCE DAILY.  Marland Kitchen. ibuprofen (ADVIL) 800 MG tablet Take 1 tablet (800 mg total) by mouth 2 (two) times daily as needed.  . simvastatin (ZOCOR) 40 MG tablet TAKE 1 TABLET BY MOUTH ONCE DAILY.    PHQ 2/9 Scores 09/18/2018 06/01/2018 05/18/2018 06/14/2017  PHQ - 2 Score 0 0 0 0  PHQ- 9 Score - - - 0    BP Readings from Last 3 Encounters:  09/18/18 124/70  06/01/18 (!) 146/78  05/18/18 136/80    Physical Exam Vitals signs and nursing note reviewed.  Constitutional:      General: She is not in acute distress.    Appearance: She is well-developed.  HENT:     Head: Normocephalic and atraumatic.     Right Ear: Tympanic membrane and ear canal normal.     Left Ear: Tympanic membrane and ear canal normal.     Nose: Nose normal.     Right Sinus: No maxillary sinus tenderness.     Left Sinus: No maxillary sinus tenderness.     Mouth/Throat:     Mouth: Mucous membranes are moist.     Pharynx: Uvula midline.  Eyes:     General: No scleral icterus.       Right eye: No discharge.        Left eye: No discharge.     Conjunctiva/sclera: Conjunctivae normal.  Neck:     Musculoskeletal: Normal range of motion. No erythema.     Thyroid: No thyromegaly.     Vascular: No carotid bruit.   Cardiovascular:     Rate and Rhythm: Normal rate and regular rhythm.     Pulses: Normal pulses.     Heart sounds: Normal heart sounds. No murmur.  Pulmonary:     Effort: Pulmonary effort is normal. No respiratory distress.     Breath sounds: No wheezing.  Chest:     Breasts:        Right: No mass, nipple discharge, skin change or tenderness.        Left: No mass, nipple discharge, skin change or tenderness.  Abdominal:     General: Bowel sounds are normal.     Palpations: Abdomen is soft.     Tenderness: There is  no abdominal tenderness.  Musculoskeletal: Normal range of motion.     Right lower leg: No edema.     Left lower leg: No edema.       Feet:  Lymphadenopathy:     Cervical: No cervical adenopathy.  Skin:    General: Skin is warm and dry.     Findings: No rash.  Neurological:     Mental Status: She is alert and oriented to person, place, and time.     Cranial Nerves: No cranial nerve deficit.     Sensory: No sensory deficit.     Deep Tendon Reflexes: Reflexes are normal and symmetric.  Psychiatric:        Attention and Perception: Attention normal.        Mood and Affect: Affect is flat.        Speech: Speech normal.        Behavior: Behavior normal.        Thought Content: Thought content normal.        Cognition and Memory: Cognition normal.        Judgment: Judgment normal.     Wt Readings from Last 3 Encounters:  09/18/18 137 lb (62.1 kg)  06/01/18 139 lb (63 kg)  05/18/18 140 lb (63.5 kg)    BP 124/70   Pulse 99   Ht 5\' 1"  (1.549 m)   Wt 137 lb (62.1 kg)   SpO2 98%   BMI 25.89 kg/m   Assessment and Plan: 1. Annual physical exam Normal exam Continue healthy diet - Comprehensive metabolic panel - POCT urinalysis dipstick  2. Encounter for screening mammogram for breast cancer No longer needed  3. Hyperlipidemia, mixed Continue statin therapy - Lipid panel  4. Major depressive disorder with single episode, in partial remission (HCC)  Doing well under Psych care - TSH  5. Hot flash, menopausal Continue HRT  6. B12 nutritional deficiency Check labs and supplement if needed - CBC with Differential/Platelet - Vitamin B12   Partially dictated using Animal nutritionist. Any errors are unintentional.  Bari Edward, MD [facility]  09/18/2018

## 2018-09-19 LAB — CBC WITH DIFFERENTIAL/PLATELET
Basophils Absolute: 0 10*3/uL (ref 0.0–0.2)
Basos: 1 %
EOS (ABSOLUTE): 0.2 10*3/uL (ref 0.0–0.4)
Eos: 3 %
Hematocrit: 41.7 % (ref 34.0–46.6)
Hemoglobin: 14.2 g/dL (ref 11.1–15.9)
Immature Grans (Abs): 0 10*3/uL (ref 0.0–0.1)
Immature Granulocytes: 0 %
Lymphocytes Absolute: 2.5 10*3/uL (ref 0.7–3.1)
Lymphs: 37 %
MCH: 31 pg (ref 26.6–33.0)
MCHC: 34.1 g/dL (ref 31.5–35.7)
MCV: 91 fL (ref 79–97)
Monocytes Absolute: 0.5 10*3/uL (ref 0.1–0.9)
Monocytes: 7 %
Neutrophils Absolute: 3.5 10*3/uL (ref 1.4–7.0)
Neutrophils: 52 %
Platelets: 323 10*3/uL (ref 150–450)
RBC: 4.58 x10E6/uL (ref 3.77–5.28)
RDW: 11.9 % (ref 11.7–15.4)
WBC: 6.6 10*3/uL (ref 3.4–10.8)

## 2018-09-19 LAB — COMPREHENSIVE METABOLIC PANEL
ALT: 12 IU/L (ref 0–32)
AST: 19 IU/L (ref 0–40)
Albumin/Globulin Ratio: 1.8 (ref 1.2–2.2)
Albumin: 4.5 g/dL (ref 3.7–4.7)
Alkaline Phosphatase: 60 IU/L (ref 39–117)
BUN/Creatinine Ratio: 17 (ref 12–28)
BUN: 19 mg/dL (ref 8–27)
Bilirubin Total: 0.3 mg/dL (ref 0.0–1.2)
CO2: 22 mmol/L (ref 20–29)
Calcium: 10.2 mg/dL (ref 8.7–10.3)
Chloride: 101 mmol/L (ref 96–106)
Creatinine, Ser: 1.1 mg/dL — ABNORMAL HIGH (ref 0.57–1.00)
GFR calc Af Amer: 55 mL/min/{1.73_m2} — ABNORMAL LOW (ref >59)
GFR calc non Af Amer: 48 mL/min/{1.73_m2} — ABNORMAL LOW (ref >59)
Globulin, Total: 2.5 g/dL (ref 1.5–4.5)
Glucose: 92 mg/dL (ref 65–99)
Potassium: 4.6 mmol/L (ref 3.5–5.2)
Sodium: 139 mmol/L (ref 134–144)
Total Protein: 7 g/dL (ref 6.0–8.5)

## 2018-09-19 LAB — LIPID PANEL
Chol/HDL Ratio: 2.8 ratio (ref 0.0–4.4)
Cholesterol, Total: 188 mg/dL (ref 100–199)
HDL: 67 mg/dL (ref >39)
LDL Calculated: 96 mg/dL (ref 0–99)
Triglycerides: 127 mg/dL (ref 0–149)
VLDL Cholesterol Cal: 25 mg/dL (ref 5–40)

## 2018-09-19 LAB — VITAMIN B12: Vitamin B-12: 547 pg/mL (ref 232–1245)

## 2018-09-19 LAB — TSH: TSH: 3.79 u[IU]/mL (ref 0.450–4.500)

## 2018-09-24 ENCOUNTER — Ambulatory Visit (INDEPENDENT_AMBULATORY_CARE_PROVIDER_SITE_OTHER): Payer: Medicare Other

## 2018-09-24 VITALS — Ht 67.0 in | Wt 138.0 lb

## 2018-09-24 DIAGNOSIS — Z0000 Encounter for general adult medical examination without abnormal findings: Secondary | ICD-10-CM | POA: Diagnosis not present

## 2018-09-24 NOTE — Patient Instructions (Signed)
Jennifer Knapp , Thank you for taking time to come for your Medicare Wellness Visit. I appreciate your ongoing commitment to your health goals. Please review the following plan we discussed and let me know if I can assist you in the future.   Screening recommendations/referrals: Colonoscopy: no longer required Mammogram: no longer required Bone Density: postponed Recommended yearly ophthalmology/optometry visit for glaucoma screening and checkup Recommended yearly dental visit for hygiene and checkup  Vaccinations: Influenza vaccine: done 02/05/18 Pneumococcal vaccine: done 05/31/16 Tdap vaccine: done 01/14/11 Shingles vaccine: Shingrix discussed. Please contact your pharmacy for coverage information.   Advanced directives: Advance directive discussed with you today. Even though you declined this today please call our office should you change your mind and we can give you the proper paperwork for you to fill out.  Conditions/risks identified: recommend drinking 6-8 glasses of water per day   Next appointment: Please follow up in one year for your Medicare Annual Wellness visit.     Preventive Care 47 Years and Older, Female Preventive care refers to lifestyle choices and visits with your health care provider that can promote health and wellness. What does preventive care include?  A yearly physical exam. This is also called an annual well check.  Dental exams once or twice a year.  Routine eye exams. Ask your health care provider how often you should have your eyes checked.  Personal lifestyle choices, including:  Daily care of your teeth and gums.  Regular physical activity.  Eating a healthy diet.  Avoiding tobacco and drug use.  Limiting alcohol use.  Practicing safe sex.  Taking low-dose aspirin every day.  Taking vitamin and mineral supplements as recommended by your health care provider. What happens during an annual well check? The services and screenings done by  your health care provider during your annual well check will depend on your age, overall health, lifestyle risk factors, and family history of disease. Counseling  Your health care provider may ask you questions about your:  Alcohol use.  Tobacco use.  Drug use.  Emotional well-being.  Home and relationship well-being.  Sexual activity.  Eating habits.  History of falls.  Memory and ability to understand (cognition).  Work and work Statistician.  Reproductive health. Screening  You may have the following tests or measurements:  Height, weight, and BMI.  Blood pressure.  Lipid and cholesterol levels. These may be checked every 5 years, or more frequently if you are over 86 years old.  Skin check.  Lung cancer screening. You may have this screening every year starting at age 63 if you have a 30-pack-year history of smoking and currently smoke or have quit within the past 15 years.  Fecal occult blood test (FOBT) of the stool. You may have this test every year starting at age 110.  Flexible sigmoidoscopy or colonoscopy. You may have a sigmoidoscopy every 5 years or a colonoscopy every 10 years starting at age 37.  Hepatitis C blood test.  Hepatitis B blood test.  Sexually transmitted disease (STD) testing.  Diabetes screening. This is done by checking your blood sugar (glucose) after you have not eaten for a while (fasting). You may have this done every 1-3 years.  Bone density scan. This is done to screen for osteoporosis. You may have this done starting at age 52.  Mammogram. This may be done every 1-2 years. Talk to your health care provider about how often you should have regular mammograms. Talk with your health care provider about  your test results, treatment options, and if necessary, the need for more tests. Vaccines  Your health care provider may recommend certain vaccines, such as:  Influenza vaccine. This is recommended every year.  Tetanus,  diphtheria, and acellular pertussis (Tdap, Td) vaccine. You may need a Td booster every 10 years.  Zoster vaccine. You may need this after age 32.  Pneumococcal 13-valent conjugate (PCV13) vaccine. One dose is recommended after age 30.  Pneumococcal polysaccharide (PPSV23) vaccine. One dose is recommended after age 31. Talk to your health care provider about which screenings and vaccines you need and how often you need them. This information is not intended to replace advice given to you by your health care provider. Make sure you discuss any questions you have with your health care provider. Document Released: 05/01/2015 Document Revised: 12/23/2015 Document Reviewed: 02/03/2015 Elsevier Interactive Patient Education  2017 [place] Prevention in the Home Falls can cause injuries. They can happen to people of all ages. There are many things you can do to make your home safe and to help prevent falls. What can I do on the outside of my home?  Regularly fix the edges of walkways and driveways and fix any cracks.  Remove anything that might make you trip as you walk through a door, such as a raised step or threshold.  Trim any bushes or trees on the path to your home.  Use bright outdoor lighting.  Clear any walking paths of anything that might make someone trip, such as rocks or tools.  Regularly check to see if handrails are loose or broken. Make sure that both sides of any steps have handrails.  Any raised decks and porches should have guardrails on the edges.  Have any leaves, snow, or ice cleared regularly.  Use sand or salt on walking paths during winter.  Clean up any spills in your garage right away. This includes oil or grease spills. What can I do in the bathroom?  Use night lights.  Install grab bars by the toilet and in the tub and shower. Do not use towel bars as grab bars.  Use non-skid mats or decals in the tub or shower.  If you need to sit down in  the shower, use a plastic, non-slip stool.  Keep the floor dry. Clean up any water that spills on the floor as soon as it happens.  Remove soap buildup in the tub or shower regularly.  Attach bath mats securely with double-sided non-slip rug tape.  Do not have throw rugs and other things on the floor that can make you trip. What can I do in the bedroom?  Use night lights.  Make sure that you have a light by your bed that is easy to reach.  Do not use any sheets or blankets that are too big for your bed. They should not hang down onto the floor.  Have a firm chair that has side arms. You can use this for support while you get dressed.  Do not have throw rugs and other things on the floor that can make you trip. What can I do in the kitchen?  Clean up any spills right away.  Avoid walking on wet floors.  Keep items that you use a lot in easy-to-reach places.  If you need to reach something above you, use a strong step stool that has a grab bar.  Keep electrical cords out of the way.  Do not use floor polish or wax  that makes floors slippery. If you must use wax, use non-skid floor wax.  Do not have throw rugs and other things on the floor that can make you trip. What can I do with my stairs?  Do not leave any items on the stairs.  Make sure that there are handrails on both sides of the stairs and use them. Fix handrails that are broken or loose. Make sure that handrails are as long as the stairways.  Check any carpeting to make sure that it is firmly attached to the stairs. Fix any carpet that is loose or worn.  Avoid having throw rugs at the top or bottom of the stairs. If you do have throw rugs, attach them to the floor with carpet tape.  Make sure that you have a light switch at the top of the stairs and the bottom of the stairs. If you do not have them, ask someone to add them for you. What else can I do to help prevent falls?  Wear shoes that:  Do not have high  heels.  Have rubber bottoms.  Are comfortable and fit you well.  Are closed at the toe. Do not wear sandals.  If you use a stepladder:  Make sure that it is fully opened. Do not climb a closed stepladder.  Make sure that both sides of the stepladder are locked into place.  Ask someone to hold it for you, if possible.  Clearly mark and make sure that you can see:  Any grab bars or handrails.  First and last steps.  Where the edge of each step is.  Use tools that help you move around (mobility aids) if they are needed. These include:  Canes.  Walkers.  Scooters.  Crutches.  Turn on the lights when you go into a dark area. Replace any light bulbs as soon as they burn out.  Set up your furniture so you have a clear path. Avoid moving your furniture around.  If any of your floors are uneven, fix them.  If there are any pets around you, be aware of where they are.  Review your medicines with your doctor. Some medicines can make you feel dizzy. This can increase your chance of falling. Ask your doctor what other things that you can do to help prevent falls. This information is not intended to replace advice given to you by your health care provider. Make sure you discuss any questions you have with your health care provider. Document Released: 01/29/2009 Document Revised: 09/10/2015 Document Reviewed: 05/09/2014 Elsevier Interactive Patient Education  2017 [employer].

## 2018-09-24 NOTE — Progress Notes (Addendum)
Subjective:   Jennifer Knapp is a 78 y.o. female who presents for Medicare Annual (Subsequent) preventive examination.  Virtual Visit via Telephone Note  I connected with Jennifer Knapp on 09/24/18 at  2:40 PM EDT by telephone and verified that I am speaking with the correct person using two identifiers.  Medicare Annual Wellness visit completed telephonically due to Covid-19 pandemic.   Location: Patient: home Provider: office   I discussed the limitations, risks, security and privacy concerns of performing an evaluation and management service by telephone and the availability of in person appointments. The patient expressed understanding and agreed to proceed.  Some vital signs may be absent or patient reported. Patient did not have a blood pressure monitor at home to record blood pressure.   Reather LittlerKasey Ventura Hollenbeck, LPN   Review of Systems:   Cardiac Risk Factors include: advanced age (>[id number], 94>65 women)     Objective:     Vitals: Ht 5\' 7"  (1.702 m)   Wt 138 lb (62.6 kg)   BMI 21.61 kg/m   Body mass index is 21.61 kg/m.  Advanced Directives 09/24/2018 05/31/2016 07/20/2015 06/10/2015 05/25/2015 05/11/2015 04/19/2015  Does Patient Have a Medical Advance Directive? No No No Yes Yes Yes Yes  Type of Advance Directive - - - Living will Living will Living will Living will  Does patient want to make changes to medical advance directive? - - - - No - Patient declined - -  Copy of Healthcare Power of Attorney in Chart? - - - - No - copy requested - -  Would patient like information on creating a medical advance directive? No - Patient declined - No - patient declined information - - - -    Tobacco Social History   Tobacco Use  Smoking Status Former Smoker  . Packs/day: 0.50  . Years: 25.00  . Pack years: 12.50  . Types: Cigarettes  . Last attempt to quit: 1992  . Years since quitting: 28.4  Smokeless Tobacco Never Used  Tobacco Comment   quit early 2490's     Counseling given:  Not Answered Comment: quit early 90's   Clinical Intake:  Pre-visit preparation completed: Yes  Pain : 0-10 Pain Score: 5  Pain Type: Chronic pain Pain Location: Leg Pain Orientation: Right, Left Pain Descriptors / Indicators: Aching, Sore Pain Onset: More than a month ago Pain Frequency: Constant     BMI - recorded: 21.61 Nutritional Status: BMI of 19-24  Normal Nutritional Risks: None Diabetes: No  How often do you need to have someone help you when you read instructions, pamphlets, or other written materials from your doctor or pharmacy?: 1 - Never  Interpreter Needed?: No  Information entered by :: Reather LittlerKasey Saryah Loper LPN  Past Medical History:  Diagnosis Date  . Anxiety   . Asthma   . Bronchitis    CHRONIC  . Depression   . Elevated cholesterol   . HLD (hyperlipidemia)    Past Surgical History:  Procedure Laterality Date  . ABDOMINAL HYSTERECTOMY    . APPENDECTOMY    . BREAST BIOPSY Left    x2-neg  . CATARACT EXTRACTION W/PHACO Left 05/25/2015   Procedure: CATARACT EXTRACTION PHACO AND INTRAOCULAR LENS PLACEMENT (IOC);  Surgeon: Sallee LangeSteven Dingeldein, MD;  Location: [facility] ORS;  Service: Ophthalmology;  Laterality: Left;  US: 01:25.9 AP%: 26.3 CDE: 37.92  Lot # [id number] H  . CATARACT EXTRACTION W/PHACO Right 07/20/2015   Procedure: CATARACT EXTRACTION PHACO AND INTRAOCULAR LENS PLACEMENT (IOC);  Surgeon: Sallee LangeSteven Dingeldein,  MD;  Location: [facility] ORS;  Service: Ophthalmology;  Laterality: Right;  US      1:12.7 AP%    25.3 CDE    29.53 fluid casette lot # [id number] H  exp 09/15/2016   Family History  Problem Relation Age of Onset  . Cancer Mother        colon  . Hematuria Mother   . Urinary tract infection Mother   . Depression Father   . Breast cancer Neg Hx   . Kidney cancer Neg Hx   . Bladder Cancer Neg Hx    Social History   Socioeconomic History  . Marital status: Married    Spouse name: Not on file  . Number of children: 1  . Years of education: Not on file   . Highest education level: 10th grade  Occupational History  . Not on file  Social Needs  . Financial resource strain: Not very hard  . Food insecurity:    Worry: Never true    Inability: Never true  . Transportation needs:    Medical: No    Non-medical: No  Tobacco Use  . Smoking status: Former Smoker    Packs/day: 0.50    Years: 25.00    Pack years: 12.50    Types: Cigarettes    Last attempt to quit: 1992    Years since quitting: 28.4  . Smokeless tobacco: Never Used  . Tobacco comment: quit early 90's  Substance and Sexual Activity  . Alcohol use: Not Currently    Alcohol/week: 0.0 standard drinks  . Drug use: No  . Sexual activity: Not Currently  Lifestyle  . Physical activity:    Days per week: 0 days    Minutes per session: 0 min  . Stress: To some extent  Relationships  . Social connections:    Talks on phone: More than three times a week    Gets together: Three times a week    Attends religious service: Never    Active member of club or organization: No    Attends meetings of clubs or organizations: Never    Relationship status: Married  Other Topics Concern  . Not on file  Social History Narrative  . Not on file    Outpatient Encounter Medications as of 09/24/2018  Medication Sig  . ARIPiprazole (ABILIFY) 10 MG tablet Take 5 mg by mouth daily at 2 PM daily at 2 PM.  . desvenlafaxine (PRISTIQ) 50 MG 24 hr tablet Take 50 mg by mouth daily.  . diazepam (VALIUM) 5 MG tablet Take 1 tablet by mouth daily. AM  . estradiol (ESTRACE) 0.5 MG tablet TAKE 1 TABLET BY MOUTH ONCE DAILY.  Marland Kitchen. ibuprofen (ADVIL) 800 MG tablet Take 1 tablet (800 mg total) by mouth 2 (two) times daily as needed.  . simvastatin (ZOCOR) 40 MG tablet TAKE 1 TABLET BY MOUTH ONCE DAILY.  . [DISCONTINUED] desvenlafaxine (PRISTIQ) 100 MG 24 hr tablet Take 100 mg by mouth daily.   No facility-administered encounter medications on file as of 09/24/2018.     Activities of Daily Living In your  present state of health, do you have any difficulty performing the following activities: 09/24/2018  Hearing? N  Comment declines hearing aids  Vision? N  Comment wears glasses  Difficulty concentrating or making decisions? N  Walking or climbing stairs? N  Dressing or bathing? N  Doing errands, shopping? N  Preparing Food and eating ? N  Using the Toilet? N  In the past six months,  have you accidently leaked urine? N  Comment wears pads for protection  Do you have problems with loss of bowel control? N  Managing your Medications? N  Managing your Finances? N  Housekeeping or managing your Housekeeping? N  Some recent data might be hidden    Patient Care Team: Reubin MilanBerglund, Laura H, MD as PCP - General (Internal Medicine) Jacquelin HawkingMillet, Robert, MD as Referring Physician (Psychiatry)    Assessment:   This is a routine wellness examination for Jennifer Knapp.  Exercise Activities and Dietary recommendations Current Exercise Habits: The patient does not participate in regular exercise at present, Exercise limited by: None identified  Goals    . DIET - INCREASE WATER INTAKE     Recommend drinking 6-8 glasses of water per day       Fall Risk Fall Risk  09/24/2018 09/18/2018 06/01/2018 05/18/2018 06/14/2017  Falls in the past year? 0 0 0 0 No  Number falls in past yr: 0 0 0 0 -  Injury with Fall? 0 0 0 0 -  Comment - - - - -  Risk Factor Category  - - - - -  Risk for fall due to : - - Impaired balance/gait;Impaired mobility - -  Follow up Falls prevention discussed Falls evaluation completed Falls evaluation completed Falls evaluation completed -   FALL RISK PREVENTION PERTAINING TO THE HOME:  Any stairs in or around the home? No  If so, do they handrails? No   Home free of loose throw rugs in walkways, pet beds, electrical cords, etc? Yes  Adequate lighting in your home to reduce risk of falls? Yes   ASSISTIVE DEVICES UTILIZED TO PREVENT FALLS:  Life alert? No  Use of a cane, walker or w/c?  No  Grab bars in the bathroom? No  Shower chair or bench in shower? No  Elevated toilet seat or a handicapped toilet? Yes   DME ORDERS:  DME order needed?  No   TIMED UP AND GO:  Was the test performed? No .   Education: Fall risk prevention has been discussed.  Intervention(s) required? No    Depression Screen PHQ 2/9 Scores 09/24/2018 09/18/2018 06/01/2018 05/18/2018  PHQ - 2 Score 0 0 0 0  PHQ- 9 Score - - - -     Cognitive Function pt declines 6CIT for 2020     6CIT Screen 06/14/2017 05/31/2016  What Year? 0 points 0 points  What month? 0 points 0 points  What time? 0 points 0 points  Count back from 20 0 points 0 points  Months in reverse 0 points 0 points  Repeat phrase 0 points 0 points  Total Score 0 0    Immunization History  Administered Date(s) Administered  . Influenza,inj,Quad PF,6+ Mos 12/23/2016  . Influenza-Unspecified 02/05/2018  . Pneumococcal Conjugate-13 05/31/2016  . Pneumococcal-Unspecified 04/18/2004  . Tdap 01/14/2011    Qualifies for Shingles Vaccine? Yes . Due for Shingrix. Education has been provided regarding the importance of this vaccine. Pt has been advised to call insurance company to determine out of pocket expense. Advised may also receive vaccine at local pharmacy or Health Dept. Verbalized acceptance and understanding.  Tdap: Up to date  Flu Vaccine: Up to date  Pneumococcal Vaccine: Up to date   Screening Tests Health Maintenance  Topic Date Due  . DEXA SCAN  06/14/2020 (Originally 07/14/2004)  . INFLUENZA VACCINE  11/17/2018  . TETANUS/TDAP  01/13/2021  . PNA vac Low Risk Adult  Completed    Cancer  Screenings:  Colorectal Screening:  No longer required.   Mammogram: No longer required.   Bone Density: Pt declines this screening.   Lung Cancer Screening: (Low Dose CT Chest recommended if Age 9-80 years, 30 pack-year currently smoking OR have quit w/in 15years.) does not qualify.   Additional Screening:  Hepatitis  C Screening: no longer required  Vision Screening: Recommended annual ophthalmology exams for early detection of glaucoma and other disorders of the eye. Is the patient up to date with their annual eye exam?  Yes  Who is the provider or what is the name of the office in which the pt attends annual eye exams? [place] Screening: Recommended annual dental exams for proper oral hygiene  Community Resource Referral:  CRR required this visit?  No      Plan:     I have personally reviewed and addressed the Medicare Annual Wellness questionnaire and have noted the following in the patient's chart:  A. Medical and social history B. Use of alcohol, tobacco or illicit drugs  C. Current medications and supplements D. Functional ability and status E.  Nutritional status F.  Physical activity G. Advance directives H. List of other physicians I.  Hospitalizations, surgeries, and ER visits in previous 12 months J.  [place] such as hearing and vision if needed, cognitive and depression L. Referrals and appointments   In addition, I have reviewed and discussed with patient certain preventive protocols, quality metrics, and best practice recommendations. A written personalized care plan for preventive services as well as general preventive health recommendations were provided to patient.   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 She is otherwise doing well and appreciative of visit today.

## 2018-09-27 ENCOUNTER — Other Ambulatory Visit: Payer: Self-pay | Admitting: Internal Medicine

## 2018-10-03 ENCOUNTER — Other Ambulatory Visit: Payer: Self-pay | Admitting: Internal Medicine

## 2018-10-03 DIAGNOSIS — L247 Irritant contact dermatitis due to plants, except food: Secondary | ICD-10-CM

## 2018-10-03 MED ORDER — PREDNISONE 10 MG PO TABS: 10.0000 mg | ORAL_TABLET | ORAL | 0 refills | Status: AC

## 2018-10-15 ENCOUNTER — Telehealth: Payer: Self-pay

## 2018-10-15 NOTE — Telephone Encounter (Signed)
Patient informed and verbalized understanding of this.

## 2018-10-15 NOTE — Telephone Encounter (Signed)
No more prednisone,  Now use topical cortisone cream or benadryl cream as needed.

## 2018-10-15 NOTE — Telephone Encounter (Signed)
Patient called saying Dr. Army Melia has been treating her for a rash. The rash has cleared up but she is still itching on both arms. Should be on a few more days of medication?  Please Advise.

## 2018-12-11 DIAGNOSIS — F331 Major depressive disorder, recurrent, moderate: Secondary | ICD-10-CM | POA: Diagnosis not present

## 2018-12-14 ENCOUNTER — Other Ambulatory Visit: Payer: Self-pay | Admitting: Internal Medicine

## 2019-02-13 ENCOUNTER — Other Ambulatory Visit: Payer: Self-pay | Admitting: Internal Medicine

## 2019-03-06 DIAGNOSIS — Z0120 Encounter for dental examination and cleaning without abnormal findings: Secondary | ICD-10-CM | POA: Diagnosis not present

## 2019-05-30 ENCOUNTER — Other Ambulatory Visit: Payer: Self-pay | Admitting: Internal Medicine

## 2019-06-11 DIAGNOSIS — F331 Major depressive disorder, recurrent, moderate: Secondary | ICD-10-CM | POA: Diagnosis not present

## 2019-08-29 ENCOUNTER — Other Ambulatory Visit: Payer: Self-pay | Admitting: Internal Medicine

## 2019-09-20 ENCOUNTER — Encounter: Payer: Medicare Other | Admitting: Internal Medicine

## 2019-09-24 ENCOUNTER — Ambulatory Visit (INDEPENDENT_AMBULATORY_CARE_PROVIDER_SITE_OTHER): Payer: Medicare Other | Admitting: Internal Medicine

## 2019-09-24 ENCOUNTER — Encounter: Payer: Self-pay | Admitting: Internal Medicine

## 2019-09-24 ENCOUNTER — Other Ambulatory Visit: Payer: Self-pay

## 2019-09-24 VITALS — BP 122/74 | HR 92 | Temp 97.9°F | Ht 61.0 in | Wt 143.0 lb

## 2019-09-24 DIAGNOSIS — E782 Mixed hyperlipidemia: Secondary | ICD-10-CM

## 2019-09-24 DIAGNOSIS — G250 Essential tremor: Secondary | ICD-10-CM | POA: Diagnosis not present

## 2019-09-24 DIAGNOSIS — M1712 Unilateral primary osteoarthritis, left knee: Secondary | ICD-10-CM | POA: Diagnosis not present

## 2019-09-24 DIAGNOSIS — Z0000 Encounter for general adult medical examination without abnormal findings: Secondary | ICD-10-CM | POA: Diagnosis not present

## 2019-09-24 DIAGNOSIS — F324 Major depressive disorder, single episode, in partial remission: Secondary | ICD-10-CM

## 2019-09-24 LAB — POCT URINALYSIS DIPSTICK
Bilirubin, UA: NEGATIVE
Blood, UA: NEGATIVE
Glucose, UA: NEGATIVE
Ketones, UA: NEGATIVE
Leukocytes, UA: NEGATIVE
Nitrite, UA: NEGATIVE
Protein, UA: NEGATIVE
Spec Grav, UA: 1.01 (ref 1.010–1.025)
Urobilinogen, UA: 0.2 E.U./dL
pH, UA: 6 (ref 5.0–8.0)

## 2019-09-24 NOTE — Progress Notes (Signed)
Date:  09/24/2019   Name:  Jennifer Knapp   DOB:  [birth year]   MRN:  [id number]   Chief Complaint: Annual Exam (breast exam/ no pap) Jennifer Knapp is a 79 y.o. female who presents today for her Complete Annual Exam. She feels fairly well. She reports exercising none due to knee pain. She reports she is sleeping fairly well. She denies breast issues.  Mammogram 2017 DEXA - none Colonoscopy - aged out Immunization History  Administered Date(s) Administered  . Influenza,inj,Quad PF,6+ Mos 12/23/2016  . Influenza,inj,quad, With Preservative 02/06/2019  . Influenza-Unspecified 02/05/2018  . PFIZER SARS-COV-2 Vaccination 05/03/2019, 05/31/2019  . Pneumococcal Conjugate-13 05/31/2016  . Pneumococcal-Unspecified 04/18/2004  . Tdap 01/14/2011    Depression        This is a chronic problem.The problem is unchanged.  Associated symptoms include no fatigue and no headaches.  Treatments tried: Pristiq and Abilify.  Compliance with treatment is good. Hyperlipidemia This is a chronic problem. The problem is controlled. Pertinent negatives include no chest pain or shortness of breath. Current antihyperlipidemic treatment includes statins. The current treatment provides significant improvement of lipids.  Knee Pain  There was no injury mechanism. The pain is present in the left knee and right knee. The quality of the pain is described as aching. The pain is mild. Associated symptoms include an inability to bear weight and a loss of motion. The symptoms are aggravated by movement and weight bearing.    Lab Results  Component Value Date   CREATININE 1.10 (H) 09/18/2018   BUN 19 09/18/2018   NA 139 09/18/2018   K 4.6 09/18/2018   CL 101 09/18/2018   CO2 22 09/18/2018   Lab Results  Component Value Date   CHOL 188 09/18/2018   HDL 67 09/18/2018   LDLCALC 96 09/18/2018   TRIG 127 09/18/2018   CHOLHDL 2.8 09/18/2018   Lab Results  Component Value Date   TSH 3.790 09/18/2018    No results found for: HGBA1C Lab Results  Component Value Date   WBC 6.6 09/18/2018   HGB 14.2 09/18/2018   HCT 41.7 09/18/2018   MCV 91 09/18/2018   PLT 323 09/18/2018   Lab Results  Component Value Date   ALT 12 09/18/2018   AST 19 09/18/2018   ALKPHOS 60 09/18/2018   BILITOT 0.3 09/18/2018     Review of Systems  Constitutional: Negative for chills, fatigue and fever.  HENT: Negative for congestion, hearing loss, tinnitus, trouble swallowing and voice change.   Eyes: Negative for visual disturbance.  Respiratory: Negative for cough, chest tightness, shortness of breath and wheezing.   Cardiovascular: Negative for chest pain, palpitations and leg swelling.  Gastrointestinal: Negative for abdominal pain, constipation, diarrhea and vomiting.  Endocrine: Negative for polydipsia and polyuria.  Genitourinary: Negative for dysuria, frequency, genital sores, vaginal bleeding and vaginal discharge.  Musculoskeletal: Positive for arthralgias and gait problem. Negative for joint swelling.  Skin: Negative for color change and rash.  Neurological: Positive for tremors. Negative for dizziness, light-headedness and headaches.  Hematological: Negative for adenopathy. Does not bruise/bleed easily.  Psychiatric/Behavioral: Positive for depression and sleep disturbance. Negative for dysphoric mood. The patient is not nervous/anxious.     Patient Active Problem List   Diagnosis Date Noted  . Pain of left hip joint 06/01/2018  . 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 Major depressive disorder with single episode, in partial remission (HCC) 05/31/2016  . Hyperlipidemia, mixed 10/22/2014  . B12 nutritional deficiency 10/22/2014  .  Hot flash, menopausal 10/22/2014    No Known Allergies  Past Surgical History:  Procedure Laterality Date  . ABDOMINAL HYSTERECTOMY    . APPENDECTOMY    . BREAST BIOPSY Left    x2-neg  . CATARACT EXTRACTION W/PHACO Left 05/25/2015   Procedure: CATARACT  EXTRACTION PHACO AND INTRAOCULAR LENS PLACEMENT (IOC);  Surgeon: Sallee Lange, MD;  Location: [facility] ORS;  Service: Ophthalmology;  Laterality: Left;  [place]: 01:25.9 AP%: 26.3 CDE: 37.92  Lot # [id number]  . CATARACT EXTRACTION W/PHACO Right 07/20/2015   Procedure: CATARACT EXTRACTION PHACO AND INTRAOCULAR LENS PLACEMENT (IOC);  Surgeon: Sallee Lange, MD;  Location: [facility] ORS;  Service: Ophthalmology;  Laterality: Right;  [place]      1:12.7 AP%    25.3 CDE    29.53 fluid casette lot # [id number] H  exp 09/15/2016    Social History   Tobacco Use  . Smoking status: Former Smoker    Packs/day: 0.50    Years: 25.00    Pack years: 12.50    Types: Cigarettes    Quit date: 1992    Years since quitting: 29.4  . Smokeless tobacco: Never Used  . Tobacco comment: quit early 90's  Substance Use Topics  . Alcohol use: Not Currently    Alcohol/week: 0.0 standard drinks  . Drug use: No     Medication list has been reviewed and updated.  Current Meds  Medication Sig  . acetaminophen (TYLENOL) 500 MG tablet Take 500 mg by mouth in the morning and at bedtime.  . ARIPiprazole (ABILIFY) 10 MG tablet Take 5 mg by mouth daily at 2 PM daily at 2 PM.  . desvenlafaxine (PRISTIQ) 50 MG 24 hr tablet Take 50 mg by mouth daily.  . diazepam (VALIUM) 5 MG tablet Take 1 tablet by mouth daily. AM  . estradiol (ESTRACE) 0.5 MG tablet TAKE 1 TABLET BY MOUTH ONCE DAILY.  . simvastatin (ZOCOR) 40 MG tablet TAKE 1 TABLET BY MOUTH ONCE DAILY FOR CHOLESTEROL    PHQ 2/9 Scores 09/24/2019 09/24/2018 09/18/2018 06/01/2018  PHQ - 2 Score 0 0 0 0  PHQ- 9 Score 0 - - -    BP Readings from Last 3 Encounters:  09/24/19 122/74  09/18/18 124/70  06/01/18 (!) 146/78    Physical Exam Vitals and nursing note reviewed.  Constitutional:      General: She is not in acute distress.    Appearance: She is well-developed.  HENT:     Head: Normocephalic and atraumatic.     Right Ear: Tympanic membrane and ear canal normal.      Left Ear: Tympanic membrane and ear canal normal.     Nose:     Right Sinus: No maxillary sinus tenderness.     Left Sinus: No maxillary sinus tenderness.  Eyes:     General: No scleral icterus.       Right eye: No discharge.        Left eye: No discharge.     Conjunctiva/sclera: Conjunctivae normal.  Neck:     Thyroid: No thyromegaly.     Vascular: No carotid bruit.  Cardiovascular:     Rate and Rhythm: Normal rate and regular rhythm.     Pulses: Normal pulses.     Heart sounds: Normal heart sounds.  Pulmonary:     Effort: Pulmonary effort is normal. No respiratory distress.     Breath sounds: No wheezing.  Chest:     Breasts:        Right: No  mass, nipple discharge, skin change or tenderness.        Left: No mass, nipple discharge, skin change or tenderness.  Abdominal:     General: Bowel sounds are normal.     Palpations: Abdomen is soft.     Tenderness: There is no abdominal tenderness.  Musculoskeletal:     Cervical back: Normal range of motion. No erythema.     Right knee: No effusion, erythema, ecchymosis, bony tenderness or crepitus.     Left knee: Bony tenderness and crepitus present. No effusion, erythema or ecchymosis. Decreased range of motion.     Right lower leg: No edema.     Left lower leg: No edema.  Lymphadenopathy:     Cervical: No cervical adenopathy.  Skin:    General: Skin is warm and dry.     Findings: No rash.  Neurological:     Mental Status: She is alert and oriented to person, place, and time.     Cranial Nerves: No cranial nerve deficit.     Sensory: No sensory deficit.     Motor: Tremor (resting fine tremor that extinguishes with movement) present. No seizure activity.     Coordination: Coordination is intact.     Gait: Gait is intact.     Deep Tendon Reflexes: Reflexes are normal and symmetric.  Psychiatric:        Speech: Speech normal.        Behavior: Behavior normal.        Thought Content: Thought content normal.     Wt Readings  from Last 3 Encounters:  09/24/19 143 lb (64.9 kg)  09/24/18 138 lb (62.6 kg)  09/18/18 137 lb (62.1 kg)    BP 122/74   Pulse 92   Temp 97.9 F (36.6 C) (Oral)   Ht 5\' 1"  (1.549 m)   Wt 143 lb (64.9 kg)   SpO2 95%   BMI 27.02 kg/m   Assessment and Plan: 1. Annual physical exam Normal exam - CBC with Differential/Platelet - POCT urinalysis dipstick  2. Major depressive disorder with single episode, in partial remission (HCC) Clinically stable on current regimen with good control of symptoms, No SI or HI. Will continue current therapy and psych care - TSH  3. Hyperlipidemia, mixed Tolerating statin medication without side effects at this time Continue same therapy without change at this time. - Comprehensive metabolic panel - Lipid panel  4. Benign essential tremor Noted about 6 months ago and only slightly worse Extinguished with alcohol intake and intentional movement No evidence of pathology but will monitor and refer to Neurology if worsening  5. Primary osteoarthritis of left knee Continue tylenol 1000 mg bid - Ambulatory referral to Orthopedic Surgery   Partially dictated using Dragon software. Any errors are unintentional.  Halina Maidens, MD [facility]  09/24/2019

## 2019-09-24 NOTE — Patient Instructions (Signed)
Try to reduce the amount of estrogen that you take.  Move to M-W-F dosing and see how you feel.

## 2019-09-25 ENCOUNTER — Ambulatory Visit: Payer: Medicare Other

## 2019-09-25 LAB — CBC WITH DIFFERENTIAL/PLATELET
Basophils Absolute: 0 10*3/uL (ref 0.0–0.2)
Basos: 1 %
EOS (ABSOLUTE): 0.2 10*3/uL (ref 0.0–0.4)
Eos: 2 %
Hematocrit: 42 % (ref 34.0–46.6)
Hemoglobin: 13.8 g/dL (ref 11.1–15.9)
Immature Grans (Abs): 0 10*3/uL (ref 0.0–0.1)
Immature Granulocytes: 0 %
Lymphocytes Absolute: 2.8 10*3/uL (ref 0.7–3.1)
Lymphs: 37 %
MCH: 31 pg (ref 26.6–33.0)
MCHC: 32.9 g/dL (ref 31.5–35.7)
MCV: 94 fL (ref 79–97)
Monocytes Absolute: 0.4 10*3/uL (ref 0.1–0.9)
Monocytes: 6 %
Neutrophils Absolute: 4.2 10*3/uL (ref 1.4–7.0)
Neutrophils: 54 %
Platelets: 329 10*3/uL (ref 150–450)
RBC: 4.45 x10E6/uL (ref 3.77–5.28)
RDW: 12.2 % (ref 11.7–15.4)
WBC: 7.6 10*3/uL (ref 3.4–10.8)

## 2019-09-25 LAB — TSH: TSH: 4.58 u[IU]/mL — ABNORMAL HIGH (ref 0.450–4.500)

## 2019-09-25 LAB — COMPREHENSIVE METABOLIC PANEL
ALT: 10 IU/L (ref 0–32)
AST: 20 IU/L (ref 0–40)
Albumin/Globulin Ratio: 1.6 (ref 1.2–2.2)
Albumin: 4.4 g/dL (ref 3.7–4.7)
Alkaline Phosphatase: 65 IU/L (ref 48–121)
BUN/Creatinine Ratio: 15 (ref 12–28)
BUN: 15 mg/dL (ref 8–27)
Bilirubin Total: 0.2 mg/dL (ref 0.0–1.2)
CO2: 23 mmol/L (ref 20–29)
Calcium: 10.3 mg/dL (ref 8.7–10.3)
Chloride: 102 mmol/L (ref 96–106)
Creatinine, Ser: 1.03 mg/dL — ABNORMAL HIGH (ref 0.57–1.00)
GFR calc Af Amer: 59 mL/min/{1.73_m2} — ABNORMAL LOW (ref >59)
GFR calc non Af Amer: 51 mL/min/{1.73_m2} — ABNORMAL LOW (ref >59)
Globulin, Total: 2.8 g/dL (ref 1.5–4.5)
Glucose: 103 mg/dL — ABNORMAL HIGH (ref 65–99)
Potassium: 4.6 mmol/L (ref 3.5–5.2)
Sodium: 139 mmol/L (ref 134–144)
Total Protein: 7.2 g/dL (ref 6.0–8.5)

## 2019-09-25 LAB — LIPID PANEL
Chol/HDL Ratio: 3.4 ratio (ref 0.0–4.4)
Cholesterol, Total: 199 mg/dL (ref 100–199)
HDL: 59 mg/dL (ref >39)
LDL Chol Calc (NIH): 88 mg/dL (ref 0–99)
Triglycerides: 315 mg/dL — ABNORMAL HIGH (ref 0–149)
VLDL Cholesterol Cal: 52 mg/dL — ABNORMAL HIGH (ref 5–40)

## 2019-09-26 ENCOUNTER — Telehealth: Payer: Self-pay | Admitting: *Deleted

## 2019-09-26 NOTE — Progress Notes (Signed)
Pt given results per Dr Judithann Graves "Labs are all normal except for high triglycerides. May need to begin medication if can not improved with low fat diet. Please return fasting in 6 months to recheck"; the pt states she has already made a follow up appt; the pt also say that she talked with Dr Judithann Graves and was told she may need to be seen by ortho; the pt says she would like to be seen by an orthopedist in [place]; she would like to know how to make an appt; the pt can be contacted at [phone].

## 2019-09-26 NOTE — Telephone Encounter (Signed)
Called pt told her that a referral has already been placed for [place] and they should be contacting her to make a appt. Pt verbalized understanding.  KP

## 2019-09-26 NOTE — Telephone Encounter (Signed)
Pt given results per Dr Berglund "Labs are all normal except for high triglycerides.  May need to begin medication if can not improved with low fat diet.  Please return fasting in 6 months to recheck"; the pt states she has already made a follow up appt; the pt also say that she talked with Dr Berglund and was told she may need to be seen by ortho; the pt says she would like to be seen by an orthopedist in [place]; she would like to know how to make an appt; the pt can be contacted at [phone].

## 2019-10-02 DIAGNOSIS — M4316 Spondylolisthesis, lumbar region: Secondary | ICD-10-CM | POA: Diagnosis not present

## 2019-10-02 DIAGNOSIS — M1712 Unilateral primary osteoarthritis, left knee: Secondary | ICD-10-CM | POA: Diagnosis not present

## 2019-10-07 ENCOUNTER — Ambulatory Visit (INDEPENDENT_AMBULATORY_CARE_PROVIDER_SITE_OTHER): Payer: Medicare Other

## 2019-10-07 DIAGNOSIS — Z0000 Encounter for general adult medical examination without abnormal findings: Secondary | ICD-10-CM | POA: Diagnosis not present

## 2019-10-07 NOTE — Progress Notes (Signed)
Subjective:   Jennifer Knapp is a 79 y.o. female who presents for Medicare Annual (Subsequent) preventive examination.  Virtual Visit via Telephone Note  I connected with  Jennifer Knapp on 10/07/19 at  1:20 PM EDT by telephone and verified that I am speaking with the correct person using two identifiers.  Medicare Annual Wellness visit completed telephonically due to Covid-19 pandemic.   Location: Patient: home Provider: office   I discussed the limitations, risks, security and privacy concerns of performing an evaluation and management service by telephone and the availability of in person appointments. The patient expressed understanding and agreed to proceed.  Unable to perform video visit due to video visit attempted and failed and/or patient does not have video capability.   Some vital signs may be absent or patient reported.   Reather Littler, LPN    Review of Systems     Cardiac Risk Factors include: advanced age (>9men, >72 women)     Objective:    There were no vitals filed for this visit. There is no height or weight on file to calculate BMI.  Advanced Directives 10/07/2019 09/24/2018 05/31/2016 07/20/2015 06/10/2015 05/25/2015 05/11/2015  Does Patient Have a Medical Advance Directive? No No No No Yes Yes Yes  Type of Advance Directive - - - - Living will Living will Living will  Does patient want to make changes to medical advance directive? - - - - - No - Patient declined -  Copy of Healthcare Power of Attorney in Chart? - - - - - No - copy requested -  Would patient like information on creating a medical advance directive? Yes (MAU/Ambulatory/Procedural Areas - Information given) No - Patient declined - No - patient declined information - - -    Current Medications (verified) Outpatient Encounter Medications as of 10/07/2019  Medication Sig   acetaminophen (TYLENOL) 500 MG tablet Take 500 mg by mouth in the morning and at bedtime.   ARIPiprazole (ABILIFY) 10  MG tablet Take 5 mg by mouth daily at 2 PM daily at 2 PM.   desvenlafaxine (PRISTIQ) 50 MG 24 hr tablet Take 50 mg by mouth daily.   diazepam (VALIUM) 5 MG tablet Take 1 tablet by mouth daily. AM   estradiol (ESTRACE) 0.5 MG tablet TAKE 1 TABLET BY MOUTH ONCE DAILY. (Patient taking differently: Pt taking 3 times per week)   simvastatin (ZOCOR) 40 MG tablet TAKE 1 TABLET BY MOUTH ONCE DAILY FOR CHOLESTEROL   No facility-administered encounter medications on file as of 10/07/2019.    Allergies (verified) Patient has no known allergies.   History: Past Medical History:  Diagnosis Date   AB (asthmatic bronchitis) 10/22/2014   Anxiety    Arthritis    Asthma    Bronchitis    CHRONIC   Depression    Elevated cholesterol    HLD (hyperlipidemia)    Past Surgical History:  Procedure Laterality Date   ABDOMINAL HYSTERECTOMY     APPENDECTOMY     BREAST BIOPSY Left    x2-neg   CATARACT EXTRACTION W/PHACO Left 05/25/2015   Procedure: CATARACT EXTRACTION PHACO AND INTRAOCULAR LENS PLACEMENT (IOC);  Surgeon: Sallee Lange, MD;  Location: [facility] ORS;  Service: Ophthalmology;  Laterality: Left;  [place]: 01:25.9 AP%: 26.3 CDE: 37.92  Lot # [id number]   CATARACT EXTRACTION W/PHACO Right 07/20/2015   Procedure: CATARACT EXTRACTION PHACO AND INTRAOCULAR LENS PLACEMENT (IOC);  Surgeon: Sallee Lange, MD;  Location: [facility] ORS;  Service: Ophthalmology;  Laterality: Right;  [place]  1:12.7 AP%    25.3 CDE    29.53 fluid casette lot # [id number]  exp 09/15/2016   Family History  Problem Relation Age of Onset   Cancer Mother        colon   Hematuria Mother    Urinary tract infection Mother    Depression Father    Breast cancer Neg Hx    Kidney cancer Neg Hx    Bladder Cancer Neg Hx    Social History   Socioeconomic History   Marital status: Married    Spouse name: Not on file   Number of children: 1   Years of education: Not on file   Highest education level: 10th  grade  Occupational History   Not on file  Tobacco Use   Smoking status: Former Smoker    Packs/day: 0.50    Years: 25.00    Pack years: 12.50    Types: Cigarettes    Quit date: 1992    Years since quitting: 29.4   Smokeless tobacco: Never Used   Tobacco comment: quit early 90's  Vaping Use   Vaping Use: Never used  Substance and Sexual Activity   Alcohol use: Not Currently    Alcohol/week: 0.0 standard drinks   Drug use: No   Sexual activity: Not Currently  Other Topics Concern   Not on file  Social History Narrative   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   Social Determinants of Radio broadcast assistant Strain: Low Risk    Difficulty of Paying Living Expenses: Not very hard  Food Insecurity: No Food Insecurity   Worried About Charity fundraiser in the Last Year: Never true   Ran Out of Food in the Last Year: Never true  Transportation Needs: No Transportation Needs   Lack of Transportation (Medical): No   Lack of Transportation (Non-Medical): No  Physical Activity: Inactive   Days of Exercise per Week: 0 days   Minutes of Exercise per Session: 0 min  Stress: No Stress Concern Present   Feeling of Stress : Only a little  Social Connections: Moderately Isolated   Frequency of Communication with Friends and Family: More than three times a week   Frequency of Social Gatherings with Friends and Family: Three times a week   Attends Religious Services: Never   Active Member of Clubs or Organizations: No   Attends Music therapist: Never   Marital Status: Married    Tobacco Counseling Counseling given: Not Answered Comment: quit early 90's   Clinical Intake:  Pre-visit preparation completed: Yes  Pain : No/denies pain     Nutritional Risks: None Diabetes: No  How often do you need to have someone help you when you read instructions, pamphlets, or other written materials from your doctor or pharmacy?: 1 -  Never    Interpreter Needed?: No  Information entered by :: Clemetine Marker LPN   Activities of Daily Living In your present state of health, do you have any difficulty performing the following activities: 10/07/2019  Hearing? N  Comment declines hearing aids  Vision? N  Difficulty concentrating or making decisions? N  Walking or climbing stairs? N  Dressing or bathing? N  Doing errands, shopping? N  Preparing Food and eating ? N  Using the Toilet? N  In the past six months, have you accidently leaked urine? Y  Comment wears pad for protection  Do you have problems with loss of bowel control? N  Managing your  Medications? N  Managing your Finances? N  Housekeeping or managing your Housekeeping? N  Some recent data might be hidden    Patient Care Team: Reubin Milan, MD as PCP - General (Internal Medicine) Jacquelin Hawking, MD as Referring Physician (Psychiatry)  Indicate any recent Medical Services you may have received from other than [facility] providers in the past year (date may be approximate).     Assessment:   This is a routine wellness examination for Jennifer Knapp.  Hearing/Vision screen  Hearing Screening   125Hz  250Hz  500Hz  1000Hz  2000Hz  3000Hz  4000Hz  6000Hz  8000Hz   Right ear:           Left ear:           Comments: Pt denies hearing difficulty  Vision Screening Comments: Past due for eye exam at [facility]  Dietary issues and exercise activities discussed: Current Exercise Habits: The patient does not participate in regular exercise at present, Exercise limited by: orthopedic condition(s)  Goals     DIET - INCREASE WATER INTAKE     Recommend drinking 6-8 glasses of water per day      Depression Screen [facility] 2/9 Scores 10/07/2019 09/24/2019 09/24/2018 09/18/2018 06/01/2018 05/18/2018 06/14/2017  PHQ - 2 Score 2 0 0 0 0 0 0  PHQ- 9 Score 7 0 - - - - 0    Fall Risk Fall Risk  10/07/2019 09/24/2019 09/24/2018 09/18/2018 06/01/2018  Falls in the past year? 0 0 0 0 0   Number falls in past yr: 0 0 0 0 0  Injury with Fall? 0 0 0 0 0  Comment - - - - -  Risk Factor Category  - - - - -  Risk for fall due to : No Fall Risks No Fall Risks - - Impaired balance/gait;Impaired mobility  Follow up Falls prevention discussed Falls evaluation completed Falls prevention discussed Falls evaluation completed Falls evaluation completed    Any stairs in or around the home? Yes  If so, are there any without handrails? Yes  Home free of loose throw rugs in walkways, pet beds, electrical cords, etc? Yes  Adequate lighting in your home to reduce risk of falls? Yes   ASSISTIVE DEVICES UTILIZED TO PREVENT FALLS:  Life alert? No  Use of a cane, walker or w/c? No  Grab bars in the bathroom? No  Shower chair or bench in shower? No  Elevated toilet seat or a handicapped toilet? Yes   TIMED UP AND GO:  Was the test performed? No . Telephonic visit.   Cognitive Function:     6CIT Screen 10/07/2019 06/14/2017 05/31/2016  What Year? 0 points 0 points 0 points  What month? 0 points 0 points 0 points  What time? 0 points 0 points 0 points  Count back from 20 0 points 0 points 0 points  Months in reverse 0 points 0 points 0 points  Repeat phrase 0 points 0 points 0 points  Total Score 0 0 0    Immunizations Immunization History  Administered Date(s) Administered   Influenza,inj,Quad PF,6+ Mos 12/23/2016   Influenza,inj,quad, With Preservative 02/06/2019   Influenza-Unspecified 02/05/2018   PFIZER SARS-COV-2 Vaccination 05/03/2019, 05/31/2019   Pneumococcal Conjugate-13 05/31/2016   Pneumococcal-Unspecified 04/18/2004   Tdap 01/14/2011    TDAP status: Up to date Flu Vaccine status: Up to date Pneumococcal vaccine status: Up to date Covid-19 vaccine status: Completed vaccines  Qualifies for Shingles Vaccine? Yes   Zostavax completed No   Shingrix Completed?: No.  Education has been provided regarding the importance of this vaccine. Patient has been  advised to call insurance company to determine out of pocket expense if they have not yet received this vaccine. Advised may also receive vaccine at local pharmacy or Health Dept. Verbalized acceptance and understanding.  Screening Tests Health Maintenance  Topic Date Due   DEXA SCAN  06/14/2020 (Originally 07/14/2004)   INFLUENZA VACCINE  11/17/2019   TETANUS/TDAP  01/13/2021   COVID-19 Vaccine  Completed   PNA vac Low Risk Adult  Completed    Health Maintenance  There are no preventive care reminders to display for this patient.  Colorectal cancer screening: No longer required.  Mammogram status: Completed 12/30/15. Repeat every year Bone Density screening: pt declines  Lung Cancer Screening: (Low Dose CT Chest recommended if Age 42-80 years, 30 pack-year currently smoking OR have quit w/in 15years.) does not qualify.    Additional Screening:  Hepatitis C Screening: no longer required  Vision Screening: Recommended annual ophthalmology exams for early detection of glaucoma and other disorders of the eye. Is the patient up to date with their annual eye exam?  No  Who is the provider or what is the name of the office in which the patient attends annual eye exams? [facility]  Dental Screening: Recommended annual dental exams for proper oral hygiene  Community Resource Referral / Chronic Care Management: CRR required this visit?  No   CCM required this visit?  No      Plan:   I have personally reviewed and noted the following in the patients chart:    Medical and social history  Use of alcohol, tobacco or illicit drugs   Current medications and supplements  Functional ability and status  Nutritional status  Physical activity  Advanced directives  List of other physicians  Hospitalizations, surgeries, and ER visits in previous 12 months  Vitals  Screenings to include cognitive, depression, and falls  Referrals and appointments  In  addition, I have reviewed and discussed with patient certain preventive protocols, quality metrics, and best practice recommendations. A written personalized care plan for preventive services as well as general preventive health recommendations were provided to patient.     Reather Littler, LPN   [id number]   Nurse Notes: none

## 2019-10-07 NOTE — Patient Instructions (Signed)
Jennifer Knapp , Thank you for taking time to come for your Medicare Wellness Visit. I appreciate your ongoing commitment to your health goals. Please review the following plan we discussed and let me know if I can assist you in the future.   Screening recommendations/referrals: Colonoscopy: no longer required Mammogram: no longer required Bone Density: no longer required  Recommended yearly ophthalmology/optometry visit for glaucoma screening and checkup Recommended yearly dental visit for hygiene and checkup  Vaccinations: Influenza vaccine: done 02/06/19 Pneumococcal vaccine: done 05/31/16 Tdap vaccine: done 01/14/11 Shingles vaccine: Shingrix discussed. Please contact your pharmacy for coverage information.  Covid-19 vaccine: done 05/03/19 7 05/31/19  Advanced directives: Advance directive discussed with you today. I have provided a copy for you to complete at home and have notarized. Once this is complete please bring a copy in to our office so we can scan it into your chart.  Conditions/risks identified: Recommend drinking 6-8 glasses of water per day   Next appointment: Please follow up in one year for your Medicare Annual Wellness visit.     Preventive Care 80 Years and Older, Female Preventive care refers to lifestyle choices and visits with your health care provider that can promote health and wellness. What does preventive care include?  A yearly physical exam. This is also called an annual well check.  Dental exams once or twice a year.  Routine eye exams. Ask your health care provider how often you should have your eyes checked.  Personal lifestyle choices, including:  Daily care of your teeth and gums.  Regular physical activity.  Eating a healthy diet.  Avoiding tobacco and drug use.  Limiting alcohol use.  Practicing safe sex.  Taking low-dose aspirin every day.  Taking vitamin and mineral supplements as recommended by your health care provider. What  happens during an annual well check? The services and screenings done by your health care provider during your annual well check will depend on your age, overall health, lifestyle risk factors, and family history of disease. Counseling  Your health care provider may ask you questions about your:  Alcohol use.  Tobacco use.  Drug use.  Emotional well-being.  Home and relationship well-being.  Sexual activity.  Eating habits.  History of falls.  Memory and ability to understand (cognition).  Work and work Astronomer.  Reproductive health. Screening  You may have the following tests or measurements:  Height, weight, and BMI.  Blood pressure.  Lipid and cholesterol levels. These may be checked every 5 years, or more frequently if you are over 80 years old.  Skin check.  Lung cancer screening. You may have this screening every year starting at age 15 if you have a 30-pack-year history of smoking and currently smoke or have quit within the past 15 years.  Fecal occult blood test (FOBT) of the stool. You may have this test every year starting at age 71.  Flexible sigmoidoscopy or colonoscopy. You may have a sigmoidoscopy every 5 years or a colonoscopy every 10 years starting at age 57.  Hepatitis C blood test.  Hepatitis B blood test.  Sexually transmitted disease (STD) testing.  Diabetes screening. This is done by checking your blood sugar (glucose) after you have not eaten for a while (fasting). You may have this done every 1-3 years.  Bone density scan. This is done to screen for osteoporosis. You may have this done starting at age 18.  Mammogram. This may be done every 1-2 years. Talk to your health care provider  about how often you should have regular mammograms. Talk with your health care provider about your test results, treatment options, and if necessary, the need for more tests. Vaccines  Your health care provider may recommend certain vaccines, such  as:  Influenza vaccine. This is recommended every year.  Tetanus, diphtheria, and acellular pertussis (Tdap, Td) vaccine. You may need a Td booster every 10 years.  Shingles vaccine. You may need this after age 68.  Pneumococcal 13-valent conjugate (PCV13) vaccine. One dose is recommended after age 12.  Pneumococcal polysaccharide (PPSV23) vaccine. One dose is recommended after age 37. Talk to your health care provider about which screenings and vaccines you need and how often you need them. This information is not intended to replace advice given to you by your health care provider. Make sure you discuss any questions you have with your health care provider. Document Released: 05/01/2015 Document Revised: 12/23/2015 Document Reviewed: 02/03/2015 Elsevier Interactive Patient Education  2017 [place] Prevention in the Home Falls can cause injuries. They can happen to people of all ages. There are many things you can do to make your home safe and to help prevent falls. What can I do on the outside of my home?  Regularly fix the edges of walkways and driveways and fix any cracks.  Remove anything that might make you trip as you walk through a door, such as a raised step or threshold.  Trim any bushes or trees on the path to your home.  Use bright outdoor lighting.  Clear any walking paths of anything that might make someone trip, such as rocks or tools.  Regularly check to see if handrails are loose or broken. Make sure that both sides of any steps have handrails.  Any raised decks and porches should have guardrails on the edges.  Have any leaves, snow, or ice cleared regularly.  Use sand or salt on walking paths during winter.  Clean up any spills in your garage right away. This includes oil or grease spills. What can I do in the bathroom?  Use night lights.  Install grab bars by the toilet and in the tub and shower. Do not use towel bars as grab bars.  Use  non-skid mats or decals in the tub or shower.  If you need to sit down in the shower, use a plastic, non-slip stool.  Keep the floor dry. Clean up any water that spills on the floor as soon as it happens.  Remove soap buildup in the tub or shower regularly.  Attach bath mats securely with double-sided non-slip rug tape.  Do not have throw rugs and other things on the floor that can make you trip. What can I do in the bedroom?  Use night lights.  Make sure that you have a light by your bed that is easy to reach.  Do not use any sheets or blankets that are too big for your bed. They should not hang down onto the floor.  Have a firm chair that has side arms. You can use this for support while you get dressed.  Do not have throw rugs and other things on the floor that can make you trip. What can I do in the kitchen?  Clean up any spills right away.  Avoid walking on wet floors.  Keep items that you use a lot in easy-to-reach places.  If you need to reach something above you, use a strong step stool that has a grab bar.  Keep electrical cords out of the way.  Do not use floor polish or wax that makes floors slippery. If you must use wax, use non-skid floor wax.  Do not have throw rugs and other things on the floor that can make you trip. What can I do with my stairs?  Do not leave any items on the stairs.  Make sure that there are handrails on both sides of the stairs and use them. Fix handrails that are broken or loose. Make sure that handrails are as long as the stairways.  Check any carpeting to make sure that it is firmly attached to the stairs. Fix any carpet that is loose or worn.  Avoid having throw rugs at the top or bottom of the stairs. If you do have throw rugs, attach them to the floor with carpet tape.  Make sure that you have a light switch at the top of the stairs and the bottom of the stairs. If you do not have them, ask someone to add them for you. What  else can I do to help prevent falls?  Wear shoes that:  Do not have high heels.  Have rubber bottoms.  Are comfortable and fit you well.  Are closed at the toe. Do not wear sandals.  If you use a stepladder:  Make sure that it is fully opened. Do not climb a closed stepladder.  Make sure that both sides of the stepladder are locked into place.  Ask someone to hold it for you, if possible.  Clearly mark and make sure that you can see:  Any grab bars or handrails.  First and last steps.  Where the edge of each step is.  Use tools that help you move around (mobility aids) if they are needed. These include:  Canes.  Walkers.  Scooters.  Crutches.  Turn on the lights when you go into a dark area. Replace any light bulbs as soon as they burn out.  Set up your furniture so you have a clear path. Avoid moving your furniture around.  If any of your floors are uneven, fix them.  If there are any pets around you, be aware of where they are.  Review your medicines with your doctor. Some medicines can make you feel dizzy. This can increase your chance of falling. Ask your doctor what other things that you can do to help prevent falls. This information is not intended to replace advice given to you by your health care provider. Make sure you discuss any questions you have with your health care provider. Document Released: 01/29/2009 Document Revised: 09/10/2015 Document Reviewed: 05/09/2014 Elsevier Interactive Patient Education  2017 [employer].

## 2019-10-09 DIAGNOSIS — M1712 Unilateral primary osteoarthritis, left knee: Secondary | ICD-10-CM | POA: Diagnosis not present

## 2019-10-23 DIAGNOSIS — M4316 Spondylolisthesis, lumbar region: Secondary | ICD-10-CM | POA: Diagnosis not present

## 2019-10-23 DIAGNOSIS — M1611 Unilateral primary osteoarthritis, right hip: Secondary | ICD-10-CM | POA: Diagnosis not present

## 2019-10-23 DIAGNOSIS — M1712 Unilateral primary osteoarthritis, left knee: Secondary | ICD-10-CM | POA: Diagnosis not present

## 2019-10-24 ENCOUNTER — Other Ambulatory Visit: Payer: Self-pay | Admitting: Internal Medicine

## 2019-10-24 NOTE — Telephone Encounter (Signed)
Approved per protocol with exception of mammogram 30 day supply. Requested Prescriptions  Pending Prescriptions Disp Refills  . estradiol (ESTRACE) 0.5 MG tablet [Pharmacy Med Name: ESTRADIOL 0.5 MG TABLET] 30 tablet 11    Sig: TAKE 1 TABLET BY MOUTH ONCE DAILY.     OB/GYN:  Estrogens Failed - 10/24/2019  4:37 PM      Failed - Mammogram is up-to-date per Health Maintenance      Passed - Last BP in normal range    BP Readings from Last 1 Encounters:  09/24/19 122/74         Passed - Valid encounter within last 12 months    Recent Outpatient Visits          1 month ago Annual physical exam   [facility] Reubin Milan, MD   1 year ago Annual physical exam   [facility] Reubin Milan, MD   1 year ago Pain of left hip joint   [facility] Reubin Milan, MD   1 year ago Hammertoe of left foot   [facility] Reubin Milan, MD   2 years ago Medicare annual wellness visit, subsequent   [facility] Reubin Milan, MD      Future Appointments            In 4 months Judithann Graves Nyoka Cowden, MD [facility]

## 2019-10-30 DIAGNOSIS — M1611 Unilateral primary osteoarthritis, right hip: Secondary | ICD-10-CM | POA: Diagnosis not present

## 2019-11-20 DIAGNOSIS — M1611 Unilateral primary osteoarthritis, right hip: Secondary | ICD-10-CM | POA: Diagnosis not present

## 2019-11-20 DIAGNOSIS — M25562 Pain in left knee: Secondary | ICD-10-CM | POA: Diagnosis not present

## 2019-11-22 ENCOUNTER — Other Ambulatory Visit: Payer: Self-pay | Admitting: Internal Medicine

## 2019-12-03 DIAGNOSIS — F331 Major depressive disorder, recurrent, moderate: Secondary | ICD-10-CM | POA: Diagnosis not present

## 2019-12-18 DIAGNOSIS — H524 Presbyopia: Secondary | ICD-10-CM | POA: Diagnosis not present

## 2019-12-27 ENCOUNTER — Other Ambulatory Visit: Payer: Self-pay | Admitting: Internal Medicine

## 2020-03-04 ENCOUNTER — Other Ambulatory Visit: Payer: Self-pay

## 2020-03-04 ENCOUNTER — Ambulatory Visit (INDEPENDENT_AMBULATORY_CARE_PROVIDER_SITE_OTHER): Payer: Medicare Other | Admitting: Internal Medicine

## 2020-03-04 ENCOUNTER — Encounter: Payer: Self-pay | Admitting: Internal Medicine

## 2020-03-04 VITALS — BP 132/78 | HR 98 | Temp 98.3°F | Ht 61.0 in | Wt 137.0 lb

## 2020-03-04 DIAGNOSIS — M1712 Unilateral primary osteoarthritis, left knee: Secondary | ICD-10-CM

## 2020-03-04 DIAGNOSIS — E782 Mixed hyperlipidemia: Secondary | ICD-10-CM

## 2020-03-04 DIAGNOSIS — G250 Essential tremor: Secondary | ICD-10-CM | POA: Diagnosis not present

## 2020-03-04 MED ORDER — FENOFIBRATE 145 MG PO TABS: 145.0000 mg | ORAL_TABLET | Freq: Every day | ORAL | 1 refills | Status: DC

## 2020-03-04 MED ORDER — DESVENLAFAXINE SUCCINATE ER 50 MG PO TB24: 50.0000 mg | ORAL_TABLET | Freq: Every day | ORAL | 0 refills | Status: AC

## 2020-03-04 NOTE — Progress Notes (Signed)
Date:  03/04/2020   Name:  Jennifer Knapp   DOB:  01/28/1941   MRN:  [id number]   Chief Complaint: Hyperlipidemia (Follow up.), Osteoarthritis, and Tremors  Hyperlipidemia This is a chronic problem. The problem is uncontrolled (Triglycerides are very high). Pertinent negatives include no chest pain or shortness of breath. Current antihyperlipidemic treatment includes statins. The current treatment provides moderate improvement of lipids. Risk factors for coronary artery disease include dyslipidemia.  Knee Pain  Incident onset: seen by [facility]. There was no injury mechanism. The pain is present in the left knee. Treatments tried: steroid injections. The treatment provided significant relief.  Tremor - noted about 6 months ago.  Appeared to be benign and was not interfering with functioning.  It improves with movement and alcohol intake.  Psych also stopped Abilify and she feels that the tremor is better.   Lab Results  Component Value Date   CREATININE 1.03 (H) 09/24/2019   BUN 15 09/24/2019   NA 139 09/24/2019   K 4.6 09/24/2019   CL 102 09/24/2019   CO2 23 09/24/2019   Lab Results  Component Value Date   CHOL 199 09/24/2019   HDL 59 09/24/2019   LDLCALC 88 09/24/2019   TRIG 315 (H) 09/24/2019   CHOLHDL 3.4 09/24/2019   Lab Results  Component Value Date   TSH 4.580 (H) 09/24/2019   No results found for: HGBA1C Lab Results  Component Value Date   WBC 7.6 09/24/2019   HGB 13.8 09/24/2019   HCT 42.0 09/24/2019   MCV 94 09/24/2019   PLT 329 09/24/2019   Lab Results  Component Value Date   ALT 10 09/24/2019   AST 20 09/24/2019   ALKPHOS 65 09/24/2019   BILITOT 0.2 09/24/2019     Review of Systems  Constitutional: Positive for appetite change. Negative for fatigue, fever and unexpected weight change.  Respiratory: Negative for cough, chest tightness and shortness of breath.   Cardiovascular: Negative for chest pain and leg swelling.    Gastrointestinal: Positive for abdominal distention (belching some after eating or drinking) and constipation (hard stools every 3 days). Negative for abdominal pain.  Musculoskeletal: Positive for gait problem. Arthralgias: right hip and left knee.  Neurological: Positive for tremors (improved with stopping abilify). Negative for dizziness, light-headedness and headaches.  Psychiatric/Behavioral: Negative for sleep disturbance. The patient is not nervous/anxious.     Patient Active Problem List   Diagnosis Date Noted  . Benign essential tremor 09/24/2019  . Primary osteoarthritis of left knee 09/24/2019  . Pain of left hip joint 06/01/2018  . 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 Major depressive disorder with single episode, in partial remission (HCC) 05/31/2016  . Hyperlipidemia, mixed 10/22/2014  . B12 nutritional deficiency 10/22/2014  . Hot flash, menopausal 10/22/2014    No Known Allergies  Past Surgical History:  Procedure Laterality Date  . ABDOMINAL HYSTERECTOMY    . APPENDECTOMY    . BREAST BIOPSY Left    x2-neg  . CATARACT EXTRACTION W/PHACO Left 05/25/2015   Procedure: CATARACT EXTRACTION PHACO AND INTRAOCULAR LENS PLACEMENT (IOC);  Surgeon: Sallee Lange, MD;  Location: [facility];  Service: Ophthalmology;  Laterality: Left;  [place]: 01:25.9 AP%: 26.3 CDE: 37.92  Lot # [id number]  . CATARACT EXTRACTION W/PHACO Right 07/20/2015   Procedure: CATARACT EXTRACTION PHACO AND INTRAOCULAR LENS PLACEMENT (IOC);  Surgeon: Sallee Lange, MD;  Location: [facility];  Service: Ophthalmology;  Laterality: Right;  [place]      1:12.7 AP%  25.3 CDE    29.53 fluid casette lot # [id number] H  exp 09/15/2016    Social History   Tobacco Use  . Smoking status: Former Smoker    Packs/day: 0.50    Years: 25.00    Pack years: 12.50    Types: Cigarettes    Quit date: 1992    Years since quitting: 29.8  . Smokeless tobacco: Never Used  . Tobacco comment: quit early 90's  Vaping Use  . Vaping  Use: Never used  Substance Use Topics  . Alcohol use: Not Currently    Alcohol/week: 0.0 standard drinks  . Drug use: No     Medication list has been reviewed and updated.  Current Meds  Medication Sig  . ARIPiprazole (ABILIFY) 10 MG tablet Take 5 mg by mouth daily at 2 PM daily at 2 PM.  . diazepam (VALIUM) 5 MG tablet Take 1 tablet by mouth daily. AM  . diclofenac Sodium (VOLTAREN) 1 % GEL Voltaren Arthritis Pain 1 % topical gel  APPLY 2 GRAMS TO THE AFFECTED AREA(S) BY TOPICAL ROUTE 4 TIMES PER DAY  . estradiol (ESTRACE) 0.5 MG tablet TAKE 1 TABLET BY MOUTH ONCE DAILY.  . simvastatin (ZOCOR) 40 MG tablet TAKE 1 TABLET BY MOUTH ONCE DAILY FOR CHOLESTEROL    PHQ 2/9 Scores 03/04/2020 10/07/2019 09/24/2019 09/24/2018  PHQ - 2 Score 0 2 0 0  PHQ- 9 Score 0 7 0 -    GAD 7 : Generalized Anxiety Score 03/04/2020 09/24/2019  Nervous, Anxious, on Edge 0 0  Control/stop worrying 0 0  Worry too much - different things 0 0  Trouble relaxing 0 1  Restless 0 1  Easily annoyed or irritable 0 0  Afraid - awful might happen 0 0  Total GAD 7 Score 0 2  Anxiety Difficulty Not difficult at all Not difficult at all    BP Readings from Last 3 Encounters:  03/04/20 132/78  09/24/19 122/74  09/18/18 124/70    Physical Exam Vitals and nursing note reviewed.  Constitutional:      General: She is not in acute distress.    Appearance: She is well-developed.  HENT:     Head: Normocephalic and atraumatic.  Neck:     Vascular: No carotid bruit.  Cardiovascular:     Rate and Rhythm: Normal rate and regular rhythm.     Pulses: Normal pulses.  Pulmonary:     Effort: Pulmonary effort is normal. No respiratory distress.     Breath sounds: No wheezing or rhonchi.  Musculoskeletal:     Cervical back: Normal range of motion.     Right hip: No bony tenderness. Decreased range of motion.     Left knee: No effusion or erythema. Decreased range of motion.     Right lower leg: No edema.     Left lower  leg: No edema.  Lymphadenopathy:     Cervical: No cervical adenopathy.  Skin:    General: Skin is warm and dry.     Capillary Refill: Capillary refill takes less than 2 seconds.     Findings: No rash.  Neurological:     Mental Status: She is alert and oriented to person, place, and time.     Motor: Tremor (improves with movement) present.  Psychiatric:        Behavior: Behavior normal.        Thought Content: Thought content normal.     Wt Readings from Last 3 Encounters:  03/04/20 137 lb (62.1  kg)  09/24/19 143 lb (64.9 kg)  09/24/18 138 lb (62.6 kg)    BP 132/78   Pulse 98   Temp 98.3 F (36.8 C) (Oral)   Ht 5\' 1"  (1.549 m)   Wt 137 lb (62.1 kg)   SpO2 96%   BMI 25.89 kg/m   Assessment and Plan: 1. Hyperlipidemia, mixed Elevated Triglycerides despite zocor 40 mg Will add tricor 145 gm daily Recheck at next visit - fenofibrate (TRICOR) 145 MG tablet; Take 1 tablet (145 mg total) by mouth daily.  Dispense: 90 tablet; Refill: 1  2. Primary osteoarthritis of left knee Being followed by Ortho Had good response to hip and knee injections Recommend that she return every 4-6 months as needed  3. Benign essential tremor Improved but still present Continue to monitor and refer to Neurology if worsening   Partially dictated using Dragon software. Any errors are unintentional.  , MD [facility]  03/04/2020

## 2020-03-09 ENCOUNTER — Ambulatory Visit: Payer: Medicare Other | Admitting: Internal Medicine

## 2020-03-18 DIAGNOSIS — F331 Major depressive disorder, recurrent, moderate: Secondary | ICD-10-CM | POA: Diagnosis not present

## 2020-03-18 DIAGNOSIS — F419 Anxiety disorder, unspecified: Secondary | ICD-10-CM | POA: Diagnosis not present

## 2020-03-26 ENCOUNTER — Other Ambulatory Visit: Payer: Self-pay | Admitting: Internal Medicine

## 2020-03-26 NOTE — Telephone Encounter (Signed)
09/18/18 chart note indicates patient is no longer having mammograms. Approved 90 day supply.

## 2020-04-01 DIAGNOSIS — M1611 Unilateral primary osteoarthritis, right hip: Secondary | ICD-10-CM | POA: Diagnosis not present

## 2020-04-01 DIAGNOSIS — M25562 Pain in left knee: Secondary | ICD-10-CM | POA: Diagnosis not present

## 2020-05-10 DIAGNOSIS — I951 Orthostatic hypotension: Secondary | ICD-10-CM | POA: Diagnosis not present

## 2020-05-10 DIAGNOSIS — R070 Pain in throat: Secondary | ICD-10-CM | POA: Diagnosis not present

## 2020-05-10 DIAGNOSIS — R6889 Other general symptoms and signs: Secondary | ICD-10-CM | POA: Diagnosis not present

## 2020-05-10 DIAGNOSIS — R059 Cough, unspecified: Secondary | ICD-10-CM | POA: Diagnosis not present

## 2020-05-10 DIAGNOSIS — Z20822 Contact with and (suspected) exposure to covid-19: Secondary | ICD-10-CM | POA: Diagnosis not present

## 2020-05-12 ENCOUNTER — Telehealth: Payer: Self-pay

## 2020-05-12 NOTE — Telephone Encounter (Signed)
Copied from CRM [id number]. Topic: General - Inquiry >> May 12, 2020 12:05 PM Crist Infante wrote: Reason for CRM: pt has tested positive for covid.  Pt states it feels like it is all in her throat and head.  No fever.  Pt states she went to hospital Sunday and was dehydrated. She got fluids.  She had fainted 2 times prior to this. Please advise.

## 2020-05-13 NOTE — Telephone Encounter (Signed)
Called pt she stated she feels a little better. Told pt to go to UC if symptoms get worse. Also told pt to drink plenty of fluids, alternate ibuprofen and tylenol and rest. Pt verbalized understanding.  KP

## 2020-05-19 ENCOUNTER — Telehealth: Payer: Self-pay

## 2020-05-19 NOTE — Telephone Encounter (Signed)
Pt is covid positive. Told pt to go to UC. Pt verbalized understanding.  KP

## 2020-05-19 NOTE — Telephone Encounter (Signed)
Copied from CRM [id number]. Topic: General - Inquiry >> May 19, 2020 10:15 AM Daphine Deutscher D wrote: Reason for CRM: Pt called complaining of itching on her arms and neck.  She said it feels like under her skin  CB#  [phone]

## 2020-05-19 NOTE — Telephone Encounter (Signed)
Copied from CRM #[id number]. Topic: General - Inquiry >> May 19, 2020 10:15 AM Pettigrew, Teressa D wrote: Reason for CRM: Pt called complaining of itching on her arms and neck.  She said it feels like under her skin  CB#  [phone] 

## 2020-05-22 ENCOUNTER — Other Ambulatory Visit: Payer: Self-pay | Admitting: Internal Medicine

## 2020-05-22 DIAGNOSIS — E782 Mixed hyperlipidemia: Secondary | ICD-10-CM

## 2020-06-03 ENCOUNTER — Ambulatory Visit (INDEPENDENT_AMBULATORY_CARE_PROVIDER_SITE_OTHER): Payer: Medicare Other | Admitting: Internal Medicine

## 2020-06-03 ENCOUNTER — Encounter: Payer: Self-pay | Admitting: Internal Medicine

## 2020-06-03 ENCOUNTER — Other Ambulatory Visit: Payer: Self-pay

## 2020-06-03 VITALS — BP 118/64 | HR 97 | Temp 99.6°F | Ht 61.0 in | Wt 132.2 lb

## 2020-06-03 DIAGNOSIS — J40 Bronchitis, not specified as acute or chronic: Secondary | ICD-10-CM

## 2020-06-03 DIAGNOSIS — R21 Rash and other nonspecific skin eruption: Secondary | ICD-10-CM

## 2020-06-03 DIAGNOSIS — U071 COVID-19: Secondary | ICD-10-CM | POA: Diagnosis not present

## 2020-06-03 MED ORDER — AMOXICILLIN-POT CLAVULANATE 875-125 MG PO TABS: 1.0000 | ORAL_TABLET | Freq: Two times a day (BID) | ORAL | 0 refills | Status: DC

## 2020-06-03 MED ORDER — PREDNISONE 10 MG PO TABS: 10.0000 mg | ORAL_TABLET | ORAL | 0 refills | Status: AC

## 2020-06-03 NOTE — Progress Notes (Signed)
Date:  06/03/2020   Name:  Jennifer Knapp   DOB:  Feb 03, 1941   MRN:  [id number]   Chief Complaint: Rash (Rash on arms, neck, and ears for 4 weeks. Itching all over. Started on face. Been there since she had Covid 4 weeks ago.) and Cough (Green mucous. No shortness of breathe. Weakness with exertion. Coughing when laying down. Taking mucinex. )  Rash This is a new problem. The problem has been resolved since onset. The rash is diffuse. The rash is characterized by itchiness (red and itching - redness resolved but still very itchy). Associated with: started with Covid infection. Associated symptoms include coughing, a fever and shortness of breath. (Recent Covid infection)  Cough This is a new problem. The current episode started 1 to 4 weeks ago. The problem has been unchanged. The cough is productive of sputum. Associated symptoms include a fever, nasal congestion, a rash and shortness of breath. Pertinent negatives include no chills, weight loss or wheezing. The symptoms are aggravated by exercise. Recent Covid infection    Lab Results  Component Value Date   CREATININE 1.03 (H) 09/24/2019   BUN 15 09/24/2019   NA 139 09/24/2019   K 4.6 09/24/2019   CL 102 09/24/2019   CO2 23 09/24/2019   Lab Results  Component Value Date   CHOL 199 09/24/2019   HDL 59 09/24/2019   LDLCALC 88 09/24/2019   TRIG 315 (H) 09/24/2019   CHOLHDL 3.4 09/24/2019   Lab Results  Component Value Date   TSH 4.580 (H) 09/24/2019   No results found for: HGBA1C Lab Results  Component Value Date   WBC 7.6 09/24/2019   HGB 13.8 09/24/2019   HCT 42.0 09/24/2019   MCV 94 09/24/2019   PLT 329 09/24/2019   Lab Results  Component Value Date   ALT 10 09/24/2019   AST 20 09/24/2019   ALKPHOS 65 09/24/2019   BILITOT 0.2 09/24/2019     Review of Systems  Constitutional: Positive for fever. Negative for chills and weight loss.  Respiratory: Positive for cough and shortness of breath. Negative for  wheezing.   Skin: Positive for rash.    Patient Active Problem List   Diagnosis Date Noted  . Benign essential tremor 09/24/2019  . Primary osteoarthritis of left knee 09/24/2019  . Pain of left hip joint 06/01/2018  . 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 Major depressive disorder with single episode, in partial remission (HCC) 05/31/2016  . Hyperlipidemia, mixed 10/22/2014  . B12 nutritional deficiency 10/22/2014  . Hot flash, menopausal 10/22/2014    No Known Allergies  Past Surgical History:  Procedure Laterality Date  . ABDOMINAL HYSTERECTOMY    . APPENDECTOMY    . BREAST BIOPSY Left    x2-neg  . CATARACT EXTRACTION W/PHACO Left 05/25/2015   Procedure: CATARACT EXTRACTION PHACO AND INTRAOCULAR LENS PLACEMENT (IOC);  Surgeon: Sallee Lange, MD;  Location: [facility];  Service: Ophthalmology;  Laterality: Left;  [place]: 01:25.9 AP%: 26.3 CDE: 37.92  Lot # [id number]  . CATARACT EXTRACTION W/PHACO Right 07/20/2015   Procedure: CATARACT EXTRACTION PHACO AND INTRAOCULAR LENS PLACEMENT (IOC);  Surgeon: Sallee Lange, MD;  Location: [facility];  Service: Ophthalmology;  Laterality: Right;  [place]      1:12.7 AP%    25.3 CDE    29.53 fluid casette lot # [id number] H  exp 09/15/2016    Social History   Tobacco Use  . Smoking status: Former Smoker    Packs/day: 0.50  Years: 25.00    Pack years: 12.50    Types: Cigarettes    Quit date: 27    Years since quitting: 30.1  . Smokeless tobacco: Never Used  . Tobacco comment: quit early 90's  Vaping Use  . Vaping Use: Never used  Substance Use Topics  . Alcohol use: Not Currently    Alcohol/week: 0.0 standard drinks  . Drug use: No     Medication list has been reviewed and updated.  Current Meds  Medication Sig  . amoxicillin-clavulanate (AUGMENTIN) 875-125 MG tablet Take 1 tablet by mouth 2 (two) times daily for 10 days.  Marland Kitchen desvenlafaxine (PRISTIQ) 50 MG 24 hr tablet Take 1 tablet (50 mg total) by mouth daily.  . diazepam  (VALIUM) 5 MG tablet Take 1 tablet by mouth daily. AM  . diclofenac Sodium (VOLTAREN) 1 % GEL Voltaren Arthritis Pain 1 % topical gel  APPLY 2 GRAMS TO THE AFFECTED AREA(S) BY TOPICAL ROUTE 4 TIMES PER DAY  . estradiol (ESTRACE) 0.5 MG tablet TAKE 1 TABLET BY MOUTH ONCE DAILY.  . fenofibrate (TRICOR) 145 MG tablet TAKE 1 TABLET BY MOUTH ONCE DAILY.  Marland Kitchen predniSONE (DELTASONE) 10 MG tablet Take 1 tablet (10 mg total) by mouth as directed for 6 days. Take 6,5,4,3,2,1 then stop  . simvastatin (ZOCOR) 40 MG tablet TAKE 1 TABLET BY MOUTH ONCE DAILY FOR CHOLESTEROL    PHQ 2/9 Scores 06/03/2020 06/03/2020 03/04/2020 10/07/2019  PHQ - 2 Score 0 0 0 2  PHQ- 9 Score 0 0 0 7    GAD 7 : Generalized Anxiety Score 06/03/2020 06/03/2020 03/04/2020 09/24/2019  Nervous, Anxious, on Edge 0 0 0 0  Control/stop worrying 0 0 0 0  Worry too much - different things 0 0 0 0  Trouble relaxing 0 0 0 1  Restless 0 0 0 1  Easily annoyed or irritable 0 0 0 0  Afraid - awful might happen 0 0 0 0  Total GAD 7 Score 0 0 0 2  Anxiety Difficulty Not difficult at all Not difficult at all Not difficult at all Not difficult at all    BP Readings from Last 3 Encounters:  06/03/20 118/64  03/04/20 132/78  09/24/19 122/74    Physical Exam Vitals and nursing note reviewed.  Constitutional:      General: She is not in acute distress.    Appearance: Normal appearance. She is well-developed.  HENT:     Head: Normocephalic and atraumatic.     Nose: Nose normal.  Cardiovascular:     Rate and Rhythm: Normal rate and regular rhythm.     Pulses: Normal pulses.     Heart sounds: No murmur heard.   Pulmonary:     Effort: Pulmonary effort is normal. No respiratory distress.     Breath sounds: No wheezing or rhonchi.  Musculoskeletal:     Cervical back: Normal range of motion.     Right lower leg: No edema.     Left lower leg: No edema.  Lymphadenopathy:     Cervical: No cervical adenopathy.  Skin:    General: Skin is  warm and dry.     Findings: Bruising and erythema present. No rash.  Neurological:     General: No focal deficit present.     Mental Status: She is alert and oriented to person, place, and time.  Psychiatric:        Mood and Affect: Mood normal.        Behavior: Behavior  normal.     Wt Readings from Last 3 Encounters:  06/03/20 132 lb 3.2 oz (60 kg)  03/04/20 137 lb (62.1 kg)  09/24/19 143 lb (64.9 kg)    BP 118/64   Pulse 97   Temp 99.6 F (37.6 C) (Oral)   Ht 5\' 1"  (1.549 m)   Wt 132 lb 3.2 oz (60 kg)   SpO2 97%   BMI 24.98 kg/m   Assessment and Plan: 1. Bronchitis due to 2019-nCoV Continue mucinex and fluids Tylenol as needed for fever - amoxicillin-clavulanate (AUGMENTIN) 875-125 MG tablet; Take 1 tablet by mouth 2 (two) times daily for 10 days.  Dispense: 20 tablet; Refill: 0  2. Rash and nonspecific skin eruption S/p covid - will treat with steroid taper - predniSONE (DELTASONE) 10 MG tablet; Take 1 tablet (10 mg total) by mouth as directed for 6 days. Take 6,5,4,3,2,1 then stop  Dispense: 21 tablet; Refill: 0   Partially dictated using . Any errors are unintentional.  Animal nutritionist, MD [facility]  06/03/2020

## 2020-06-18 ENCOUNTER — Other Ambulatory Visit: Payer: Self-pay | Admitting: Internal Medicine

## 2020-06-18 ENCOUNTER — Ambulatory Visit: Payer: Self-pay | Admitting: *Deleted

## 2020-06-18 DIAGNOSIS — J40 Bronchitis, not specified as acute or chronic: Secondary | ICD-10-CM

## 2020-06-18 MED ORDER — AMOXICILLIN-POT CLAVULANATE 875-125 MG PO TABS: 1.0000 | ORAL_TABLET | Freq: Two times a day (BID) | ORAL | 0 refills | Status: AC

## 2020-06-18 NOTE — Telephone Encounter (Signed)
Attempted to call patient, line busy. Unable to leave message.

## 2020-06-18 NOTE — Telephone Encounter (Signed)
Please review.  KP

## 2020-06-18 NOTE — Telephone Encounter (Signed)
I returned her call.   She was seen in the office on 06/03/2020 for URI symptoms.   She was coughing up green mucus, coughing a lot.   This related to having had covid.  She took all the antibiotic and was better but yesterday the coughing became worse.   It's a dry hacking cough however this morning she coughed up some green mucus which is what happened the first go round.  No fever or shortness of breath.  She is requesting another rx for the amoxicillin.   That seems to do the best for me.   I asked if she needed cough medication and she said the cough is controlled with cough drops.   She is able to sleep at night when I asked her if the cough was worse at night.  It's not.  The rash she had when there has completely resolved.  She is unable to come into the office because there is no one to care for her husband.  He can't be left alone so she is requesting the antibiotic be called in.  I sent my notes to [facility] for Dr. Karn Cassis disposition.    Pt can be reached at [phone].    Reason for Disposition . Cough has been present for > 3 weeks  Answer Assessment - Initial Assessment Questions 1. ONSET: "When did the cough begin?"      Renew prescriptions that I was on.   I was seen on 06/03/2020.  Given antibiotic and I got better but starting yesterday the coughing is worse.  This morning I coughed up green mucus.   I took a Mucinex last night so it probably broke up some of the congestion. 2. SEVERITY: "How bad is the cough today?"      Just a hacking cough.   Not worse at night.   Able to sleep.  This morning was first time seeing the green mucus since she got better. 3. SPUTUM: "Describe the color of your sputum" (none, dry cough; clear, white, yellow, green)     Green 4. HEMOPTYSIS: "Are you coughing up any blood?" If so ask: "How much?" (flecks, streaks, tablespoons, etc.)     No 5. DIFFICULTY BREATHING: "Are you having difficulty breathing?" If Yes, ask: "How bad  is it?" (e.g., mild, moderate, severe)    - MILD: No SOB at rest, mild SOB with walking, speaks normally in sentences, can lay down, no retractions, pulse < 100.    - MODERATE: SOB at rest, SOB with minimal exertion and prefers to sit, cannot lie down flat, speaks in phrases, mild retractions, audible wheezing, pulse 100-120.    - SEVERE: Very SOB at rest, speaks in single words, struggling to breathe, sitting hunched forward, retractions, pulse > 120      No 6. FEVER: "Do you have a fever?" If Yes, ask: "What is your temperature, how was it measured, and when did it start?"     No 7. CARDIAC HISTORY: "Do you have any history of heart disease?" (e.g., heart attack, congestive heart failure)      Not asked 8. LUNG HISTORY: "Do you have any history of lung disease?"  (e.g., pulmonary embolus, asthma, emphysema)     Not asked 9. PE RISK FACTORS: "Do you have a history of blood clots?" (or: recent major surgery, recent prolonged travel, bedridden)     Not asked due to this being a relapse of illness she has been evaluated for. 10. OTHER SYMPTOMS: "Do  you have any other symptoms?" (e.g., runny nose, wheezing, chest pain)       No shortness of breath.  Just the coughing and this morning the green mucus. 11. PREGNANCY: "Is there any chance you are pregnant?" "When was your last menstrual period?"       N/A due to age 7. TRAVEL: "Have you traveled out of the country in the last month?" (e.g., travel history, exposures)       No    Stays home and takes care of her husband.  Protocols used: COUGH - ACUTE PRODUCTIVE-A-AH

## 2020-06-18 NOTE — Telephone Encounter (Signed)
Antibiotic sent in

## 2020-06-18 NOTE — Telephone Encounter (Signed)
Called (x2 times) pt could not leave VM phone was busy. Please try to call patient.   Will route result note to [facility] Nurse Triage for follow up when patient returns call to clinic. Nurse may give results to patient if they return call. CRM created for this message.   KP

## 2020-06-23 ENCOUNTER — Other Ambulatory Visit: Payer: Self-pay | Admitting: Internal Medicine

## 2020-06-23 NOTE — Telephone Encounter (Signed)
Requested medication (s) are due for refill today: Yes  Requested medication (s) are on the active medication list: Yes  Last refill:  05/30/19  Future visit scheduled: Yes  Notes to clinic:  Unable to refill per protocol, Rx expired.      Requested Prescriptions  Pending Prescriptions Disp Refills   simvastatin (ZOCOR) 40 MG tablet [Pharmacy Med Name: SIMVASTATIN 40 MG TABLET] 30 tablet 11    Sig: TAKE 1 TABLET BY MOUTH ONCE DAILY FOR CHOLESTEROL      Cardiovascular:  Antilipid - Statins Failed - 06/23/2020 11:36 AM      Failed - LDL in normal range and within 360 days    LDL Chol Calc (NIH)  Date Value Ref Range Status  09/24/2019 88 0 - 99 mg/dL Final          Failed - Triglycerides in normal range and within 360 days    Triglycerides  Date Value Ref Range Status  09/24/2019 315 (H) 0 - 149 mg/dL Final          Passed - Total Cholesterol in normal range and within 360 days    Cholesterol, Total  Date Value Ref Range Status  09/24/2019 199 100 - 199 mg/dL Final          Passed - HDL in normal range and within 360 days    HDL  Date Value Ref Range Status  09/24/2019 59 >39 mg/dL Final          Passed - Patient is not pregnant      Passed - Valid encounter within last 12 months    Recent Outpatient Visits           2 weeks ago Bronchitis due to 2019-nCoV   [facility] Reubin Milan, MD   3 months ago Hyperlipidemia, mixed   [facility] Reubin Milan, MD   9 months ago Annual physical exam   [facility] Reubin Milan, MD   1 year ago Annual physical exam   [facility] Reubin Milan, MD   2 years ago Pain of left hip joint   [facility] Reubin Milan, MD       Future Appointments             In 3 months Judithann Graves Nyoka Cowden, MD [facility]              Signed Prescriptions Disp Refills   estradiol (ESTRACE) 0.5 MG tablet 90 tablet 0    Sig: TAKE 1 TABLET BY  MOUTH ONCE DAILY.      OB/GYN:  Estrogens Failed - 06/23/2020 11:36 AM      Failed - Mammogram is up-to-date per Health Maintenance      Passed - Last BP in normal range    BP Readings from Last 1 Encounters:  06/03/20 118/64          Passed - Valid encounter within last 12 months    Recent Outpatient Visits           2 weeks ago Bronchitis due to 2019-nCoV   [facility] Reubin Milan, MD   3 months ago Hyperlipidemia, mixed   [facility] Reubin Milan, MD   9 months ago Annual physical exam   [facility] Reubin Milan, MD   1 year ago Annual physical exam   [facility] Reubin Milan, MD   2 years ago  Pain of left hip joint   [facility] Reubin Milan, MD       Future Appointments             In 3 months Judithann Graves Nyoka Cowden, MD [facility]

## 2020-07-01 DIAGNOSIS — M1611 Unilateral primary osteoarthritis, right hip: Secondary | ICD-10-CM | POA: Diagnosis not present

## 2020-07-08 ENCOUNTER — Telehealth: Payer: Self-pay

## 2020-07-08 NOTE — Telephone Encounter (Signed)
Pt put on schedule for tomorrow at 1140- Dr Judithann Graves wants to see her

## 2020-07-08 NOTE — Telephone Encounter (Unsigned)
Copied from CRM [id number]. Topic: General - Other >> Jul 08, 2020  9:46 AM Jennifer Knapp wrote: Reason for CRM: Pt stated she is still coughing up green phlegm so she would like to report that to Dr. Judithann Graves and her nurse.

## 2020-07-09 ENCOUNTER — Ambulatory Visit (INDEPENDENT_AMBULATORY_CARE_PROVIDER_SITE_OTHER): Payer: Medicare Other | Admitting: Internal Medicine

## 2020-07-09 ENCOUNTER — Other Ambulatory Visit: Payer: Self-pay

## 2020-07-09 ENCOUNTER — Encounter: Payer: Self-pay | Admitting: Internal Medicine

## 2020-07-09 VITALS — BP 122/78 | HR 101 | Temp 98.2°F | Ht 61.0 in | Wt 129.0 lb

## 2020-07-09 DIAGNOSIS — F324 Major depressive disorder, single episode, in partial remission: Secondary | ICD-10-CM

## 2020-07-09 DIAGNOSIS — J329 Chronic sinusitis, unspecified: Secondary | ICD-10-CM | POA: Diagnosis not present

## 2020-07-09 DIAGNOSIS — R058 Other specified cough: Secondary | ICD-10-CM | POA: Diagnosis not present

## 2020-07-09 MED ORDER — ALBUTEROL SULFATE HFA 108 (90 BASE) MCG/ACT IN AERS: 2.0000 | INHALATION_SPRAY | Freq: Four times a day (QID) | RESPIRATORY_TRACT | 0 refills | Status: DC | PRN

## 2020-07-09 NOTE — Progress Notes (Signed)
Date:  07/09/2020   Name:  Jennifer Knapp   DOB:  24-Sep-1940   MRN:  [id number]   Chief Complaint: Cough (X1 week, Green production, clears up for about a week then comes back, only happens in the morning once )  Sinus Problem This is a recurrent problem. The problem is unchanged. There has been no fever. She is experiencing no pain. Associated symptoms include congestion and coughing. Pertinent negatives include no chills, ear pain, headaches, shortness of breath, sinus pressure or swollen glands. (Thick mucus in the morning only) Past treatments include nothing.    Lab Results  Component Value Date   CREATININE 1.03 (H) 09/24/2019   BUN 15 09/24/2019   NA 139 09/24/2019   K 4.6 09/24/2019   CL 102 09/24/2019   CO2 23 09/24/2019   Lab Results  Component Value Date   CHOL 199 09/24/2019   HDL 59 09/24/2019   LDLCALC 88 09/24/2019   TRIG 315 (H) 09/24/2019   CHOLHDL 3.4 09/24/2019   Lab Results  Component Value Date   TSH 4.580 (H) 09/24/2019   No results found for: HGBA1C Lab Results  Component Value Date   WBC 7.6 09/24/2019   HGB 13.8 09/24/2019   HCT 42.0 09/24/2019   MCV 94 09/24/2019   PLT 329 09/24/2019   Lab Results  Component Value Date   ALT 10 09/24/2019   AST 20 09/24/2019   ALKPHOS 65 09/24/2019   BILITOT 0.2 09/24/2019     Review of Systems  Constitutional: Negative for chills, fatigue and fever.  HENT: Positive for congestion and postnasal drip. Negative for ear pain, sinus pressure and trouble swallowing.   Respiratory: Positive for cough. Negative for chest tightness, shortness of breath and wheezing.   Cardiovascular: Negative for chest pain and palpitations.  Allergic/Immunologic: Positive for environmental allergies.  Neurological: Negative for dizziness, light-headedness and headaches.    Patient Active Problem List   Diagnosis Date Noted  . Benign essential tremor 09/24/2019  . Primary osteoarthritis of left knee 09/24/2019  .  Pain of left hip joint 06/01/2018  . 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 Major depressive disorder with single episode, in partial remission (HCC) 05/31/2016  . Hyperlipidemia, mixed 10/22/2014  . B12 nutritional deficiency 10/22/2014  . Hot flash, menopausal 10/22/2014    No Known Allergies  Past Surgical History:  Procedure Laterality Date  . ABDOMINAL HYSTERECTOMY    . APPENDECTOMY    . BREAST BIOPSY Left    x2-neg  . CATARACT EXTRACTION W/PHACO Left 05/25/2015   Procedure: CATARACT EXTRACTION PHACO AND INTRAOCULAR LENS PLACEMENT (IOC);  Surgeon: Sallee Lange, MD;  Location: [facility] ORS;  Service: Ophthalmology;  Laterality: Left;  [place]: 01:25.9 AP%: 26.3 CDE: 37.92  Lot # [id number]  . CATARACT EXTRACTION W/PHACO Right 07/20/2015   Procedure: CATARACT EXTRACTION PHACO AND INTRAOCULAR LENS PLACEMENT (IOC);  Surgeon: Sallee Lange, MD;  Location: [facility] ORS;  Service: Ophthalmology;  Laterality: Right;  [place]      1:12.7 AP%    25.3 CDE    29.53 fluid casette lot # [id number]  exp 09/15/2016    Social History   Tobacco Use  . Smoking status: Former Smoker    Packs/day: 0.50    Years: 25.00    Pack years: 12.50    Types: Cigarettes    Quit date: 1992    Years since quitting: 30.2  . Smokeless tobacco: Never Used  . Tobacco comment: quit early 90's  Vaping Use  .  Vaping Use: Never used  Substance Use Topics  . Alcohol use: Not Currently    Alcohol/week: 0.0 standard drinks  . Drug use: No     Medication list has been reviewed and updated.  Current Meds  Medication Sig  . desvenlafaxine (PRISTIQ) 50 MG 24 hr tablet Take 1 tablet (50 mg total) by mouth daily.  . diazepam (VALIUM) 10 MG tablet Take 10 mg by mouth daily as needed. Half a tablet  . estradiol (ESTRACE) 0.5 MG tablet TAKE 1 TABLET BY MOUTH ONCE DAILY.  . fenofibrate (TRICOR) 145 MG tablet TAKE 1 TABLET BY MOUTH ONCE DAILY. (Patient taking differently: No sig reported)  . simvastatin (ZOCOR) 40 MG tablet TAKE 1  TABLET BY MOUTH ONCE DAILY FOR CHOLESTEROL    PHQ 2/9 Scores 07/09/2020 06/03/2020 06/03/2020 03/04/2020  PHQ - 2 Score 0 0 0 0  PHQ- 9 Score 0 0 0 0    GAD 7 : Generalized Anxiety Score 07/09/2020 06/03/2020 06/03/2020 03/04/2020  Nervous, Anxious, on Edge 0 0 0 0  Control/stop worrying 0 0 0 0  Worry too much - different things 0 0 0 0  Trouble relaxing 0 0 0 0  Restless 0 0 0 0  Easily annoyed or irritable 0 0 0 0  Afraid - awful might happen 0 0 0 0  Total GAD 7 Score 0 0 0 0  Anxiety Difficulty - Not difficult at all Not difficult at all Not difficult at all    BP Readings from Last 3 Encounters:  07/09/20 122/78  06/03/20 118/64  03/04/20 132/78    Physical Exam Vitals and nursing note reviewed.  Constitutional:      General: She is not in acute distress.    Appearance: She is well-developed.  HENT:     Head: Normocephalic and atraumatic.     Right Ear: Tympanic membrane and ear canal normal.     Left Ear: Tympanic membrane and ear canal normal.     Nose:     Right Sinus: No maxillary sinus tenderness or frontal sinus tenderness.     Left Sinus: No maxillary sinus tenderness or frontal sinus tenderness.     Mouth/Throat:     Pharynx: Oropharynx is clear. No oropharyngeal exudate, posterior oropharyngeal erythema or uvula swelling.  Pulmonary:     Effort: Pulmonary effort is normal. No respiratory distress.  Skin:    General: Skin is warm and dry.     Findings: No rash.  Neurological:     Mental Status: She is alert and oriented to person, place, and time.  Psychiatric:        Mood and Affect: Mood normal.        Behavior: Behavior normal.     Wt Readings from Last 3 Encounters:  07/09/20 129 lb (58.5 kg)  06/03/20 132 lb 3.2 oz (60 kg)  03/04/20 137 lb (62.1 kg)    BP 122/78   Pulse (!) 101   Temp 98.2 F (36.8 C) (Oral)   Ht 5\' 1"  (1.549 m)   Wt 129 lb (58.5 kg)   SpO2 97%   BMI 24.37 kg/m   Assessment and Plan: 1. Chronic congestion of paranasal  sinus No evidence of infection; suspect sx are related to post nasal drainage that is aggravated by supine position Begin Allegra or Claritin daily  2. Allergic cough Triggering some mild wheezing per patient with hx of asthma in the past Recommend albuterol MDI as needed - albuterol (VENTOLIN HFA) 108 (90  Base) MCG/ACT inhaler; Inhale 2 puffs into the lungs every 6 (six) hours as needed for wheezing or shortness of breath.  Dispense: 1 each; Refill: 0  3. Major depressive disorder with single episode, in partial remission (HCC) Clinically stable on current regimen with good control of symptoms, No SI or HI. Will continue current therapy and psych follow up.   Partially dictated using Animal nutritionist. Any errors are unintentional.  Bari Edward, MD [facility]  07/09/2020

## 2020-07-09 NOTE — Patient Instructions (Signed)
Loratidine (claritin) 10 mg  Or  Fexofenadine (allegra) 180 mg

## 2020-07-20 ENCOUNTER — Other Ambulatory Visit: Payer: Self-pay | Admitting: Internal Medicine

## 2020-07-20 NOTE — Telephone Encounter (Signed)
Requested Prescriptions  Pending Prescriptions Disp Refills  . estradiol (ESTRACE) 0.5 MG tablet [Pharmacy Med Name: ESTRADIOL 0.5 MG TABLET] 90 tablet 0    Sig: TAKE 1 TABLET BY MOUTH ONCE DAILY.     OB/GYN:  Estrogens Failed - 07/20/2020  5:28 PM      Failed - Mammogram is up-to-date per Health Maintenance      Passed - Last BP in normal range    BP Readings from Last 1 Encounters:  07/09/20 122/78         Passed - Valid encounter within last 12 months    Recent Outpatient Visits          1 week ago Chronic congestion of paranasal sinus   [facility] Reubin Milan, MD   1 month ago Bronchitis due to 2019-nCoV   [facility] Reubin Milan, MD   4 months ago Hyperlipidemia, mixed   [facility] Reubin Milan, MD   10 months ago Annual physical exam   [facility] Reubin Milan, MD   1 year ago Annual physical exam   [facility] Reubin Milan, MD      Future Appointments            In 2 months Judithann Graves Nyoka Cowden, MD [facility]

## 2020-08-26 DIAGNOSIS — M25551 Pain in right hip: Secondary | ICD-10-CM | POA: Diagnosis not present

## 2020-08-26 DIAGNOSIS — G8929 Other chronic pain: Secondary | ICD-10-CM | POA: Diagnosis not present

## 2020-08-26 DIAGNOSIS — M1611 Unilateral primary osteoarthritis, right hip: Secondary | ICD-10-CM | POA: Diagnosis not present

## 2020-09-02 DIAGNOSIS — F331 Major depressive disorder, recurrent, moderate: Secondary | ICD-10-CM | POA: Diagnosis not present

## 2020-09-02 DIAGNOSIS — Z79899 Other long term (current) drug therapy: Secondary | ICD-10-CM | POA: Diagnosis not present

## 2020-09-02 DIAGNOSIS — F419 Anxiety disorder, unspecified: Secondary | ICD-10-CM | POA: Diagnosis not present

## 2020-09-30 ENCOUNTER — Ambulatory Visit (INDEPENDENT_AMBULATORY_CARE_PROVIDER_SITE_OTHER): Payer: Medicare Other | Admitting: Internal Medicine

## 2020-09-30 ENCOUNTER — Other Ambulatory Visit: Payer: Self-pay

## 2020-09-30 ENCOUNTER — Encounter: Payer: Self-pay | Admitting: Internal Medicine

## 2020-09-30 VITALS — BP 122/72 | HR 91 | Temp 98.2°F | Ht 61.0 in | Wt 130.0 lb

## 2020-09-30 DIAGNOSIS — F324 Major depressive disorder, single episode, in partial remission: Secondary | ICD-10-CM

## 2020-09-30 DIAGNOSIS — Z0000 Encounter for general adult medical examination without abnormal findings: Secondary | ICD-10-CM

## 2020-09-30 DIAGNOSIS — Z1231 Encounter for screening mammogram for malignant neoplasm of breast: Secondary | ICD-10-CM | POA: Diagnosis not present

## 2020-09-30 DIAGNOSIS — E782 Mixed hyperlipidemia: Secondary | ICD-10-CM

## 2020-09-30 DIAGNOSIS — Z791 Long term (current) use of non-steroidal anti-inflammatories (NSAID): Secondary | ICD-10-CM

## 2020-09-30 DIAGNOSIS — M1611 Unilateral primary osteoarthritis, right hip: Secondary | ICD-10-CM

## 2020-09-30 MED ORDER — SIMVASTATIN 40 MG PO TABS: 40.0000 mg | ORAL_TABLET | Freq: Every day | ORAL | 3 refills | Status: DC

## 2020-09-30 NOTE — Progress Notes (Signed)
Date:  09/30/2020   Name:  Jennifer Knapp   DOB:  1940-11-25   MRN:  [id number]   Chief Complaint: Annual Exam (Breast exam no pap) Jennifer Knapp is a 80 y.o. female who presents today for her Complete Annual Exam. She feels well. She reports exercising none. She reports she is sleeping well. Breast complaints none.  Mammogram: 12/2015 DEXA: none Pap smear: discontinued Colonoscopy: 2012  Immunization History  Administered Date(s) Administered   Fluad Quad(high Dose 65+) 01/29/2020   Influenza,inj,Quad PF,6+ Mos 12/23/2016   Influenza,inj,quad, With Preservative 02/06/2019   Influenza-Unspecified 02/05/2018, 01/17/2020   Moderna Sars-Covid-2 Vaccination 05/03/2019, 05/31/2019, 02/26/2020   Pneumococcal Conjugate-13 05/31/2016   Pneumococcal-Unspecified 04/18/2004   Tdap 01/14/2011    Hyperlipidemia This is a chronic problem. The problem is controlled. Pertinent negatives include no chest pain or shortness of breath. Current antihyperlipidemic treatment includes statins and fibric acid derivatives. The current treatment provides moderate improvement of lipids.  Depression        This is a chronic problem.The problem is unchanged (followed by Psych).  Associated symptoms include no fatigue and no headaches.  Past treatments include SNRIs - Serotonin and norepinephrine reuptake inhibitors.  Lab Results  Component Value Date   CREATININE 1.03 (H) 09/24/2019   BUN 15 09/24/2019   NA 139 09/24/2019   K 4.6 09/24/2019   CL 102 09/24/2019   CO2 23 09/24/2019   Lab Results  Component Value Date   CHOL 199 09/24/2019   HDL 59 09/24/2019   LDLCALC 88 09/24/2019   TRIG 315 (H) 09/24/2019   CHOLHDL 3.4 09/24/2019   Lab Results  Component Value Date   TSH 4.580 (H) 09/24/2019   No results found for: HGBA1C Lab Results  Component Value Date   WBC 7.6 09/24/2019   HGB 13.8 09/24/2019   HCT 42.0 09/24/2019   MCV 94 09/24/2019   PLT 329 09/24/2019   Lab Results   Component Value Date   ALT 10 09/24/2019   AST 20 09/24/2019   ALKPHOS 65 09/24/2019   BILITOT 0.2 09/24/2019     Review of Systems  Constitutional:  Negative for chills, fatigue and fever.  HENT:  Negative for congestion, hearing loss, tinnitus, trouble swallowing and voice change.   Eyes:  Negative for visual disturbance.  Respiratory:  Negative for cough, chest tightness, shortness of breath and wheezing.   Cardiovascular:  Negative for chest pain, palpitations and leg swelling.  Gastrointestinal:  Negative for abdominal pain, constipation, diarrhea and vomiting.  Endocrine: Negative for polydipsia and polyuria.  Genitourinary:  Negative for dysuria, frequency, genital sores, vaginal bleeding and vaginal discharge.  Musculoskeletal:  Positive for arthralgias (planning THA). Negative for gait problem and joint swelling.  Skin:  Negative for color change and rash.  Neurological:  Negative for dizziness, tremors, light-headedness and headaches.  Hematological:  Negative for adenopathy. Does not bruise/bleed easily.  Psychiatric/Behavioral:  Positive for depression. Negative for dysphoric mood and sleep disturbance. The patient is not nervous/anxious.    Patient Active Problem List   Diagnosis Date Noted   Primary osteoarthritis of right hip 08/26/2020   Benign essential tremor 09/24/2019   Primary osteoarthritis of left knee 09/24/2019   Pain of left hip joint 06/01/2018   Neuropathy of foot 12/23/2016   Major depressive disorder with single episode, in partial remission (HCC) 05/31/2016   Hyperlipidemia, mixed 10/22/2014   B12 nutritional deficiency 10/22/2014   Hot flash, menopausal 10/22/2014    No Known Allergies  Past Surgical History:  Procedure Laterality Date   ABDOMINAL HYSTERECTOMY     APPENDECTOMY     BREAST BIOPSY Left    x2-neg   CATARACT EXTRACTION W/PHACO Left 05/25/2015   Procedure: CATARACT EXTRACTION PHACO AND INTRAOCULAR LENS PLACEMENT (IOC);  Surgeon:  Sallee Lange, MD;  Location: [facility] ORS;  Service: Ophthalmology;  Laterality: Left;  [place]: 01:25.9 AP%: 26.3 CDE: 37.92  Lot # [id number]   CATARACT EXTRACTION W/PHACO Right 07/20/2015   Procedure: CATARACT EXTRACTION PHACO AND INTRAOCULAR LENS PLACEMENT (IOC);  Surgeon: Sallee Lange, MD;  Location: [facility] ORS;  Service: Ophthalmology;  Laterality: Right;  [place]      1:12.7 AP%    25.3 CDE    29.53 fluid casette lot # [id number]  exp 09/15/2016    Social History   Tobacco Use   Smoking status: Former    Packs/day: 0.50    Years: 25.00    Pack years: 12.50    Types: Cigarettes    Quit date: 1992    Years since quitting: 30.4   Smokeless tobacco: Never   Tobacco comments:    quit early 90's  Vaping Use   Vaping Use: Never used  Substance Use Topics   Alcohol use: Not Currently    Alcohol/week: 0.0 standard drinks   Drug use: No     Medication list has been reviewed and updated.  Current Meds  Medication Sig   Acetaminophen (TYLENOL EXTRA STRENGTH PO) Take by mouth daily. Takes 2-4 a day   desvenlafaxine (PRISTIQ) 50 MG 24 hr tablet Take 1 tablet (50 mg total) by mouth daily.   diazepam (VALIUM) 10 MG tablet Take 10 mg by mouth daily as needed. Half a tablet   estradiol (ESTRACE) 0.5 MG tablet TAKE 1 TABLET BY MOUTH ONCE DAILY.   fenofibrate (TRICOR) 145 MG tablet TAKE 1 TABLET BY MOUTH ONCE DAILY. (Patient taking differently: No sig reported)   meloxicam (MOBIC) 15 MG tablet Take 1 tablet by mouth daily.   simvastatin (ZOCOR) 40 MG tablet TAKE 1 TABLET BY MOUTH ONCE DAILY FOR CHOLESTEROL    PHQ 2/9 Scores 09/30/2020 07/09/2020 06/03/2020 06/03/2020  PHQ - 2 Score 1 0 0 0  PHQ- 9 Score 2 0 0 0    GAD 7 : Generalized Anxiety Score 09/30/2020 07/09/2020 06/03/2020 06/03/2020  Nervous, Anxious, on Edge 0 0 0 0  Control/stop worrying 0 0 0 0  Worry too much - different things 0 0 0 0  Trouble relaxing 0 0 0 0  Restless 0 0 0 0  Easily annoyed or irritable 1 0 0 0  Afraid -  awful might happen 0 0 0 0  Total GAD 7 Score 1 0 0 0  Anxiety Difficulty - - Not difficult at all Not difficult at all    BP Readings from Last 3 Encounters:  09/30/20 122/72  07/09/20 122/78  06/03/20 118/64    Physical Exam Vitals and nursing note reviewed.  Constitutional:      General: She is not in acute distress.    Appearance: She is well-developed.  HENT:     Head: Normocephalic and atraumatic.     Right Ear: Tympanic membrane and ear canal normal.     Left Ear: Tympanic membrane and ear canal normal.     Nose:     Right Sinus: No maxillary sinus tenderness.     Left Sinus: No maxillary sinus tenderness.  Eyes:     General: No scleral icterus.  Right eye: No discharge.        Left eye: No discharge.     Conjunctiva/sclera: Conjunctivae normal.  Neck:     Thyroid: No thyromegaly.     Vascular: No carotid bruit.  Cardiovascular:     Rate and Rhythm: Normal rate and regular rhythm.     Pulses: Normal pulses.     Heart sounds: Normal heart sounds.  Pulmonary:     Effort: Pulmonary effort is normal. No respiratory distress.     Breath sounds: No wheezing.  Chest:  Breasts:    Right: No mass, nipple discharge, skin change or tenderness.     Left: No mass, nipple discharge, skin change or tenderness.  Abdominal:     General: Bowel sounds are normal.     Palpations: Abdomen is soft.     Tenderness: There is no abdominal tenderness.  Musculoskeletal:     Cervical back: Normal range of motion. No erythema.     Right hip: Decreased range of motion.     Right lower leg: No edema.     Left lower leg: No edema.  Lymphadenopathy:     Cervical: No cervical adenopathy.  Skin:    General: Skin is warm and dry.     Findings: No rash.  Neurological:     Mental Status: She is alert and oriented to person, place, and time.     Cranial Nerves: No cranial nerve deficit.     Sensory: No sensory deficit.     Deep Tendon Reflexes: Reflexes are normal and symmetric.   Psychiatric:        Attention and Perception: Attention normal.        Mood and Affect: Mood normal.    Wt Readings from Last 3 Encounters:  09/30/20 130 lb (59 kg)  07/09/20 129 lb (58.5 kg)  06/03/20 132 lb 3.2 oz (60 kg)    BP 122/72   Pulse 91   Temp 98.2 F (36.8 C) (Oral)   Ht 5\' 1"  (1.549 m)   Wt 130 lb (59 kg)   SpO2 97%   BMI 24.56 kg/m   Assessment and Plan: 1. Annual physical exam Continue healthy diet Exercise as able She declines mammograms  2. Encounter for screening mammogram for breast cancer Pt declines mammograms  3. Hyperlipidemia, mixed Tolerating statin medication without side effects at this time LDL is at goal of < 70 on current dose Continue same therapy without change at this time.  4. Major depressive disorder with single episode, in partial remission (HCC) Clinically stable on current regimen with good control of symptoms, No SI or HI. Will continue current therapy.  5. Primary osteoarthritis of right hip Surgery planned - worried about caring for her husband now and especially after surgery Discussed making a referral for community resources for him.  6. NSAID long-term use Hopefully can discontinue nsaids in the near future  Lab Orders  CBC with Differential/Platelet  Comprehensive metabolic panel  Lipid panel  TSH    Partially dictated using Dragon software. Any errors are unintentional.  , MD [facility]  09/30/2020

## 2020-10-01 LAB — COMPREHENSIVE METABOLIC PANEL
ALT: 20 IU/L (ref 0–32)
AST: 31 IU/L (ref 0–40)
Albumin/Globulin Ratio: 2 (ref 1.2–2.2)
Albumin: 4.7 g/dL — ABNORMAL HIGH (ref 3.6–4.6)
Alkaline Phosphatase: 51 IU/L (ref 44–121)
BUN/Creatinine Ratio: 15 (ref 12–28)
BUN: 19 mg/dL (ref 8–27)
Bilirubin Total: 0.3 mg/dL (ref 0.0–1.2)
CO2: 21 mmol/L (ref 20–29)
Calcium: 10.1 mg/dL (ref 8.7–10.3)
Chloride: 103 mmol/L (ref 96–106)
Creatinine, Ser: 1.24 mg/dL — ABNORMAL HIGH (ref 0.57–1.00)
Globulin, Total: 2.4 g/dL (ref 1.5–4.5)
Glucose: 91 mg/dL (ref 65–99)
Potassium: 4.8 mmol/L (ref 3.5–5.2)
Sodium: 139 mmol/L (ref 134–144)
Total Protein: 7.1 g/dL (ref 6.0–8.5)
eGFR: 44 mL/min/{1.73_m2} — ABNORMAL LOW (ref >59)

## 2020-10-01 LAB — CBC WITH DIFFERENTIAL/PLATELET
Basophils Absolute: 0.1 10*3/uL (ref 0.0–0.2)
Basos: 1 %
EOS (ABSOLUTE): 0.2 10*3/uL (ref 0.0–0.4)
Eos: 2 %
Hematocrit: 40.3 % (ref 34.0–46.6)
Hemoglobin: 13.5 g/dL (ref 11.1–15.9)
Immature Grans (Abs): 0 10*3/uL (ref 0.0–0.1)
Immature Granulocytes: 0 %
Lymphocytes Absolute: 3.4 10*3/uL — ABNORMAL HIGH (ref 0.7–3.1)
Lymphs: 40 %
MCH: 31.2 pg (ref 26.6–33.0)
MCHC: 33.5 g/dL (ref 31.5–35.7)
MCV: 93 fL (ref 79–97)
Monocytes Absolute: 0.6 10*3/uL (ref 0.1–0.9)
Monocytes: 6 %
Neutrophils Absolute: 4.4 10*3/uL (ref 1.4–7.0)
Neutrophils: 51 %
Platelets: 384 10*3/uL (ref 150–450)
RBC: 4.33 x10E6/uL (ref 3.77–5.28)
RDW: 12.5 % (ref 11.7–15.4)
WBC: 8.6 10*3/uL (ref 3.4–10.8)

## 2020-10-01 LAB — LIPID PANEL
Chol/HDL Ratio: 2.7 ratio (ref 0.0–4.4)
Cholesterol, Total: 180 mg/dL (ref 100–199)
HDL: 67 mg/dL (ref >39)
LDL Chol Calc (NIH): 92 mg/dL (ref 0–99)
Triglycerides: 120 mg/dL (ref 0–149)
VLDL Cholesterol Cal: 21 mg/dL (ref 5–40)

## 2020-10-01 LAB — TSH: TSH: 5.21 u[IU]/mL — ABNORMAL HIGH (ref 0.450–4.500)

## 2020-10-07 DIAGNOSIS — F419 Anxiety disorder, unspecified: Secondary | ICD-10-CM | POA: Diagnosis not present

## 2020-10-07 DIAGNOSIS — K219 Gastro-esophageal reflux disease without esophagitis: Secondary | ICD-10-CM | POA: Diagnosis not present

## 2020-10-07 DIAGNOSIS — M1611 Unilateral primary osteoarthritis, right hip: Secondary | ICD-10-CM | POA: Diagnosis not present

## 2020-10-07 DIAGNOSIS — R42 Dizziness and giddiness: Secondary | ICD-10-CM | POA: Diagnosis not present

## 2020-10-07 DIAGNOSIS — Z79899 Other long term (current) drug therapy: Secondary | ICD-10-CM | POA: Diagnosis not present

## 2020-10-07 DIAGNOSIS — F32A Depression, unspecified: Secondary | ICD-10-CM | POA: Diagnosis not present

## 2020-10-07 DIAGNOSIS — Z01818 Encounter for other preprocedural examination: Secondary | ICD-10-CM | POA: Diagnosis not present

## 2020-10-07 DIAGNOSIS — R7989 Other specified abnormal findings of blood chemistry: Secondary | ICD-10-CM | POA: Diagnosis not present

## 2020-10-07 DIAGNOSIS — E782 Mixed hyperlipidemia: Secondary | ICD-10-CM | POA: Diagnosis not present

## 2020-10-12 ENCOUNTER — Ambulatory Visit (INDEPENDENT_AMBULATORY_CARE_PROVIDER_SITE_OTHER): Payer: Medicare Other

## 2020-10-12 DIAGNOSIS — Z0000 Encounter for general adult medical examination without abnormal findings: Secondary | ICD-10-CM

## 2020-10-12 NOTE — Progress Notes (Signed)
Subjective:   Jennifer Knapp is a 80 y.o. female who presents for Medicare Annual (Subsequent) preventive examination.  Virtual Visit via Telephone Note  I connected with  Jennifer Knapp on 10/12/20 at  1:20 PM EDT by telephone and verified that I am speaking with the correct person using two identifiers.  Location: Patient: home Provider: [facility] Persons participating in the virtual visit: patient/Nurse Health Advisor   I discussed the limitations, risks, security and privacy concerns of performing an evaluation and management service by telephone and the availability of in person appointments. The patient expressed understanding and agreed to proceed.  Interactive audio and video telecommunications were attempted between this nurse and patient, however failed, due to patient having technical difficulties OR patient did not have access to video capability.  We continued and completed visit with audio only.  Some vital signs may be absent or patient reported.   Reather Littler, LPN   Review of Systems     Cardiac Risk Factors include: advanced age (>69men, >55 women);dyslipidemia     Objective:    There were no vitals filed for this visit. There is no height or weight on file to calculate BMI.  Advanced Directives 10/12/2020 10/07/2019 09/24/2018 05/31/2016 07/20/2015 06/10/2015 05/25/2015  Does Patient Have a Medical Advance Directive? Yes No No No No Yes Yes  Type of Estate agent of [place];Living will - - - - Living will Living will  Does patient want to make changes to medical advance directive? - - - - - - No - Patient declined  Copy of Healthcare Power of Attorney in Chart? No - copy requested - - - - - No - copy requested  Would patient like information on creating a medical advance directive? - Yes (MAU/Ambulatory/Procedural Areas - Information given) No - Patient declined - No - patient declined information - -    Current Medications  (verified) Outpatient Encounter Medications as of 10/12/2020  Medication Sig   Acetaminophen (TYLENOL EXTRA STRENGTH PO) Take by mouth daily. Takes 2-4 a day   desvenlafaxine (PRISTIQ) 50 MG 24 hr tablet Take 1 tablet (50 mg total) by mouth daily.   diazepam (VALIUM) 10 MG tablet Take 10 mg by mouth daily as needed. Half a tablet   estradiol (ESTRACE) 0.5 MG tablet TAKE 1 TABLET BY MOUTH ONCE DAILY.   fenofibrate (TRICOR) 145 MG tablet TAKE 1 TABLET BY MOUTH ONCE DAILY. (Patient taking differently: No sig reported)   meloxicam (MOBIC) 15 MG tablet Take 1 tablet by mouth daily.   simvastatin (ZOCOR) 40 MG tablet Take 1 tablet (40 mg total) by mouth daily. for cholesterol.   No facility-administered encounter medications on file as of 10/12/2020.    Allergies (verified) Patient has no known allergies.   History: Past Medical History:  Diagnosis Date   AB (asthmatic bronchitis) 10/22/2014   Anxiety    Arthritis    Asthma    Bronchitis    CHRONIC   Depression    Elevated cholesterol    HLD (hyperlipidemia)    Past Surgical History:  Procedure Laterality Date   ABDOMINAL HYSTERECTOMY     APPENDECTOMY     BREAST BIOPSY Left    x2-neg   CATARACT EXTRACTION W/PHACO Left 05/25/2015   Procedure: CATARACT EXTRACTION PHACO AND INTRAOCULAR LENS PLACEMENT (IOC);  Surgeon: Sallee Lange, MD;  Location: [facility] ORS;  Service: Ophthalmology;  Laterality: Left;  [place]: 01:25.9 AP%: 26.3 CDE: 37.92  Lot # [id number]   CATARACT EXTRACTION W/PHACO  Right 07/20/2015   Procedure: CATARACT EXTRACTION PHACO AND INTRAOCULAR LENS PLACEMENT (IOC);  Surgeon: Sallee LangeSteven Dingeldein, MD;  Location: [facility] ORS;  Service: Ophthalmology;  Laterality: Right;  US      1:12.7 AP%    25.3 CDE    29.53 fluid casette lot # [id number]  exp 09/15/2016   Family History  Problem Relation Age of Onset   Cancer Mother        colon   Hematuria Mother    Urinary tract infection Mother    Depression Father    Breast cancer Neg Hx     Kidney cancer Neg Hx    Bladder Cancer Neg Hx    Social History   Socioeconomic History   Marital status: Married    Spouse name: Not on file   Number of children: 1   Years of education: Not on file   Highest education level: 10th grade  Occupational History   Not on file  Tobacco Use   Smoking status: Former    Packs/day: 0.50    Years: 25.00    Pack years: 12.50    Types: Cigarettes    Quit date: 811992    Years since quitting: 30.5   Smokeless tobacco: Never   Tobacco comments:    quit early 90's  Vaping Use   Vaping Use: Never used  Substance and Sexual Activity   Alcohol use: Not Currently    Alcohol/week: 0.0 standard drinks   Drug use: No   Sexual activity: Not Currently  Other Topics Concern   Not on file  Social History Narrative   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   Social Determinants of Corporate investment bankerHealth   Financial Resource Strain: Low Risk    Difficulty of Paying Living Expenses: Not very hard  Food Insecurity: No Food Insecurity   Worried About Programme researcher, broadcasting/film/videounning Out of Food in the Last Year: Never true   Ran Out of Food in the Last Year: Never true  Transportation Needs: No Transportation Needs   Lack of Transportation (Medical): No   Lack of Transportation (Non-Medical): No  Physical Activity: Inactive   Days of Exercise per Week: 0 days   Minutes of Exercise per Session: 0 min  Stress: No Stress Concern Present   Feeling of Stress : Only a little  Social Connections: Moderately Isolated   Frequency of Communication with Friends and Family: More than three times a week   Frequency of Social Gatherings with Friends and Family: Three times a week   Attends Religious Services: Never   Active Member of Clubs or Organizations: No   Attends Engineer, structuralClub or Organization Meetings: Never   Marital Status: Married    Tobacco Counseling Counseling given: Not Answered Tobacco comments: quit early 90's   Clinical Intake:  Pre-visit preparation completed:  Yes  Pain : No/denies pain     Nutritional Risks: None Diabetes: No  How often do you need to have someone help you when you read instructions, pamphlets, or other written materials from your doctor or pharmacy?: 1 - Never   Interpreter Needed?: No  Information entered by :: Reather LittlerKasey Faustino Luecke LPN   Activities of Daily Living In your present state of health, do you have any difficulty performing the following activities: 10/12/2020 09/30/2020  Hearing? N N  Comment declines hearing aids -  Vision? N N  Difficulty concentrating or making decisions? N N  Walking or climbing stairs? Y N  Dressing or bathing? N N  Doing errands, shopping? N N  Preparing Food and eating ? N -  Using the Toilet? N -  In the past six months, have you accidently leaked urine? Y -  Comment wears pads for protection -  Do you have problems with loss of bowel control? N -  Managing your Medications? N -  Managing your Finances? N -  Housekeeping or managing your Housekeeping? N -  Some recent data might be hidden    Patient Care Team: Reubin Milan, MD as PCP - General (Internal Medicine) Jacquelin Hawking, MD as Referring Physician (Psychiatry)  Indicate any recent Medical Services you may have received from other than [facility] providers in the past year (date may be approximate).     Assessment:   This is a routine wellness examination for Jennifer Knapp.  Hearing/Vision screen Hearing Screening - Comments:: Pt denies hearing difficulty Vision Screening - Comments:: Annual vision screenings done in [place]  Dietary issues and exercise activities discussed: Current Exercise Habits: The patient does not participate in regular exercise at present, Exercise limited by: orthopedic condition(s)   Goals Addressed   None    Depression Screen PHQ 2/9 Scores 10/12/2020 09/30/2020 07/09/2020 06/03/2020 06/03/2020 03/04/2020 10/07/2019  PHQ - 2 Score 0 1 0 0 0 0 2  PHQ- 9 Score - 2 0 0 0 0 7    Fall Risk Fall Risk   10/12/2020 09/30/2020 07/09/2020 06/03/2020 06/03/2020  Falls in the past year? 1 0 0 0 0  Number falls in past yr: 0 - - 0 0  Injury with Fall? 0 - - 0 0  Comment - - - - -  Risk Factor Category  - - - - -  Risk for fall due to : No Fall Risks - - - -  Follow up Falls prevention discussed Falls evaluation completed Falls evaluation completed Falls evaluation completed Falls evaluation completed    FALL RISK PREVENTION PERTAINING TO THE HOME:  Any stairs in or around the home? Yes  If so, are there any without handrails? No  Home free of loose throw rugs in walkways, pet beds, electrical cords, etc? Yes  Adequate lighting in your home to reduce risk of falls? Yes   ASSISTIVE DEVICES UTILIZED TO PREVENT FALLS:  Life alert? No  Use of a cane, walker or w/c? No  Grab bars in the bathroom? No  Shower chair or bench in shower? No  Elevated toilet seat or a handicapped toilet? Yes   TIMED UP AND GO:  Was the test performed? No . Telephonic visit.   Cognitive Function: Normal cognitive status assessed by direct observation by this Nurse Health Advisor. No abnormalities found.       6CIT Screen 10/07/2019 06/14/2017 05/31/2016  What Year? 0 points 0 points 0 points  What month? 0 points 0 points 0 points  What time? 0 points 0 points 0 points  Count back from 20 0 points 0 points 0 points  Months in reverse 0 points 0 points 0 points  Repeat phrase 0 points 0 points 0 points  Total Score 0 0 0    Immunizations Immunization History  Administered Date(s) Administered   Fluad Quad(high Dose 65+) 01/29/2020   Influenza,inj,Quad PF,6+ Mos 12/23/2016   Influenza,inj,quad, With Preservative 02/06/2019   Influenza-Unspecified 02/05/2018, 01/17/2020   Moderna Sars-Covid-2 Vaccination 05/03/2019, 05/31/2019, 02/26/2020   Pneumococcal Conjugate-13 05/31/2016   Pneumococcal-Unspecified 04/18/2004   Tdap 01/14/2011    TDAP status: Up to date  Flu Vaccine status: Up to  date  Pneumococcal vaccine  status: Up to date  Covid-19 vaccine status: Completed vaccines  Qualifies for Shingles Vaccine? Yes   Zostavax completed No   Shingrix Completed?: No.    Education has been provided regarding the importance of this vaccine. Patient has been advised to call insurance company to determine out of pocket expense if they have not yet received this vaccine. Advised may also receive vaccine at local pharmacy or Health Dept. Verbalized acceptance and understanding.  Screening Tests Health Maintenance  Topic Date Due   Zoster Vaccines- Shingrix (1 of 2) Never done   COVID-19 Vaccine (4 - Booster for Moderna series) 10/16/2020 (Originally 05/28/2020)   DEXA SCAN  10/12/2021 (Originally 07/14/2004)   INFLUENZA VACCINE  11/16/2020   TETANUS/TDAP  01/13/2021   PNA vac Low Risk Adult  Completed   HPV VACCINES  Aged Out    Health Maintenance  Health Maintenance Due  Topic Date Due   Zoster Vaccines- Shingrix (1 of 2) Never done    Colorectal cancer screening: No longer required.   Mammogram status: No longer required due to age.  Bone density status: not completed, pt declined  Lung Cancer Screening: (Low Dose CT Chest recommended if Age 44-80 years, 30 pack-year currently smoking OR have quit w/in 15years.) does not qualify.   Additional Screening:  Hepatitis C Screening: does not qualify  Vision Screening: Recommended annual ophthalmology exams for early detection of glaucoma and other disorders of the eye. Is the patient up to date with their annual eye exam?  Yes  Who is the provider or what is the name of the office in which the patient attends annual eye exams? Eye provider in [place].   Dental Screening: Recommended annual dental exams for proper oral hygiene  Community Resource Referral / Chronic Care Management: CRR required this visit?  No   CCM required this visit?  No      Plan:     I have personally reviewed and noted the following in  the patient's chart:   Medical and social history Use of alcohol, tobacco or illicit drugs  Current medications and supplements including opioid prescriptions.  Functional ability and status Nutritional status Physical activity Advanced directives List of other physicians Hospitalizations, surgeries, and ER visits in previous 12 months Vitals Screenings to include cognitive, depression, and falls Referrals and appointments  In addition, I have reviewed and discussed with patient certain preventive protocols, quality metrics, and best practice recommendations. A written personalized care plan for preventive services as well as general preventive health recommendations were provided to patient.     Reather Littler, LPN   7/59/1638   Nurse Notes: pt scheduled for right hip replacement 11/02/20.

## 2020-10-12 NOTE — Patient Instructions (Signed)
[name] , Thank you for taking time to come for your Medicare Wellness Visit. I appreciate your ongoing commitment to your health goals. Please review the following plan we discussed and let me know if I can assist you in the future.   Screening recommendations/referrals: Colonoscopy: no longer required Mammogram: no longer required Bone Density: no longer required Recommended yearly ophthalmology/optometry visit for glaucoma screening and checkup Recommended yearly dental visit for hygiene and checkup  Vaccinations: Influenza vaccine: done 01/29/20 Pneumococcal vaccine: done 05/31/16 Tdap vaccine: done 01/14/11 Shingles vaccine: Shingrix discussed. Please contact your pharmacy for coverage information.  Covid-19: done 05/03/19, 05/31/19 & 02/26/20  Advanced directives: Please bring a copy of your health care power of attorney and living will to the office at your convenience once you have completed those documents.   Conditions/risks identified: Recommend increasing physical activity as tolerated  Next appointment: Follow up in one year for your annual wellness visit    Preventive Care 65 Years and Older, Female Preventive care refers to lifestyle choices and visits with your health care provider that can promote health and wellness. What does preventive care include? A yearly physical exam. This is also called an annual well check. Dental exams once or twice a year. Routine eye exams. Ask your health care provider how often you should have your eyes checked. Personal lifestyle choices, including: Daily care of your teeth and gums. Regular physical activity. Eating a healthy diet. Avoiding tobacco and drug use. Limiting alcohol use. Practicing safe sex. Taking low-dose aspirin every day. Taking vitamin and mineral supplements as recommended by your health care provider. What happens during an annual well check? The services and screenings done by your health care provider  during your annual well check will depend on your age, overall health, lifestyle risk factors, and family history of disease. Counseling  Your health care provider may ask you questions about your: Alcohol use. Tobacco use. Drug use. Emotional well-being. Home and relationship well-being. Sexual activity. Eating habits. History of falls. Memory and ability to understand (cognition). Work and work Astronomer. Reproductive health. Screening  You may have the following tests or measurements: Height, weight, and BMI. Blood pressure. Lipid and cholesterol levels. These may be checked every 5 years, or more frequently if you are over 67 years old. Skin check. Lung cancer screening. You may have this screening every year starting at age 16 if you have a 30-pack-year history of smoking and currently smoke or have quit within the past 15 years. Fecal occult blood test (FOBT) of the stool. You may have this test every year starting at age 65. Flexible sigmoidoscopy or colonoscopy. You may have a sigmoidoscopy every 5 years or a colonoscopy every 10 years starting at age 76. Hepatitis C blood test. Hepatitis B blood test. Sexually transmitted disease (STD) testing. Diabetes screening. This is done by checking your blood sugar (glucose) after you have not eaten for a while (fasting). You may have this done every 1-3 years. Bone density scan. This is done to screen for osteoporosis. You may have this done starting at age 3. Mammogram. This may be done every 1-2 years. Talk to your health care provider about how often you should have regular mammograms. Talk with your health care provider about your test results, treatment options, and if necessary, the need for more tests. Vaccines  Your health care provider may recommend certain vaccines, such as: Influenza vaccine. This is recommended every year. Tetanus, diphtheria, and acellular pertussis (Tdap, Td) vaccine. You  may need a Td booster every  10 years. Zoster vaccine. You may need this after age 53. Pneumococcal 13-valent conjugate (PCV13) vaccine. One dose is recommended after age 50. Pneumococcal polysaccharide (PPSV23) vaccine. One dose is recommended after age 47. Talk to your health care provider about which screenings and vaccines you need and how often you need them. This information is not intended to replace advice given to you by your health care provider. Make sure you discuss any questions you have with your health care provider. Document Released: 05/01/2015 Document Revised: 12/23/2015 Document Reviewed: 02/03/2015 Elsevier Interactive Patient Education  2017 [place] Prevention in the Home Falls can cause injuries. They can happen to people of all ages. There are many things you can do to make your home safe and to help prevent falls. What can I do on the outside of my home? Regularly fix the edges of walkways and driveways and fix any cracks. Remove anything that might make you trip as you walk through a door, such as a raised step or threshold. Trim any bushes or trees on the path to your home. Use bright outdoor lighting. Clear any walking paths of anything that might make someone trip, such as rocks or tools. Regularly check to see if handrails are loose or broken. Make sure that both sides of any steps have handrails. Any raised decks and porches should have guardrails on the edges. Have any leaves, snow, or ice cleared regularly. Use sand or salt on walking paths during winter. Clean up any spills in your garage right away. This includes oil or grease spills. What can I do in the bathroom? Use night lights. Install grab bars by the toilet and in the tub and shower. Do not use towel bars as grab bars. Use non-skid mats or decals in the tub or shower. If you need to sit down in the shower, use a plastic, non-slip stool. Keep the floor dry. Clean up any water that spills on the floor as soon as it  happens. Remove soap buildup in the tub or shower regularly. Attach bath mats securely with double-sided non-slip rug tape. Do not have throw rugs and other things on the floor that can make you trip. What can I do in the bedroom? Use night lights. Make sure that you have a light by your bed that is easy to reach. Do not use any sheets or blankets that are too big for your bed. They should not hang down onto the floor. Have a firm chair that has side arms. You can use this for support while you get dressed. Do not have throw rugs and other things on the floor that can make you trip. What can I do in the kitchen? Clean up any spills right away. Avoid walking on wet floors. Keep items that you use a lot in easy-to-reach places. If you need to reach something above you, use a strong step stool that has a grab bar. Keep electrical cords out of the way. Do not use floor polish or wax that makes floors slippery. If you must use wax, use non-skid floor wax. Do not have throw rugs and other things on the floor that can make you trip. What can I do with my stairs? Do not leave any items on the stairs. Make sure that there are handrails on both sides of the stairs and use them. Fix handrails that are broken or loose. Make sure that handrails are as long as the  stairways. Check any carpeting to make sure that it is firmly attached to the stairs. Fix any carpet that is loose or worn. Avoid having throw rugs at the top or bottom of the stairs. If you do have throw rugs, attach them to the floor with carpet tape. Make sure that you have a light switch at the top of the stairs and the bottom of the stairs. If you do not have them, ask someone to add them for you. What else can I do to help prevent falls? Wear shoes that: Do not have high heels. Have rubber bottoms. Are comfortable and fit you well. Are closed at the toe. Do not wear sandals. If you use a stepladder: Make sure that it is fully opened.  Do not climb a closed stepladder. Make sure that both sides of the stepladder are locked into place. Ask someone to hold it for you, if possible. Clearly mark and make sure that you can see: Any grab bars or handrails. First and last steps. Where the edge of each step is. Use tools that help you move around (mobility aids) if they are needed. These include: Canes. Walkers. Scooters. Crutches. Turn on the lights when you go into a dark area. Replace any light bulbs as soon as they burn out. Set up your furniture so you have a clear path. Avoid moving your furniture around. If any of your floors are uneven, fix them. If there are any pets around you, be aware of where they are. Review your medicines with your doctor. Some medicines can make you feel dizzy. This can increase your chance of falling. Ask your doctor what other things that you can do to help prevent falls. This information is not intended to replace advice given to you by your health care provider. Make sure you discuss any questions you have with your health care provider. Document Released: 01/29/2009 Document Revised: 09/10/2015 Document Reviewed: 05/09/2014 Elsevier Interactive Patient Education  2017 [employer].

## 2020-10-13 ENCOUNTER — Telehealth: Payer: Self-pay

## 2020-10-13 NOTE — Telephone Encounter (Signed)
Called pt scheduled an appt for both her and her husband for next week.  KP

## 2020-10-13 NOTE — Telephone Encounter (Unsigned)
Copied from CRM [id number]. Topic: General - Other >> Oct 13, 2020 11:58 AM Herby Abraham C wrote: Reason for CRM: pt called in to speak back with PCP's assistant. Pt says that she spoke with her a little while ago and would like to speak back with her. Pt wouldn't go into details.   Please assist.

## 2020-10-14 ENCOUNTER — Other Ambulatory Visit: Payer: Self-pay

## 2020-10-14 DIAGNOSIS — N289 Disorder of kidney and ureter, unspecified: Secondary | ICD-10-CM

## 2020-10-15 LAB — BASIC METABOLIC PANEL
BUN/Creatinine Ratio: 14 (ref 12–28)
BUN: 18 mg/dL (ref 8–27)
CO2: 23 mmol/L (ref 20–29)
Calcium: 9.4 mg/dL (ref 8.7–10.3)
Chloride: 106 mmol/L (ref 96–106)
Creatinine, Ser: 1.26 mg/dL — ABNORMAL HIGH (ref 0.57–1.00)
Glucose: 103 mg/dL — ABNORMAL HIGH (ref 65–99)
Potassium: 4.7 mmol/L (ref 3.5–5.2)
Sodium: 142 mmol/L (ref 134–144)
eGFR: 43 mL/min/{1.73_m2} — ABNORMAL LOW (ref >59)

## 2020-10-21 ENCOUNTER — Other Ambulatory Visit: Payer: Self-pay

## 2020-10-30 DIAGNOSIS — Z20822 Contact with and (suspected) exposure to covid-19: Secondary | ICD-10-CM | POA: Diagnosis not present

## 2020-11-02 DIAGNOSIS — E782 Mixed hyperlipidemia: Secondary | ICD-10-CM | POA: Diagnosis not present

## 2020-11-02 DIAGNOSIS — F418 Other specified anxiety disorders: Secondary | ICD-10-CM | POA: Diagnosis not present

## 2020-11-02 DIAGNOSIS — F32A Depression, unspecified: Secondary | ICD-10-CM | POA: Diagnosis not present

## 2020-11-02 DIAGNOSIS — R531 Weakness: Secondary | ICD-10-CM | POA: Diagnosis not present

## 2020-11-02 DIAGNOSIS — Z79899 Other long term (current) drug therapy: Secondary | ICD-10-CM | POA: Diagnosis not present

## 2020-11-02 DIAGNOSIS — F419 Anxiety disorder, unspecified: Secondary | ICD-10-CM | POA: Diagnosis not present

## 2020-11-02 DIAGNOSIS — M160 Bilateral primary osteoarthritis of hip: Secondary | ICD-10-CM | POA: Diagnosis not present

## 2020-11-02 DIAGNOSIS — M25751 Osteophyte, right hip: Secondary | ICD-10-CM | POA: Diagnosis not present

## 2020-11-02 DIAGNOSIS — K000 Anodontia: Secondary | ICD-10-CM | POA: Diagnosis not present

## 2020-11-02 DIAGNOSIS — Z87891 Personal history of nicotine dependence: Secondary | ICD-10-CM | POA: Diagnosis not present

## 2020-11-02 DIAGNOSIS — K219 Gastro-esophageal reflux disease without esophagitis: Secondary | ICD-10-CM | POA: Diagnosis not present

## 2020-11-02 DIAGNOSIS — M1611 Unilateral primary osteoarthritis, right hip: Secondary | ICD-10-CM | POA: Diagnosis not present

## 2020-11-02 DIAGNOSIS — R7989 Other specified abnormal findings of blood chemistry: Secondary | ICD-10-CM | POA: Diagnosis not present

## 2020-11-02 DIAGNOSIS — N189 Chronic kidney disease, unspecified: Secondary | ICD-10-CM | POA: Diagnosis not present

## 2020-11-02 DIAGNOSIS — Z791 Long term (current) use of non-steroidal anti-inflammatories (NSAID): Secondary | ICD-10-CM | POA: Diagnosis not present

## 2020-11-02 DIAGNOSIS — Z8616 Personal history of COVID-19: Secondary | ICD-10-CM | POA: Diagnosis not present

## 2020-11-02 DIAGNOSIS — Z96641 Presence of right artificial hip joint: Secondary | ICD-10-CM | POA: Diagnosis not present

## 2020-11-02 DIAGNOSIS — R42 Dizziness and giddiness: Secondary | ICD-10-CM | POA: Diagnosis not present

## 2020-11-03 DIAGNOSIS — F418 Other specified anxiety disorders: Secondary | ICD-10-CM | POA: Diagnosis not present

## 2020-11-03 DIAGNOSIS — K219 Gastro-esophageal reflux disease without esophagitis: Secondary | ICD-10-CM | POA: Diagnosis not present

## 2020-11-03 DIAGNOSIS — N189 Chronic kidney disease, unspecified: Secondary | ICD-10-CM | POA: Diagnosis not present

## 2020-11-03 DIAGNOSIS — Z791 Long term (current) use of non-steroidal anti-inflammatories (NSAID): Secondary | ICD-10-CM | POA: Diagnosis not present

## 2020-11-03 DIAGNOSIS — Z87891 Personal history of nicotine dependence: Secondary | ICD-10-CM | POA: Diagnosis not present

## 2020-11-03 DIAGNOSIS — F32A Depression, unspecified: Secondary | ICD-10-CM | POA: Diagnosis not present

## 2020-11-03 DIAGNOSIS — E782 Mixed hyperlipidemia: Secondary | ICD-10-CM | POA: Diagnosis not present

## 2020-11-03 DIAGNOSIS — R42 Dizziness and giddiness: Secondary | ICD-10-CM | POA: Diagnosis not present

## 2020-11-03 DIAGNOSIS — Z79899 Other long term (current) drug therapy: Secondary | ICD-10-CM | POA: Diagnosis not present

## 2020-11-03 DIAGNOSIS — F419 Anxiety disorder, unspecified: Secondary | ICD-10-CM | POA: Diagnosis not present

## 2020-11-03 DIAGNOSIS — R7989 Other specified abnormal findings of blood chemistry: Secondary | ICD-10-CM | POA: Diagnosis not present

## 2020-11-03 DIAGNOSIS — Z8616 Personal history of COVID-19: Secondary | ICD-10-CM | POA: Diagnosis not present

## 2020-11-03 DIAGNOSIS — M1611 Unilateral primary osteoarthritis, right hip: Secondary | ICD-10-CM | POA: Diagnosis not present

## 2020-11-03 DIAGNOSIS — R531 Weakness: Secondary | ICD-10-CM | POA: Diagnosis not present

## 2020-11-03 DIAGNOSIS — K000 Anodontia: Secondary | ICD-10-CM | POA: Diagnosis not present

## 2020-11-19 DIAGNOSIS — Z471 Aftercare following joint replacement surgery: Secondary | ICD-10-CM | POA: Diagnosis not present

## 2020-11-19 DIAGNOSIS — Z96641 Presence of right artificial hip joint: Secondary | ICD-10-CM | POA: Diagnosis not present

## 2020-12-16 DIAGNOSIS — Z96641 Presence of right artificial hip joint: Secondary | ICD-10-CM | POA: Diagnosis not present

## 2020-12-16 DIAGNOSIS — Z471 Aftercare following joint replacement surgery: Secondary | ICD-10-CM | POA: Diagnosis not present

## 2020-12-17 ENCOUNTER — Other Ambulatory Visit: Payer: Self-pay | Admitting: Internal Medicine

## 2020-12-17 NOTE — Telephone Encounter (Signed)
Requested medications are due for refill today yes  Requested medications are on the active medication list yes  Last refill 09/16/20  Last visit 09/2020  Future visit scheduled 09/2021  Notes to clinic Failed protocol due to mammogram not completed since 2017, please assess.

## 2021-02-16 DIAGNOSIS — F331 Major depressive disorder, recurrent, moderate: Secondary | ICD-10-CM | POA: Diagnosis not present

## 2021-02-16 DIAGNOSIS — F419 Anxiety disorder, unspecified: Secondary | ICD-10-CM | POA: Diagnosis not present

## 2021-03-01 DIAGNOSIS — G8929 Other chronic pain: Secondary | ICD-10-CM | POA: Diagnosis not present

## 2021-03-01 DIAGNOSIS — M5136 Other intervertebral disc degeneration, lumbar region: Secondary | ICD-10-CM | POA: Diagnosis not present

## 2021-03-01 DIAGNOSIS — M545 Low back pain, unspecified: Secondary | ICD-10-CM | POA: Diagnosis not present

## 2021-03-01 DIAGNOSIS — Z87891 Personal history of nicotine dependence: Secondary | ICD-10-CM | POA: Diagnosis not present

## 2021-03-01 DIAGNOSIS — M47816 Spondylosis without myelopathy or radiculopathy, lumbar region: Secondary | ICD-10-CM | POA: Diagnosis not present

## 2021-03-03 ENCOUNTER — Ambulatory Visit: Payer: Self-pay | Admitting: *Deleted

## 2021-03-03 NOTE — Telephone Encounter (Signed)
Called patient to review c/o back pain x 3 days. Patient reports being seen at [facility] on Monday and having CT scan . Reports "not much cushion" between vertebrae. C/o difficulty walking and requires help to get up and down from sitting to standing. [facility] recommended spine clinic for therapy and patient refused due to pain . Patient requesting PCP to have [facility] to come to her home for therapy. Offered appt for today and patient does not want to come in to office due to pain walking. Please advise . Care advise given. Patient verbalized understanding of care advise and to call back or go to [facility] or ED if symptoms worsen.   Reason for Disposition  [1] SEVERE back pain (e.g., excruciating, unable to do any normal activities) AND [2] not improved 2 hours after pain medicine  Answer Assessment - Initial Assessment Questions 1. ONSET: "When did the pain begin?"      3 days ago  2. LOCATION: "Where does it hurt?" (upper, mid or lower back)     Low back across back  3. SEVERITY: "How bad is the pain?"  (e.g., Scale 1-10; mild, moderate, or severe)   - MILD (1-3): doesn't interfere with normal activities    - MODERATE (4-7): interferes with normal activities or awakens from sleep    - SEVERE (8-10): excruciating pain, unable to do any normal activities      Severe pain  4. PATTERN: "Is the pain constant?" (e.g., yes, no; constant, intermittent)      With  movement . Decrease with sitting  5. RADIATION: "Does the pain shoot into your legs or elsewhere?"     no 6. CAUSE:  "What do you think is causing the back pain?"      "No cushion in vertebrae" 7. BACK OVERUSE:  "Any recent lifting of heavy objects, strenuous work or exercise?"     Na  8. MEDICATIONS: "What have you taken so far for the pain?" (e.g., nothing, acetaminophen, NSAIDS)     Tylenol and pain patch  9. NEUROLOGIC SYMPTOMS: "Do you have any weakness, numbness, or problems with bowel/bladder control?"     No , difficulty walking  10. OTHER  SYMPTOMS: "Do you have any other symptoms?" (e.g., fever, abdominal pain, burning with urination, blood in urine)       Denies  11. PREGNANCY: "Is there any chance you are pregnant?" (e.g., yes, no; LMP)       na  Protocols used: Back Pain-A-AH

## 2021-03-05 ENCOUNTER — Encounter: Payer: Self-pay | Admitting: Internal Medicine

## 2021-03-05 ENCOUNTER — Ambulatory Visit (INDEPENDENT_AMBULATORY_CARE_PROVIDER_SITE_OTHER): Payer: Medicare Other | Admitting: Internal Medicine

## 2021-03-05 ENCOUNTER — Other Ambulatory Visit: Payer: Self-pay

## 2021-03-05 VITALS — BP 128/74 | HR 62 | Ht 61.0 in | Wt 134.0 lb

## 2021-03-05 DIAGNOSIS — M5136 Other intervertebral disc degeneration, lumbar region: Secondary | ICD-10-CM

## 2021-03-05 DIAGNOSIS — M51369 Other intervertebral disc degeneration, lumbar region without mention of lumbar back pain or lower extremity pain: Secondary | ICD-10-CM | POA: Insufficient documentation

## 2021-03-05 MED ORDER — TRAMADOL HCL 50 MG PO TABS: 50.0000 mg | ORAL_TABLET | Freq: Two times a day (BID) | ORAL | 0 refills | Status: DC | PRN

## 2021-03-05 MED ORDER — BACLOFEN 10 MG PO TABS: 10.0000 mg | ORAL_TABLET | Freq: Three times a day (TID) | ORAL | 0 refills | Status: DC

## 2021-03-05 NOTE — Progress Notes (Signed)
Date:  03/05/2021   Name:  Jennifer Knapp   DOB:  06-Sep-1940   MRN:  [id number]  Pt is here today with her daughter [name]. Chief Complaint: Back Pain (Whats in home OT and PT. Patient is with her daughter [name] today. )  Back Pain This is a new problem. The current episode started in the past 7 days. The problem has been gradually worsening since onset. The pain does not radiate. The pain is moderate. The pain is Worse during the day. The symptoms are aggravated by bending, twisting and position. Pertinent negatives include no abdominal pain, chest pain, fever, headaches, numbness or weakness.  The pain started suddenly on Monday when she tried to get out of bed.  She was seen in the ED and had a CT lumbar spine.  She was prescribed tylenol and referred to [facility].  She does not think that she can do PT right now.  Lumbar CT 03/01/21: COMPARISON:  No plain films or other lumbar studies available. Marland Kitchen   FINDINGS:    There are 5 nonrib-bearing lumbar type vertebral bodies designated L1-L5.  There is overall preservation of lumbar vertebral body height. There is no  acute appearing fracture identified. There is mild dextroconvex curvature  of the lumbar spine centered at L2-3, with left greater than right sided  disc space narrowing at L2-3.]  Left sided disc space narrowing noted at L1-2. There is relative  preservation of disc space height at L3-4 with mild to moderate narrowing  at L4-5 and L5-S1. Vacuum disc present at L1-2, L4-5 and L5-S1. In the  sagittal plane, there is a 4.5 mm retrolisthesis of L2 on L3 and 2.5 mm  retrolisthesis of L3 on L4 with 8 mm anterolisthesis of L4 on L5.  Multilevel facet arthropathy but most prominent at L4-5 and L5-S1  bilaterally. 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   Other findings: Atherosclerosis identified in major vasculature.   IMPRESSION:  1.  No acute lumbar spine fracture identified.  2. Moderate-severe  multilevel degenerative changes, with areas of  alignment abnormality, thought secondary to chronic degenerative change, as  detailed above.   Electronically Signed by:  Jerilee Field, MD, [facility]  Electronically Signed on:  03/01/2021 1:35 PM  Lab Results  Component Value Date   CREATININE 1.26 (H) 10/14/2020   BUN 18 10/14/2020   NA 142 10/14/2020   K 4.7 10/14/2020   CL 106 10/14/2020   CO2 23 10/14/2020   Lab Results  Component Value Date   CHOL 180 09/30/2020   HDL 67 09/30/2020   LDLCALC 92 09/30/2020   TRIG 120 09/30/2020   CHOLHDL 2.7 09/30/2020   Lab Results  Component Value Date   TSH 5.210 (H) 09/30/2020   No results found for: HGBA1C Lab Results  Component Value Date   WBC 8.6 09/30/2020   HGB 13.5 09/30/2020   HCT 40.3 09/30/2020   MCV 93 09/30/2020   PLT 384 09/30/2020   Lab Results  Component Value Date   ALT 20 09/30/2020   AST 31 09/30/2020   ALKPHOS 51 09/30/2020   BILITOT 0.3 09/30/2020   No components found for: VITD  Review of Systems  Constitutional:  Negative for chills, fatigue and fever.  Respiratory:  Negative for chest tightness and shortness of breath.   Cardiovascular:  Negative for chest pain and leg swelling.  Gastrointestinal:  Negative for abdominal distention and abdominal pain.  Genitourinary:  Negative for  difficulty urinating.  Musculoskeletal:  Positive for back pain and gait problem (using walker).  Neurological:  Negative for dizziness, weakness, numbness and headaches.   Patient Active Problem List   Diagnosis Date Noted   Anxiety and depression 10/07/2020   Gastroesophageal reflux disease without esophagitis 10/07/2020   Current use of estrogen therapy 10/07/2020   Primary osteoarthritis of right hip 08/26/2020   Benign essential tremor 09/24/2019   Primary osteoarthritis of left knee 09/24/2019   Pain of left hip joint 06/01/2018   Neuropathy of foot 12/23/2016   Major depressive disorder with  single episode, in partial remission (HCC) 05/31/2016   Hyperlipidemia, mixed 10/22/2014   B12 nutritional deficiency 10/22/2014   Hot flash, menopausal 10/22/2014    No Known Allergies  Past Surgical History:  Procedure Laterality Date   ABDOMINAL HYSTERECTOMY     APPENDECTOMY     BREAST BIOPSY Left    x2-neg   CATARACT EXTRACTION W/PHACO Left 05/25/2015   Procedure: CATARACT EXTRACTION PHACO AND INTRAOCULAR LENS PLACEMENT (IOC);  Surgeon: Sallee Lange, MD;  Location: [facility] ORS;  Service: Ophthalmology;  Laterality: Left;  [place]: 01:25.9 AP%: 26.3 CDE: 37.92  Lot # [id number]   CATARACT EXTRACTION W/PHACO Right 07/20/2015   Procedure: CATARACT EXTRACTION PHACO AND INTRAOCULAR LENS PLACEMENT (IOC);  Surgeon: Sallee Lange, MD;  Location: [facility] ORS;  Service: Ophthalmology;  Laterality: Right;  [place]      1:12.7 AP%    25.3 CDE    29.53 fluid casette lot # [id number]  exp 09/15/2016    Social History   Tobacco Use   Smoking status: Former    Packs/day: 0.50    Years: 25.00    Pack years: 12.50    Types: Cigarettes    Quit date: 1992    Years since quitting: 30.9   Smokeless tobacco: Never   Tobacco comments:    quit early 90's  Vaping Use   Vaping Use: Never used  Substance Use Topics   Alcohol use: Not Currently    Alcohol/week: 0.0 standard drinks   Drug use: No     Medication list has been reviewed and updated.  Current Meds  Medication Sig   Acetaminophen (TYLENOL EXTRA STRENGTH PO) Take by mouth daily. Takes 2-4 a day   desvenlafaxine (PRISTIQ) 50 MG 24 hr tablet Take 1 tablet (50 mg total) by mouth daily.   diazepam (VALIUM) 10 MG tablet Take 10 mg by mouth daily as needed. Half a tablet   estradiol (ESTRACE) 0.5 MG tablet TAKE 1 TABLET BY MOUTH ONCE DAILY.   fenofibrate (TRICOR) 145 MG tablet TAKE 1 TABLET BY MOUTH ONCE DAILY. (Patient taking differently: No sig reported)   meloxicam (MOBIC) 15 MG tablet Take 1 tablet by mouth daily.   simvastatin (ZOCOR) 40  MG tablet Take 1 tablet (40 mg total) by mouth daily. for cholesterol.   traMADol (ULTRAM) 50 MG tablet Take 50 mg by mouth every 6 (six) hours as needed.    PHQ 2/9 Scores 03/05/2021 10/12/2020 09/30/2020 07/09/2020  PHQ - 2 Score 0 0 1 0  PHQ- 9 Score 1 - 2 0    GAD 7 : Generalized Anxiety Score 03/05/2021 09/30/2020 07/09/2020 06/03/2020  Nervous, Anxious, on Edge 0 0 0 0  Control/stop worrying 0 0 0 0  Worry too much - different things 0 0 0 0  Trouble relaxing 0 0 0 0  Restless 0 0 0 0  Easily annoyed or irritable 2 1 0 0  Afraid - awful  might happen 0 0 0 0  Total GAD 7 Score 2 1 0 0  Anxiety Difficulty Somewhat difficult - - Not difficult at all    BP Readings from Last 3 Encounters:  03/05/21 128/74  09/30/20 122/72  07/09/20 122/78    Physical Exam Constitutional:      Appearance: Normal appearance.  Cardiovascular:     Rate and Rhythm: Normal rate and regular rhythm.     Heart sounds: No murmur heard. Pulmonary:     Effort: Pulmonary effort is normal.     Breath sounds: No wheezing or rhonchi.  Musculoskeletal:     Cervical back: Normal range of motion.     Lumbar back: Spasms and tenderness present. Positive right straight leg raise test and positive left straight leg raise test.     Right lower leg: No edema.     Left lower leg: No edema.  Skin:    General: Skin is warm and dry.  Neurological:     Mental Status: She is alert.    Wt Readings from Last 3 Encounters:  03/05/21 134 lb (60.8 kg)  09/30/20 130 lb (59 kg)  07/09/20 129 lb (58.5 kg)    BP 128/74   Pulse 62   Ht 5\' 1"  (1.549 m)   Wt 134 lb (60.8 kg)   SpO2 97%   BMI 25.32 kg/m   Assessment and Plan: 1. Degenerative disc disease, lumbar Ongoing pain and ambulatory dysfunction - no red flag sx Recommend baclofen tid and tramadol bid prn Refer to Orthopedics for consideration of ESI  - baclofen (LIORESAL) 10 MG tablet; Take 1 tablet (10 mg total) by mouth 3 (three) times daily.  Dispense:  90 each; Refill: 0 - traMADol (ULTRAM) 50 MG tablet; Take 1 tablet (50 mg total) by mouth every 12 (twelve) hours as needed.  Dispense: 30 tablet; Refill: 0 - Ambulatory referral to Orthopedic Surgery   Partially dictated using Dragon software. Any errors are unintentional.  , MD [facility]  03/05/2021

## 2021-03-09 DIAGNOSIS — M545 Low back pain, unspecified: Secondary | ICD-10-CM | POA: Diagnosis not present

## 2021-03-24 DIAGNOSIS — M533 Sacrococcygeal disorders, not elsewhere classified: Secondary | ICD-10-CM | POA: Diagnosis not present

## 2021-04-09 DIAGNOSIS — M791 Myalgia, unspecified site: Secondary | ICD-10-CM | POA: Diagnosis not present

## 2021-04-09 DIAGNOSIS — M533 Sacrococcygeal disorders, not elsewhere classified: Secondary | ICD-10-CM | POA: Diagnosis not present

## 2021-04-16 ENCOUNTER — Other Ambulatory Visit: Payer: Self-pay | Admitting: Internal Medicine

## 2021-04-16 NOTE — Telephone Encounter (Signed)
Requested medication (s) are due for refill today: no  Requested medication (s) are on the active medication list: yes  Last refill:  12/18/20 #90/1RF  Future visit scheduled: no  Notes to clinic:  mammogram isnt up to date. Please advise     Requested Prescriptions  Pending Prescriptions Disp Refills   estradiol (ESTRACE) 0.5 MG tablet [Pharmacy Med Name: ESTRADIOL 0.5 MG TABLET] 90 tablet 11    Sig: TAKE 1 TABLET BY MOUTH ONCE DAILY.     OB/GYN:  Estrogens Failed - 04/16/2021  1:48 PM      Failed - Mammogram is up-to-date per Health Maintenance      Passed - Last BP in normal range    BP Readings from Last 1 Encounters:  03/05/21 128/74          Passed - Valid encounter within last 12 months    Recent Outpatient Visits           1 month ago Degenerative disc disease, lumbar   [facility] Reubin Milan, MD   6 months ago Annual physical exam   [facility] Reubin Milan, MD   9 months ago Chronic congestion of paranasal sinus   [facility] Reubin Milan, MD   10 months ago Bronchitis due to 2019-nCoV   [facility] Reubin Milan, MD   1 year ago Hyperlipidemia, mixed   [facility] Reubin Milan, MD

## 2021-04-18 HISTORY — PX: TOTAL HIP ARTHROPLASTY: SHX124

## 2021-04-29 DIAGNOSIS — M545 Low back pain, unspecified: Secondary | ICD-10-CM | POA: Diagnosis not present

## 2021-05-15 ENCOUNTER — Other Ambulatory Visit: Payer: Self-pay | Admitting: Internal Medicine

## 2021-05-15 DIAGNOSIS — E782 Mixed hyperlipidemia: Secondary | ICD-10-CM

## 2021-05-15 NOTE — Telephone Encounter (Signed)
Requested Prescriptions  Pending Prescriptions Disp Refills   fenofibrate (TRICOR) 145 MG tablet [Pharmacy Med Name: FENOFIBRATE 145 MG TABLET] 90 tablet 1    Sig: TAKE 1 TABLET BY MOUTH ONCE DAILY.     Cardiovascular:  Antilipid - Fibric Acid Derivatives Failed - 05/15/2021 10:51 AM      Failed - ALT in normal range and within 180 days    ALT  Date Value Ref Range Status  09/30/2020 20 0 - 32 IU/L Final         Failed - AST in normal range and within 180 days    AST  Date Value Ref Range Status  09/30/2020 31 0 - 40 IU/L Final         Failed - Cr in normal range and within 180 days    Creatinine, Ser  Date Value Ref Range Status  10/14/2020 1.26 (H) 0.57 - 1.00 mg/dL Final         Failed - eGFR in normal range and within 180 days    GFR calc Af Amer  Date Value Ref Range Status  09/24/2019 59 (L) >59 mL/min/1.73 Final    Comment:    **[facility] currently reports eGFR in compliance with the current**   recommendations of the [employer]. [facility] will   update reporting as new guidelines are published from the NKF-ASN   Task force.    GFR calc non Af Amer  Date Value Ref Range Status  09/24/2019 51 (L) >59 mL/min/1.73 Final   eGFR  Date Value Ref Range Status  10/14/2020 43 (L) >59 mL/min/1.73 Final         Passed - Total Cholesterol in normal range and within 360 days    Cholesterol, Total  Date Value Ref Range Status  09/30/2020 180 100 - 199 mg/dL Final         Passed - LDL in normal range and within 360 days    LDL Chol Calc (NIH)  Date Value Ref Range Status  09/30/2020 92 0 - 99 mg/dL Final         Passed - HDL in normal range and within 360 days    HDL  Date Value Ref Range Status  09/30/2020 67 >39 mg/dL Final         Passed - Triglycerides in normal range and within 360 days    Triglycerides  Date Value Ref Range Status  09/30/2020 120 0 - 149 mg/dL Final         Passed - Valid encounter within last 12 months    Recent  Outpatient Visits          2 months ago Degenerative disc disease, lumbar   [facility] Glean Hess, MD   7 months ago Annual physical exam   [facility] Glean Hess, MD   10 months ago Chronic congestion of paranasal sinus   [facility] Glean Hess, MD   11 months ago Bronchitis due to 2019-nCoV   [facility] Glean Hess, MD   1 year ago Hyperlipidemia, mixed   [facility] Glean Hess, MD

## 2021-05-17 DIAGNOSIS — J019 Acute sinusitis, unspecified: Secondary | ICD-10-CM | POA: Diagnosis not present

## 2021-05-17 DIAGNOSIS — J209 Acute bronchitis, unspecified: Secondary | ICD-10-CM | POA: Diagnosis not present

## 2021-05-19 ENCOUNTER — Ambulatory Visit
Admission: RE | Admit: 2021-05-19 | Discharge: 2021-05-19 | Disposition: A | Discharge status: Home / Self Care | Payer: Medicare Other | Source: Ambulatory Visit | Attending: Internal Medicine | Admitting: Internal Medicine

## 2021-05-19 ENCOUNTER — Ambulatory Visit (INDEPENDENT_AMBULATORY_CARE_PROVIDER_SITE_OTHER): Payer: Medicare Other | Admitting: Internal Medicine

## 2021-05-19 ENCOUNTER — Other Ambulatory Visit: Payer: Self-pay

## 2021-05-19 ENCOUNTER — Ambulatory Visit
Admission: RE | Admit: 2021-05-19 | Discharge: 2021-05-19 | Disposition: A | Discharge status: Home / Self Care | Payer: Medicare Other | Attending: Internal Medicine | Admitting: Internal Medicine

## 2021-05-19 ENCOUNTER — Encounter: Payer: Self-pay | Admitting: Internal Medicine

## 2021-05-19 VITALS — BP 126/78 | HR 95 | Temp 98.6°F | Ht 61.0 in | Wt 133.0 lb

## 2021-05-19 DIAGNOSIS — J189 Pneumonia, unspecified organism: Secondary | ICD-10-CM

## 2021-05-19 DIAGNOSIS — R0602 Shortness of breath: Secondary | ICD-10-CM | POA: Diagnosis not present

## 2021-05-19 DIAGNOSIS — R059 Cough, unspecified: Secondary | ICD-10-CM | POA: Diagnosis not present

## 2021-05-19 MED ORDER — PREDNISONE 10 MG PO TABS: 10.0000 mg | ORAL_TABLET | ORAL | 0 refills | Status: AC

## 2021-05-19 MED ORDER — PROMETHAZINE-DM 6.25-15 MG/5ML PO SYRP: 5.0000 mL | ORAL_SOLUTION | Freq: Four times a day (QID) | ORAL | 0 refills | Status: DC | PRN

## 2021-05-19 MED ORDER — ALBUTEROL SULFATE HFA 108 (90 BASE) MCG/ACT IN AERS: 2.0000 | INHALATION_SPRAY | Freq: Four times a day (QID) | RESPIRATORY_TRACT | 1 refills | Status: AC | PRN

## 2021-05-19 MED ORDER — AMOXICILLIN-POT CLAVULANATE 875-125 MG PO TABS: 1.0000 | ORAL_TABLET | Freq: Two times a day (BID) | ORAL | 0 refills | Status: AC

## 2021-05-19 NOTE — Progress Notes (Signed)
Date:  05/19/2021   Name:  Jennifer Knapp   DOB:  March 27, 1941   MRN:  [id number]   Chief Complaint: Cough, Bronchitis, and Sinusitis  Cough This is a new problem. Episode onset: 6 days. The problem has been gradually worsening. The problem occurs constantly. The cough is Non-productive. Associated symptoms include nasal congestion, rhinorrhea, a sore throat, shortness of breath and wheezing. Pertinent negatives include no chest pain, chills or fever. Treatments tried: cedfdinir and tessalon. The treatment provided no relief. Her past medical history is significant for bronchitis.  Sinusitis Associated symptoms include congestion, coughing, shortness of breath and a sore throat. Pertinent negatives include no chills or diaphoresis.  She was seen in UC and given Cefdinir.  She prefers Augmentin.  She did get a Covid test which was negative.  No CXR or labs. She is eating fairly well and staying hydrated.  Lab Results  Component Value Date   NA 142 10/14/2020   K 4.7 10/14/2020   CO2 23 10/14/2020   GLUCOSE 103 (H) 10/14/2020   BUN 18 10/14/2020   CREATININE 1.26 (H) 10/14/2020   CALCIUM 9.4 10/14/2020   EGFR 43 (L) 10/14/2020   GFRNONAA 51 (L) 09/24/2019   Lab Results  Component Value Date   CHOL 180 09/30/2020   HDL 67 09/30/2020   LDLCALC 92 09/30/2020   TRIG 120 09/30/2020   CHOLHDL 2.7 09/30/2020   Lab Results  Component Value Date   TSH 5.210 (H) 09/30/2020   No results found for: HGBA1C Lab Results  Component Value Date   WBC 8.6 09/30/2020   HGB 13.5 09/30/2020   HCT 40.3 09/30/2020   MCV 93 09/30/2020   PLT 384 09/30/2020   Lab Results  Component Value Date   ALT 20 09/30/2020   AST 31 09/30/2020   ALKPHOS 51 09/30/2020   BILITOT 0.3 09/30/2020   No results found for: 25OHVITD2, 25OHVITD3, VD25OH   Review of Systems  Constitutional:  Positive for fatigue. Negative for chills, diaphoresis, fever and unexpected weight change.  HENT:  Positive for  congestion, rhinorrhea and sore throat.   Respiratory:  Positive for cough, shortness of breath and wheezing.   Cardiovascular:  Negative for chest pain and palpitations.  Gastrointestinal:  Negative for diarrhea and vomiting.   Patient Active Problem List   Diagnosis Date Noted   Degenerative disc disease, lumbar 03/05/2021   Anxiety and depression 10/07/2020   Gastroesophageal reflux disease without esophagitis 10/07/2020   Current use of estrogen therapy 10/07/2020   Primary osteoarthritis of right hip 08/26/2020   Benign essential tremor 09/24/2019   Primary osteoarthritis of left knee 09/24/2019   Pain of left hip joint 06/01/2018   Neuropathy of foot 12/23/2016   Major depressive disorder with single episode, in partial remission (HCC) 05/31/2016   Hyperlipidemia, mixed 10/22/2014   B12 nutritional deficiency 10/22/2014   Hot flash, menopausal 10/22/2014    No Known Allergies  Past Surgical History:  Procedure Laterality Date   ABDOMINAL HYSTERECTOMY     APPENDECTOMY     BREAST BIOPSY Left    x2-neg   CATARACT EXTRACTION W/PHACO Left 05/25/2015   Procedure: CATARACT EXTRACTION PHACO AND INTRAOCULAR LENS PLACEMENT (IOC);  Surgeon: Sallee Lange, MD;  Location: [facility] ORS;  Service: Ophthalmology;  Laterality: Left;  [place]: 01:25.9 AP%: 26.3 CDE: 37.92  Lot # [id number]   CATARACT EXTRACTION W/PHACO Right 07/20/2015   Procedure: CATARACT EXTRACTION PHACO AND INTRAOCULAR LENS PLACEMENT (IOC);  Surgeon: Sallee Lange, MD;  Location: [facility] ORS;  Service: Ophthalmology;  Laterality: Right;  [place]      1:12.7 AP%    25.3 CDE    29.53 fluid casette lot # [id number]  exp 09/15/2016    Social History   Tobacco Use   Smoking status: Former    Packs/day: 0.50    Years: 25.00    Pack years: 12.50    Types: Cigarettes    Quit date: 1992    Years since quitting: 31.1   Smokeless tobacco: Never   Tobacco comments:    quit early 90's  Vaping Use   Vaping Use: Never used   Substance Use Topics   Alcohol use: Not Currently    Alcohol/week: 0.0 standard drinks   Drug use: No     Medication list has been reviewed and updated.  Current Meds  Medication Sig   Acetaminophen (TYLENOL EXTRA STRENGTH PO) Take by mouth daily. Takes 2-4 a day   desvenlafaxine (PRISTIQ) 50 MG 24 hr tablet Take 1 tablet (50 mg total) by mouth daily.   diazepam (VALIUM) 10 MG tablet Take 10 mg by mouth daily as needed. Half a tablet   estradiol (ESTRACE) 0.5 MG tablet Take 1 tablet (0.5 mg total) by mouth daily.   fenofibrate (TRICOR) 145 MG tablet TAKE 1 TABLET BY MOUTH ONCE DAILY.   simvastatin (ZOCOR) 40 MG tablet Take 1 tablet (40 mg total) by mouth daily. for cholesterol.    PHQ 2/9 Scores 05/19/2021 03/05/2021 10/12/2020 09/30/2020  PHQ - 2 Score 1 0 0 1  PHQ- 9 Score 3 1 - 2    GAD 7 : Generalized Anxiety Score 05/19/2021 03/05/2021 09/30/2020 07/09/2020  Nervous, Anxious, on Edge 0 0 0 0  Control/stop worrying 0 0 0 0  Worry too much - different things 0 0 0 0  Trouble relaxing 0 0 0 0  Restless 0 0 0 0  Easily annoyed or irritable 0 2 1 0  Afraid - awful might happen 0 0 0 0  Total GAD 7 Score 0 2 1 0  Anxiety Difficulty - Somewhat difficult - -    BP Readings from Last 3 Encounters:  05/19/21 126/78  03/05/21 128/74  09/30/20 122/72    Physical Exam Vitals and nursing note reviewed.  Constitutional:      General: She is not in acute distress.    Appearance: Normal appearance. She is well-developed.  HENT:     Head: Normocephalic and atraumatic.  Cardiovascular:     Rate and Rhythm: Normal rate and regular rhythm.     Pulses: Normal pulses.  Pulmonary:     Effort: Pulmonary effort is normal. No respiratory distress.     Breath sounds: Examination of the right-upper field reveals decreased breath sounds. Examination of the right-lower field reveals wheezing. Examination of the left-lower field reveals wheezing. Decreased breath sounds and wheezing present.   Musculoskeletal:     Cervical back: Normal range of motion.  Lymphadenopathy:     Cervical: No cervical adenopathy.  Skin:    General: Skin is warm and dry.     Findings: No rash.  Neurological:     Mental Status: She is alert and oriented to person, place, and time.  Psychiatric:        Mood and Affect: Mood normal.        Behavior: Behavior normal.    Wt Readings from Last 3 Encounters:  05/19/21 133 lb (60.3 kg)  03/05/21 134 lb (60.8 kg)  09/30/20 130  lb (59 kg)    BP 126/78    Pulse 95    Temp 98.6 F (37 C) (Oral)    Ht $R'5\' 1"'uE$  (1.549 m)    Wt 133 lb (60.3 kg)    SpO2 93%    BMI 25.13 kg/m   Assessment and Plan: 1. Community acquired pneumonia of right upper lobe of lung Will need CXR to further evaluate and determine follow up. Stop Cefdinir and Tessalon.   Begin augmentin, prednisone, albuterol MDI and cough suppressants - amoxicillin-clavulanate (AUGMENTIN) 875-125 MG tablet; Take 1 tablet by mouth 2 (two) times daily for 10 days.  Dispense: 20 tablet; Refill: 0 - promethazine-dextromethorphan (PROMETHAZINE-DM) 6.25-15 MG/5ML syrup; Take 5 mLs by mouth 4 (four) times daily as needed for cough.  Dispense: 118 mL; Refill: 0 - predniSONE (DELTASONE) 10 MG tablet; Take 1 tablet (10 mg total) by mouth as directed for 6 days. Take 6,5,4,3,2,1 then stop  Dispense: 21 tablet; Refill: 0 - DG Chest 2 View - albuterol (VENTOLIN HFA) 108 (90 Base) MCG/ACT inhaler; Inhale 2 puffs into the lungs every 6 (six) hours as needed for wheezing or shortness of breath.  Dispense: 1 each; Refill: 1   Partially dictated using Editor, commissioning. Any errors are unintentional.  Halina Maidens, MD [facility]  05/19/2021

## 2021-07-15 ENCOUNTER — Other Ambulatory Visit: Payer: Self-pay | Admitting: Internal Medicine

## 2021-07-15 DIAGNOSIS — E782 Mixed hyperlipidemia: Secondary | ICD-10-CM

## 2021-07-16 NOTE — Telephone Encounter (Signed)
Refilled 09/30/2020 #90 3 refills - 1 year supply. ?Requested Prescriptions  ?Pending Prescriptions Disp Refills  ?? simvastatin (ZOCOR) 40 MG tablet [Pharmacy Med Name: SIMVASTATIN 40 MG TABLET] 90 tablet 0  ?  Sig: TAKE 1 TABLET BY MOUTH ONCE DAILY FOR CHOLESTEROL  ?  ? Cardiovascular:  Antilipid - Statins Failed - 07/15/2021 11:44 AM  ?  ?  Failed - Lipid Panel in normal range within the last 12 months  ?  Cholesterol, Total  ?Date Value Ref Range Status  ?09/30/2020 180 100 - 199 mg/dL Final  ? ?LDL Chol Calc (NIH)  ?Date Value Ref Range Status  ?09/30/2020 92 0 - 99 mg/dL Final  ? ?HDL  ?Date Value Ref Range Status  ?09/30/2020 67 >39 mg/dL Final  ? ?Triglycerides  ?Date Value Ref Range Status  ?09/30/2020 120 0 - 149 mg/dL Final  ? ?  ?  ?  Passed - Patient is not pregnant  ?  ?  Passed - Valid encounter within last 12 months  ?  Recent Outpatient Visits   ?      ? 1 month ago Community acquired pneumonia of right upper lobe of lung  ? [facility] Reubin Milan, MD  ? 4 months ago Degenerative disc disease, lumbar  ? [facility] Reubin Milan, MD  ? 9 months ago Annual physical exam  ? [facility] Reubin Milan, MD  ? 1 year ago Chronic congestion of paranasal sinus  ? [facility] Reubin Milan, MD  ? 1 year ago Bronchitis due to 2019-nCoV  ? [facility] Reubin Milan, MD  ?  ?  ? ?  ?  ?  ? ?

## 2021-08-12 ENCOUNTER — Other Ambulatory Visit: Payer: Self-pay | Admitting: Internal Medicine

## 2021-08-12 DIAGNOSIS — E782 Mixed hyperlipidemia: Secondary | ICD-10-CM

## 2021-08-13 NOTE — Telephone Encounter (Signed)
Last RF 09/30/20 #90 3 RF too soon should have enough to last until app ? ?Requested Prescriptions  ?Refused Prescriptions Disp Refills  ?? simvastatin (ZOCOR) 40 MG tablet [Pharmacy Med Name: SIMVASTATIN 40 MG TABLET] 90 tablet 0  ?  Sig: TAKE 1 TABLET BY MOUTH ONCE DAILY FOR CHOLESTEROL  ?  ? Cardiovascular:  Antilipid - Statins Failed - 08/12/2021  2:49 PM  ?  ?  Failed - Lipid Panel in normal range within the last 12 months  ?  Cholesterol, Total  ?Date Value Ref Range Status  ?09/30/2020 180 100 - 199 mg/dL Final  ? ?LDL Chol Calc (NIH)  ?Date Value Ref Range Status  ?09/30/2020 92 0 - 99 mg/dL Final  ? ?HDL  ?Date Value Ref Range Status  ?09/30/2020 67 >39 mg/dL Final  ? ?Triglycerides  ?Date Value Ref Range Status  ?09/30/2020 120 0 - 149 mg/dL Final  ? ?  ?  ?  Passed - Patient is not pregnant  ?  ?  Passed - Valid encounter within last 12 months  ?  Recent Outpatient Visits   ?      ? 2 months ago Community acquired pneumonia of right upper lobe of lung  ? [facility] Glean Hess, MD  ? 5 months ago Degenerative disc disease, lumbar  ? [facility] Glean Hess, MD  ? 10 months ago Annual physical exam  ? [facility] Glean Hess, MD  ? 1 year ago Chronic congestion of paranasal sinus  ? [facility] Glean Hess, MD  ? 1 year ago Bronchitis due to 2019-nCoV  ? [facility] Glean Hess, MD  ?  ?  ? ?  ?  ?  ? ? ?

## 2021-08-18 DIAGNOSIS — F331 Major depressive disorder, recurrent, moderate: Secondary | ICD-10-CM | POA: Diagnosis not present

## 2021-08-19 ENCOUNTER — Encounter: Payer: Self-pay | Admitting: Internal Medicine

## 2021-08-19 ENCOUNTER — Ambulatory Visit (INDEPENDENT_AMBULATORY_CARE_PROVIDER_SITE_OTHER): Payer: Medicare Other | Admitting: Internal Medicine

## 2021-08-19 VITALS — BP 126/70 | HR 101 | Temp 98.4°F | Ht 61.0 in | Wt 141.2 lb

## 2021-08-19 DIAGNOSIS — J0100 Acute maxillary sinusitis, unspecified: Secondary | ICD-10-CM

## 2021-08-19 DIAGNOSIS — F324 Major depressive disorder, single episode, in partial remission: Secondary | ICD-10-CM

## 2021-08-19 MED ORDER — AMOXICILLIN 250 MG/5ML PO SUSR: 500.0000 mg | Freq: Two times a day (BID) | ORAL | 0 refills | Status: DC

## 2021-08-19 NOTE — Progress Notes (Signed)
? ? ?Date:  08/19/2021  ? ?Name:  Jennifer Knapp   DOB:  06-23-1940   MRN:  [id number] ? ? ?Chief Complaint: Cough ? ?Cough ?This is a new problem. Episode onset: 2 days ago. The problem has been waxing and waning. The cough is Non-productive. Associated symptoms include postnasal drip (Green Mucuous) and a sore throat. Pertinent negatives include no chest pain, chills, ear pain, fever, nasal congestion, shortness of breath or wheezing.  ? ?Lab Results  ?Component Value Date  ? NA 142 10/14/2020  ? K 4.7 10/14/2020  ? CO2 23 10/14/2020  ? GLUCOSE 103 (H) 10/14/2020  ? BUN 18 10/14/2020  ? CREATININE 1.26 (H) 10/14/2020  ? CALCIUM 9.4 10/14/2020  ? EGFR 43 (L) 10/14/2020  ? GFRNONAA 51 (L) 09/24/2019  ? ?Lab Results  ?Component Value Date  ? CHOL 180 09/30/2020  ? HDL 67 09/30/2020  ? [place] 92 09/30/2020  ? TRIG 120 09/30/2020  ? CHOLHDL 2.7 09/30/2020  ? ?Lab Results  ?Component Value Date  ? TSH 5.210 (H) 09/30/2020  ? ?No results found for: HGBA1C ?Lab Results  ?Component Value Date  ? WBC 8.6 09/30/2020  ? HGB 13.5 09/30/2020  ? HCT 40.3 09/30/2020  ? MCV 93 09/30/2020  ? PLT 384 09/30/2020  ? ?Lab Results  ?Component Value Date  ? ALT 20 09/30/2020  ? AST 31 09/30/2020  ? ALKPHOS 51 09/30/2020  ? BILITOT 0.3 09/30/2020  ? ?No results found for: 25OHVITD2, [place], VD25OH  ? ?Review of Systems  ?Constitutional:  Negative for chills, fatigue and fever.  ?HENT:  Positive for postnasal drip (Green Mucuous), sinus pressure and sore throat. Negative for ear pain, trouble swallowing and voice change.   ?Respiratory:  Positive for cough. Negative for chest tightness, shortness of breath and wheezing.   ?Cardiovascular:  Negative for chest pain.  ?Psychiatric/Behavioral:  Negative for dysphoric mood and sleep disturbance. The patient is not nervous/anxious.   ? ?Patient Active Problem List  ? Diagnosis Date Noted  ?? Degenerative disc disease, lumbar 03/05/2021  ?? Anxiety and depression 10/07/2020  ??  Gastroesophageal reflux disease without esophagitis 10/07/2020  ?? Current use of estrogen therapy 10/07/2020  ?? Primary osteoarthritis of right hip 08/26/2020  ?? Benign essential tremor 09/24/2019  ?? Primary osteoarthritis of left knee 09/24/2019  ?? Pain of left hip joint 06/01/2018  ?? Neuropathy of foot 12/23/2016  ?? Major depressive disorder with single episode, in partial remission ([place]) 05/31/2016  ?? Hyperlipidemia, mixed 10/22/2014  ?? B12 nutritional deficiency 10/22/2014  ?? Hot flash, menopausal 10/22/2014  ? ? ?No Known Allergies ? ?Past Surgical History:  ?Procedure Laterality Date  ?? ABDOMINAL HYSTERECTOMY    ?? APPENDECTOMY    ?? BREAST BIOPSY Left   ? x2-neg  ?? CATARACT EXTRACTION W/PHACO Left 05/25/2015  ? Procedure: CATARACT EXTRACTION PHACO AND INTRAOCULAR LENS PLACEMENT (IOC);  Surgeon: Estill Cotta, MD;  Location: [facility] ORS;  Service: Ophthalmology;  Laterality: Left;  [place]: 01:25.9 ?AP%: 26.3 ?CDE: 37.92 ? ?Lot # [id number]  ?? CATARACT EXTRACTION W/PHACO Right 07/20/2015  ? Procedure: CATARACT EXTRACTION PHACO AND INTRAOCULAR LENS PLACEMENT (IOC);  Surgeon: Estill Cotta, MD;  Location: [facility] ORS;  Service: Ophthalmology;  Laterality: Right;  [place]      1:12.7 ?AP%    25.3 ?CDE    29.53 ?fluid casette lot # [id number]  exp 09/15/2016  ? ? ?Social History  ? ?Tobacco Use  ?? Smoking status: Former  ?  Packs/day: 0.50  ?  Years: 25.00  ?  Pack years: 12.50  ?  Types: Cigarettes  ?  Quit date: 72  ?  Years since quitting: 31.3  ?? Smokeless tobacco: Never  ?? Tobacco comments:  ?  quit early 90's  ?Vaping Use  ?? Vaping Use: Never used  ?Substance Use Topics  ?? Alcohol use: Not Currently  ?  Alcohol/week: 0.0 standard drinks  ?? Drug use: No  ? ? ? ?Medication list has been reviewed and updated. ? ?Current Meds  ?Medication Sig  ?? Acetaminophen (TYLENOL EXTRA STRENGTH PO) Take by mouth daily. Takes 2-4 a day  ?? albuterol (VENTOLIN HFA) 108 (90 Base) MCG/ACT inhaler Inhale 2 puffs into the  lungs every 6 (six) hours as needed for wheezing or shortness of breath.  ?? desvenlafaxine (PRISTIQ) 50 MG 24 hr tablet Take 1 tablet (50 mg total) by mouth daily.  ?? diazepam (VALIUM) 10 MG tablet Take 10 mg by mouth daily as needed. Half a tablet  ?? estradiol (ESTRACE) 0.5 MG tablet Take 1 tablet (0.5 mg total) by mouth daily.  ?? fenofibrate (TRICOR) 145 MG tablet TAKE 1 TABLET BY MOUTH ONCE DAILY.  ?? simvastatin (ZOCOR) 40 MG tablet Take 1 tablet (40 mg total) by mouth daily. for cholesterol.  ? ? ? ?  08/19/2021  ? 11:40 AM 05/19/2021  ? 11:20 AM 03/05/2021  ? 11:15 AM 09/30/2020  ?  9:30 AM  ?GAD 7 : Generalized Anxiety Score  ?Nervous, Anxious, on Edge 0 0 0 0  ?Control/stop worrying 0 0 0 0  ?Worry too much - different things 0 0 0 0  ?Trouble relaxing 0 0 0 0  ?Restless 0 0 0 0  ?Easily annoyed or irritable 0 0 2 1  ?Afraid - awful might happen 0 0 0 0  ?Total GAD 7 Score 0 0 2 1  ?Anxiety Difficulty Not difficult at all  Somewhat difficult   ? ? ? ?  08/19/2021  ? 11:40 AM  ?Depression screen PHQ 2/9  ?Decreased Interest 1  ?Down, Depressed, Hopeless 0  ?PHQ - 2 Score 1  ?Altered sleeping 0  ?Tired, decreased energy 2  ?Change in appetite 0  ?Feeling bad or failure about yourself  0  ?Trouble concentrating 0  ?Moving slowly or fidgety/restless 0  ?Suicidal thoughts 0  ?PHQ-9 Score 3  ?Difficult doing work/chores Not difficult at all  ? ? ?BP Readings from Last 3 Encounters:  ?08/19/21 126/70  ?05/19/21 126/78  ?03/05/21 128/74  ? ? ?Physical Exam ?Constitutional:   ?   Appearance: Normal appearance. She is well-developed.  ?HENT:  ?   Right Ear: Ear canal and external ear normal. Tympanic membrane is not erythematous or retracted.  ?   Left Ear: Ear canal and external ear normal. Tympanic membrane is not erythematous or retracted.  ?   Nose:  ?   Right Sinus: Maxillary sinus tenderness present. No frontal sinus tenderness.  ?   Left Sinus: Maxillary sinus tenderness present. No frontal sinus tenderness.  ?    Mouth/Throat:  ?   Mouth: No oral lesions.  ?   Pharynx: Uvula midline. Posterior oropharyngeal erythema present. No oropharyngeal exudate.  ?Cardiovascular:  ?   Rate and Rhythm: Normal rate and regular rhythm.  ?   Pulses: Normal pulses.  ?   Heart sounds: Normal heart sounds.  ?Pulmonary:  ?   Breath sounds: Normal breath sounds. No wheezing or rales.  ?Musculoskeletal:  ?   Cervical back: Normal  range of motion.  ?Lymphadenopathy:  ?   Cervical: No cervical adenopathy.  ?Neurological:  ?   Mental Status: She is alert and oriented to person, place, and time.  ? ? ?Wt Readings from Last 3 Encounters:  ?08/19/21 141 lb 3.2 oz (64 kg)  ?05/19/21 133 lb (60.3 kg)  ?03/05/21 134 lb (60.8 kg)  ? ? ?BP 126/70   Pulse (!) 101   Temp 98.4 ?F (36.9 ?C) (Oral)   Ht $R'5\' 1"'RE$  (1.549 m)   Wt 141 lb 3.2 oz (64 kg)   SpO2 94%   BMI 26.68 kg/m?  ? ?Assessment and Plan: ?1. Acute non-recurrent maxillary sinusitis ?Recommend Claritin 10 mg daily to reduce PND. ?- amoxicillin (AMOXIL) 250 MG/5ML suspension; Take 10 mLs (500 mg total) by mouth 2 (two) times daily.  Dispense: 200 mL; Refill: 0 ? ?2. Major depressive disorder with single episode, in partial remission ([place]) ?Clinically stable on current regimen with good control of symptoms, No SI or HI. ?Will continue current therapy. ? ?Partially dictated using Editor, commissioning. Any errors are unintentional. ? ?Halina Maidens, MD ?[facility] ?[facility] ? ?08/19/2021 ? ? ? ? ? ?

## 2021-09-16 ENCOUNTER — Other Ambulatory Visit: Payer: Self-pay | Admitting: Internal Medicine

## 2021-09-16 DIAGNOSIS — E782 Mixed hyperlipidemia: Secondary | ICD-10-CM

## 2021-09-17 NOTE — Telephone Encounter (Signed)
Requested Prescriptions  Pending Prescriptions Disp Refills  . simvastatin (ZOCOR) 40 MG tablet [Pharmacy Med Name: SIMVASTATIN 40 MG TABLET] 90 tablet 0    Sig: TAKE 1 TABLET BY MOUTH ONCE DAILY FOR CHOLESTEROL     Cardiovascular:  Antilipid - Statins Failed - 09/16/2021  3:53 PM      Failed - Lipid Panel in normal range within the last 12 months    Cholesterol, Total  Date Value Ref Range Status  09/30/2020 180 100 - 199 mg/dL Final   LDL Chol Calc (NIH)  Date Value Ref Range Status  09/30/2020 92 0 - 99 mg/dL Final   HDL  Date Value Ref Range Status  09/30/2020 67 >39 mg/dL Final   Triglycerides  Date Value Ref Range Status  09/30/2020 120 0 - 149 mg/dL Final         Passed - Patient is not pregnant      Passed - Valid encounter within last 12 months    Recent Outpatient Visits          4 weeks ago Acute non-recurrent maxillary sinusitis   [facility] Reubin Milan, MD   4 months ago Community acquired pneumonia of right upper lobe of lung   [facility] Reubin Milan, MD   6 months ago Degenerative disc disease, lumbar   [facility] Reubin Milan, MD   11 months ago Annual physical exam   [facility] Reubin Milan, MD   1 year ago Chronic congestion of paranasal sinus   [facility] Reubin Milan, MD

## 2021-10-04 ENCOUNTER — Encounter: Payer: Medicare Other | Admitting: Internal Medicine

## 2021-10-13 ENCOUNTER — Ambulatory Visit (INDEPENDENT_AMBULATORY_CARE_PROVIDER_SITE_OTHER): Payer: Medicare Other

## 2021-10-13 DIAGNOSIS — Z0000 Encounter for general adult medical examination without abnormal findings: Secondary | ICD-10-CM

## 2021-10-13 NOTE — Progress Notes (Signed)
Subjective:   Jennifer Knapp is a 81 y.o. female who presents for Medicare Annual (Subsequent) preventive examination.  Virtual Visit via Telephone Note  I connected with  [name] on 10/13/21 at  1:30 PM EDT by telephone and verified that I am speaking with the correct person using two identifiers.  Location: Patient: home Provider: [facility] Persons participating in the virtual visit: patient/Nurse Health Advisor   I discussed the limitations, risks, security and privacy concerns of performing an evaluation and management service by telephone and the availability of in person appointments. The patient expressed understanding and agreed to proceed.  Interactive audio and video telecommunications were attempted between this nurse and patient, however failed, due to patient having technical difficulties OR patient did not have access to video capability.  We continued and completed visit with audio only.  Some vital signs may be absent or patient reported.   Reather Littler, LPN   Review of Systems     Cardiac Risk Factors include: advanced age (>87men, >51 women);dyslipidemia     Objective:    There were no vitals filed for this visit. There is no height or weight on file to calculate BMI.     10/13/2021    1:38 PM 10/12/2020    1:31 PM 10/07/2019    1:31 PM 09/24/2018    2:57 PM 05/31/2016   10:50 AM 07/20/2015    8:35 AM 06/10/2015    3:09 PM  Advanced Directives  Does Patient Have a Medical Advance Directive? Yes Yes No No No No Yes  Type of Estate agent of [place];Living will Healthcare Power of [place];Living will     Living will  Copy of Healthcare Power of Attorney in Chart? No - copy requested No - copy requested       Would patient like information on creating a medical advance directive?   Yes (MAU/Ambulatory/Procedural Areas - Information given) No - Patient declined  No - patient declined information     Current Medications  (verified) Outpatient Encounter Medications as of 10/13/2021  Medication Sig   Acetaminophen (TYLENOL EXTRA STRENGTH PO) Take by mouth daily. Takes 2-4 a day   albuterol (VENTOLIN HFA) 108 (90 Base) MCG/ACT inhaler Inhale 2 puffs into the lungs every 6 (six) hours as needed for wheezing or shortness of breath.   desvenlafaxine (PRISTIQ) 50 MG 24 hr tablet Take 1 tablet (50 mg total) by mouth daily.   diazepam (VALIUM) 10 MG tablet Take 10 mg by mouth daily as needed. Half a tablet   estradiol (ESTRACE) 0.5 MG tablet Take 1 tablet (0.5 mg total) by mouth daily.   fenofibrate (TRICOR) 145 MG tablet TAKE 1 TABLET BY MOUTH ONCE DAILY.   simvastatin (ZOCOR) 40 MG tablet TAKE 1 TABLET BY MOUTH ONCE DAILY FOR CHOLESTEROL   [DISCONTINUED] amoxicillin (AMOXIL) 250 MG/5ML suspension Take 10 mLs (500 mg total) by mouth 2 (two) times daily.   No facility-administered encounter medications on file as of 10/13/2021.    Allergies (verified) Patient has no known allergies.   History: Past Medical History:  Diagnosis Date   AB (asthmatic bronchitis) 10/22/2014   Anxiety    Arthritis    Asthma    Bronchitis    CHRONIC   Depression    Elevated cholesterol    HLD (hyperlipidemia)    Past Surgical History:  Procedure Laterality Date   ABDOMINAL HYSTERECTOMY     APPENDECTOMY     BREAST BIOPSY Left    x2-neg  CATARACT EXTRACTION W/PHACO Left 05/25/2015   Procedure: CATARACT EXTRACTION PHACO AND INTRAOCULAR LENS PLACEMENT (IOC);  Surgeon: Estill Cotta, MD;  Location: [facility] ORS;  Service: Ophthalmology;  Laterality: Left;  [place]: 01:25.9 AP%: 26.3 CDE: 37.92  Lot # [id number]   CATARACT EXTRACTION W/PHACO Right 07/20/2015   Procedure: CATARACT EXTRACTION PHACO AND INTRAOCULAR LENS PLACEMENT (IOC);  Surgeon: Estill Cotta, MD;  Location: [facility] ORS;  Service: Ophthalmology;  Laterality: Right;  [place]      1:12.7 AP%    25.3 CDE    29.53 fluid casette lot # [id number]  exp 09/15/2016   Family History   Problem Relation Age of Onset   Cancer Mother        colon   Hematuria Mother    Urinary tract infection Mother    Depression Father    Breast cancer Neg Hx    Kidney cancer Neg Hx    Bladder Cancer Neg Hx    Social History   Socioeconomic History   Marital status: Married    Spouse name: Not on file   Number of children: 1   Years of education: Not on file   Highest education level: 10th grade  Occupational History   Not on file  Tobacco Use   Smoking status: Former    Packs/day: 0.50    Years: 25.00    Total pack years: 12.50    Types: Cigarettes    Quit date: 44    Years since quitting: 31.5   Smokeless tobacco: Never   Tobacco comments:    quit early 90's  Vaping Use   Vaping Use: Never used  Substance and Sexual Activity   Alcohol use: Not Currently    Alcohol/week: 0.0 standard drinks of alcohol   Drug use: No   Sexual activity: Not Currently  Other Topics Concern   Not on file  Social History Narrative   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   Social Determinants of Health   Financial Resource Strain: Low Risk  (10/13/2021)   Overall Financial Resource Strain (CARDIA)    Difficulty of Paying Living Expenses: Not very hard  Food Insecurity: No Food Insecurity (10/13/2021)   Hunger Vital Sign    Worried About Running Out of Food in the Last Year: Never true    Ran Out of Food in the Last Year: Never true  Transportation Needs: No Transportation Needs (10/13/2021)   PRAPARE - Hydrologist (Medical): No    Lack of Transportation (Non-Medical): No  Physical Activity: Inactive (10/13/2021)   Exercise Vital Sign    Days of Exercise per Week: 0 days    Minutes of Exercise per Session: 0 min  Stress: No Stress Concern Present (10/13/2021)   [place]    Feeling of Stress : Not at all  Social Connections: Moderately  Isolated (10/13/2021)   Social Connection and Isolation Panel [NHANES]    Frequency of Communication with Friends and Family: More than three times a week    Frequency of Social Gatherings with Friends and Family: Three times a week    Attends Religious Services: Never    Active Member of Clubs or Organizations: No    Attends Archivist Meetings: Never    Marital Status: Married    Tobacco Counseling Counseling given: Not Answered Tobacco comments: quit early 90's   Clinical Intake:  Pre-visit preparation completed: Yes  Pain : No/denies pain     Nutritional Risks: None Diabetes: No  How often do you need to have someone help you when you read instructions, pamphlets, or other written materials from your doctor or pharmacy?: 1 - Never   Interpreter Needed?: No  Information entered by :: Clemetine Marker LPN   Activities of Daily Living    10/13/2021    1:39 PM 05/19/2021   11:20 AM  In your present state of health, do you have any difficulty performing the following activities:  Hearing? 0 0  Vision? 0 0  Difficulty concentrating or making decisions? 0 0  Walking or climbing stairs? 1 0  Dressing or bathing? 0 0  Doing errands, shopping? 0 1  Preparing Food and eating ? N   Using the Toilet? N   In the past six months, have you accidently leaked urine? Y   Comment wears pads for protection   Do you have problems with loss of bowel control? N   Managing your Medications? N   Managing your Finances? N   Housekeeping or managing your Housekeeping? N     Patient Care Team: Glean Hess, MD as PCP - General (Internal Medicine) Sherri Rad, MD as Referring Physician (Psychiatry)  Indicate any recent Medical Services you may have received from other than [facility] providers in the past year (date may be approximate).     Assessment:   This is a routine wellness examination for [place].  Hearing/Vision screen Hearing Screening - Comments:: Pt denies  hearing difficulty Vision Screening - Comments:: Annual vision screenings done in [place]  Dietary issues and exercise activities discussed: Current Exercise Habits: The patient does not participate in regular exercise at present   Goals Addressed   None    Depression Screen    10/13/2021    1:37 PM 08/19/2021   11:40 AM 05/19/2021   11:20 AM 03/05/2021   11:15 AM 10/12/2020    1:30 PM 09/30/2020    9:29 AM 07/09/2020   11:44 AM  PHQ 2/9 Scores  PHQ - 2 Score 0 1 1 0 0 1 0  PHQ- 9 Score 2 3 3 1  2  0    Fall Risk    10/13/2021    1:39 PM 08/19/2021   11:40 AM 05/19/2021   11:20 AM 03/05/2021   11:16 AM 10/12/2020    1:32 PM  Fall Risk   Falls in the past year? 1 1 0 0 1  Number falls in past yr: 0 0 0 0 0  Injury with Fall? 0 0 0 0 0  Risk for fall due to : History of fall(s) No Fall Risks No Fall Risks No Fall Risks No Fall Risks  Follow up Falls prevention discussed Falls evaluation completed Falls evaluation completed Falls evaluation completed Falls prevention discussed    FALL RISK PREVENTION PERTAINING TO THE HOME:  Any stairs in or around the home? Yes  If so, are there any without handrails? No  Home free of loose throw rugs in walkways, pet beds, electrical cords, etc? Yes  Adequate lighting in your home to reduce risk of falls? Yes   ASSISTIVE DEVICES UTILIZED TO PREVENT FALLS:  Life alert? No  Use of a cane, walker or w/c? No  Grab bars in the bathroom? No  Shower chair or bench in shower? No  Elevated toilet seat or a handicapped toilet? Yes   TIMED UP AND GO:  Was the test performed? No . Telephonic visit.  Cognitive Function: Normal cognitive status assessed by direct observation by this Nurse Health Advisor. No abnormalities found.          10/07/2019    1:33 PM 06/14/2017   10:29 AM 05/31/2016   10:58 AM  6CIT Screen  What Year? 0 points 0 points 0 points  What month? 0 points 0 points 0 points  What time? 0 points 0 points 0 points  Count back  from 20 0 points 0 points 0 points  Months in reverse 0 points 0 points 0 points  Repeat phrase 0 points 0 points 0 points  Total Score 0 points 0 points 0 points    Immunizations Immunization History  Administered Date(s) Administered   Fluad Quad(high Dose 65+) 01/29/2020   Influenza,inj,Quad PF,6+ Mos 12/23/2016   Influenza,inj,quad, With Preservative 02/06/2019   Influenza-Unspecified 02/05/2018, 01/17/2020, 01/18/2021   Moderna Sars-Covid-2 Vaccination 05/03/2019, 05/31/2019, 02/26/2020   Pneumococcal Conjugate-13 05/31/2016   Pneumococcal-Unspecified 04/18/2004   Tdap 01/14/2011   Unspecified SARS-COV-2 Vaccination 05/03/2019, 05/31/2019, 02/26/2020, 04/22/2020    TDAP status: Due, Education has been provided regarding the importance of this vaccine. Advised may receive this vaccine at local pharmacy or Health Dept. Aware to provide a copy of the vaccination record if obtained from local pharmacy or Health Dept. Verbalized acceptance and understanding.  Flu Vaccine status: Up to date  Pneumococcal vaccine status: Up to date  Covid-19 vaccine status: Completed vaccines  Qualifies for Shingles Vaccine? Yes   Zostavax completed No   Shingrix Completed?: No.    Education has been provided regarding the importance of this vaccine. Patient has been advised to call insurance company to determine out of pocket expense if they have not yet received this vaccine. Advised may also receive vaccine at local pharmacy or Health Dept. Verbalized acceptance and understanding.  Screening Tests Health Maintenance  Topic Date Due   Zoster Vaccines- Shingrix (1 of 2) Never done   DEXA SCAN  Never done   Pneumonia Vaccine 48+ Years old (2 - PPSV23 if available, else PCV20) 05/31/2017   TETANUS/TDAP  01/13/2021   INFLUENZA VACCINE  11/16/2021   COVID-19 Vaccine  Completed   HPV VACCINES  Aged Out    Health Maintenance  Health Maintenance Due  Topic Date Due   Zoster Vaccines- Shingrix  (1 of 2) Never done   DEXA SCAN  Never done   Pneumonia Vaccine 41+ Years old (2 - PPSV23 if available, else PCV20) 05/31/2017   TETANUS/TDAP  01/13/2021    Colorectal cancer screening: No longer required.   Mammogram status: No longer required due to age.  Bone density status: no longer required due to age  Lung Cancer Screening: (Low Dose CT Chest recommended if Age 64-80 years, 30 pack-year currently smoking OR have quit w/in 15years.) does not qualify.  Additional Screening:  Hepatitis C Screening: does not quality  Vision Screening: Recommended annual ophthalmology exams for early detection of glaucoma and other disorders of the eye. Is the patient up to date with their annual eye exam?  Yes  Who is the provider or what is the name of the office in which the patient attends annual eye exams? Eye dr in [place].   Dental Screening: Recommended annual dental exams for proper oral hygiene  Community Resource Referral / Chronic Care Management: CRR required this visit?  No   CCM required this visit?  No      Plan:     I have personally reviewed and noted the following in  the patient's chart:   Medical and social history Use of alcohol, tobacco or illicit drugs  Current medications and supplements including opioid prescriptions.  Functional ability and status Nutritional status Physical activity Advanced directives List of other physicians Hospitalizations, surgeries, and ER visits in previous 12 months Vitals Screenings to include cognitive, depression, and falls Referrals and appointments  In addition, I have reviewed and discussed with patient certain preventive protocols, quality metrics, and best practice recommendations. A written personalized care plan for preventive services as well as general preventive health recommendations were provided to patient.     Clemetine Marker, LPN   [id number]   Nurse Notes: pt advised due for CPE & fasting labs; scheduled for  11/09/21 8:40

## 2021-10-13 NOTE — Patient Instructions (Signed)
Jennifer Knapp , Thank you for taking time to come for your Medicare Wellness Visit. I appreciate your ongoing commitment to your health goals. Please review the following plan we discussed and let me know if I can assist you in the future.   Screening recommendations/referrals: Colonoscopy: no longer required Mammogram: no longer required Bone Density: no longer required Recommended yearly ophthalmology/optometry visit for glaucoma screening and checkup Recommended yearly dental visit for hygiene and checkup  Vaccinations: Influenza vaccine: done 01/18/21 Pneumococcal vaccine: done 05/31/16 Tdap vaccine: due Shingles vaccine: Shingrix discussed. Please contact your pharmacy for coverage information.  Covid-19:done 05/03/19, 05/31/19 & 02/26/20  Advanced directives: Please bring a copy of your health care power of attorney and living will to the office at your convenience.   Conditions/risks identified: recommend increasing physical activity   Next appointment: Follow up in one year for your annual wellness visit    Preventive Care 65 Years and Older, Female Preventive care refers to lifestyle choices and visits with your health care provider that can promote health and wellness. What does preventive care include? A yearly physical exam. This is also called an annual well check. Dental exams once or twice a year. Routine eye exams. Ask your health care provider how often you should have your eyes checked. Personal lifestyle choices, including: Daily care of your teeth and gums. Regular physical activity. Eating a healthy diet. Avoiding tobacco and drug use. Limiting alcohol use. Practicing safe sex. Taking low-dose aspirin every day. Taking vitamin and mineral supplements as recommended by your health care provider. What happens during an annual well check? The services and screenings done by your health care provider during your annual well check will depend on your age, overall  health, lifestyle risk factors, and family history of disease. Counseling  Your health care provider may ask you questions about your: Alcohol use. Tobacco use. Drug use. Emotional well-being. Home and relationship well-being. Sexual activity. Eating habits. History of falls. Memory and ability to understand (cognition). Work and work Astronomer. Reproductive health. Screening  You may have the following tests or measurements: Height, weight, and BMI. Blood pressure. Lipid and cholesterol levels. These may be checked every 5 years, or more frequently if you are over 63 years old. Skin check. Lung cancer screening. You may have this screening every year starting at age 81 if you have a 30-pack-year history of smoking and currently smoke or have quit within the past 15 years. Fecal occult blood test (FOBT) of the stool. You may have this test every year starting at age 22. Flexible sigmoidoscopy or colonoscopy. You may have a sigmoidoscopy every 5 years or a colonoscopy every 10 years starting at age 43. Hepatitis C blood test. Hepatitis B blood test. Sexually transmitted disease (STD) testing. Diabetes screening. This is done by checking your blood sugar (glucose) after you have not eaten for a while (fasting). You may have this done every 1-3 years. Bone density scan. This is done to screen for osteoporosis. You may have this done starting at age 49. Mammogram. This may be done every 1-2 years. Talk to your health care provider about how often you should have regular mammograms. Talk with your health care provider about your test results, treatment options, and if necessary, the need for more tests. Vaccines  Your health care provider may recommend certain vaccines, such as: Influenza vaccine. This is recommended every year. Tetanus, diphtheria, and acellular pertussis (Tdap, Td) vaccine. You may need a Td booster every 10 years. Zoster  vaccine. You may need this after age  50. Pneumococcal 13-valent conjugate (PCV13) vaccine. One dose is recommended after age 83. Pneumococcal polysaccharide (PPSV23) vaccine. One dose is recommended after age 54. Talk to your health care provider about which screenings and vaccines you need and how often you need them. This information is not intended to replace advice given to you by your health care provider. Make sure you discuss any questions you have with your health care provider. Document Released: 05/01/2015 Document Revised: 12/23/2015 Document Reviewed: 02/03/2015 Elsevier Interactive Patient Education  2017 [place] Prevention in the Home Falls can cause injuries. They can happen to people of all ages. There are many things you can do to make your home safe and to help prevent falls. What can I do on the outside of my home? Regularly fix the edges of walkways and driveways and fix any cracks. Remove anything that might make you trip as you walk through a door, such as a raised step or threshold. Trim any bushes or trees on the path to your home. Use bright outdoor lighting. Clear any walking paths of anything that might make someone trip, such as rocks or tools. Regularly check to see if handrails are loose or broken. Make sure that both sides of any steps have handrails. Any raised decks and porches should have guardrails on the edges. Have any leaves, snow, or ice cleared regularly. Use sand or salt on walking paths during winter. Clean up any spills in your garage right away. This includes oil or grease spills. What can I do in the bathroom? Use night lights. Install grab bars by the toilet and in the tub and shower. Do not use towel bars as grab bars. Use non-skid mats or decals in the tub or shower. If you need to sit down in the shower, use a plastic, non-slip stool. Keep the floor dry. Clean up any water that spills on the floor as soon as it happens. Remove soap buildup in the tub or shower  regularly. Attach bath mats securely with double-sided non-slip rug tape. Do not have throw rugs and other things on the floor that can make you trip. What can I do in the bedroom? Use night lights. Make sure that you have a light by your bed that is easy to reach. Do not use any sheets or blankets that are too big for your bed. They should not hang down onto the floor. Have a firm chair that has side arms. You can use this for support while you get dressed. Do not have throw rugs and other things on the floor that can make you trip. What can I do in the kitchen? Clean up any spills right away. Avoid walking on wet floors. Keep items that you use a lot in easy-to-reach places. If you need to reach something above you, use a strong step stool that has a grab bar. Keep electrical cords out of the way. Do not use floor polish or wax that makes floors slippery. If you must use wax, use non-skid floor wax. Do not have throw rugs and other things on the floor that can make you trip. What can I do with my stairs? Do not leave any items on the stairs. Make sure that there are handrails on both sides of the stairs and use them. Fix handrails that are broken or loose. Make sure that handrails are as long as the stairways. Check any carpeting to make sure that it  is firmly attached to the stairs. Fix any carpet that is loose or worn. Avoid having throw rugs at the top or bottom of the stairs. If you do have throw rugs, attach them to the floor with carpet tape. Make sure that you have a light switch at the top of the stairs and the bottom of the stairs. If you do not have them, ask someone to add them for you. What else can I do to help prevent falls? Wear shoes that: Do not have high heels. Have rubber bottoms. Are comfortable and fit you well. Are closed at the toe. Do not wear sandals. If you use a stepladder: Make sure that it is fully opened. Do not climb a closed stepladder. Make sure that  both sides of the stepladder are locked into place. Ask someone to hold it for you, if possible. Clearly mark and make sure that you can see: Any grab bars or handrails. First and last steps. Where the edge of each step is. Use tools that help you move around (mobility aids) if they are needed. These include: Canes. Walkers. Scooters. Crutches. Turn on the lights when you go into a dark area. Replace any light bulbs as soon as they burn out. Set up your furniture so you have a clear path. Avoid moving your furniture around. If any of your floors are uneven, fix them. If there are any pets around you, be aware of where they are. Review your medicines with your doctor. Some medicines can make you feel dizzy. This can increase your chance of falling. Ask your doctor what other things that you can do to help prevent falls. This information is not intended to replace advice given to you by your health care provider. Make sure you discuss any questions you have with your health care provider. Document Released: 01/29/2009 Document Revised: 09/10/2015 Document Reviewed: 05/09/2014 Elsevier Interactive Patient Education  2017 [employer].

## 2021-10-14 ENCOUNTER — Other Ambulatory Visit: Payer: Self-pay | Admitting: Internal Medicine

## 2021-10-14 NOTE — Telephone Encounter (Signed)
Requested Prescriptions  Pending Prescriptions Disp Refills  . estradiol (ESTRACE) 0.5 MG tablet [Pharmacy Med Name: ESTRADIOL 0.5 MG TABLET] 90 tablet 0    Sig: TAKE 1 TABLET BY MOUTH ONCE DAILY.     OB/GYN:  Estrogens Failed - 10/14/2021 10:55 AM      Failed - Mammogram is up-to-date per Health Maintenance      Passed - Last BP in normal range    BP Readings from Last 1 Encounters:  08/19/21 126/70         Passed - Valid encounter within last 12 months    Recent Outpatient Visits          1 month ago Acute non-recurrent maxillary sinusitis   [facility] Reubin Milan, MD   4 months ago Community acquired pneumonia of right upper lobe of lung   [facility] Reubin Milan, MD   7 months ago Degenerative disc disease, lumbar   [facility] Reubin Milan, MD   1 year ago Annual physical exam   [facility] Reubin Milan, MD   1 year ago Chronic congestion of paranasal sinus   [facility] Reubin Milan, MD      Future Appointments            In 3 weeks Judithann Graves Nyoka Cowden, MD [facility], [facility]

## 2021-11-02 DIAGNOSIS — Z471 Aftercare following joint replacement surgery: Secondary | ICD-10-CM | POA: Diagnosis not present

## 2021-11-02 DIAGNOSIS — Z96641 Presence of right artificial hip joint: Secondary | ICD-10-CM | POA: Diagnosis not present

## 2021-11-02 DIAGNOSIS — Z09 Encounter for follow-up examination after completed treatment for conditions other than malignant neoplasm: Secondary | ICD-10-CM | POA: Diagnosis not present

## 2021-11-09 ENCOUNTER — Ambulatory Visit (INDEPENDENT_AMBULATORY_CARE_PROVIDER_SITE_OTHER): Payer: Medicare Other | Admitting: Internal Medicine

## 2021-11-09 ENCOUNTER — Encounter: Payer: Self-pay | Admitting: Internal Medicine

## 2021-11-09 VITALS — BP 135/78 | HR 75 | Ht 61.0 in | Wt 141.0 lb

## 2021-11-09 DIAGNOSIS — Z23 Encounter for immunization: Secondary | ICD-10-CM | POA: Diagnosis not present

## 2021-11-09 DIAGNOSIS — N1832 Chronic kidney disease, stage 3b: Secondary | ICD-10-CM

## 2021-11-09 DIAGNOSIS — E782 Mixed hyperlipidemia: Secondary | ICD-10-CM | POA: Diagnosis not present

## 2021-11-09 DIAGNOSIS — E538 Deficiency of other specified B group vitamins: Secondary | ICD-10-CM | POA: Diagnosis not present

## 2021-11-09 DIAGNOSIS — Z0000 Encounter for general adult medical examination without abnormal findings: Secondary | ICD-10-CM

## 2021-11-09 DIAGNOSIS — F324 Major depressive disorder, single episode, in partial remission: Secondary | ICD-10-CM

## 2021-11-09 NOTE — Progress Notes (Signed)
Date:  11/09/2021   Name:  Jennifer Knapp   DOB:  07-19-1940   MRN:  [id number]   Chief Complaint: Annual Exam (Breast Exam.) Jennifer Knapp is a 81 y.o. female who presents today for her Complete Annual Exam. She feels well. She reports exercising very little. She reports she is sleeping well. Breast complaints - none. She is doing well.  She declines mammograms and DEXA.  Her husband is still in SNF but doing well.  She is more rested and relaxed now.  Mammogram: 2017 - discontinued DEXA: none Pap smear: discontinued Colonoscopy: aged out  Health Maintenance Due  Topic Date Due   Zoster Vaccines- Shingrix (1 of 2) Never done   DEXA SCAN  Never done   TETANUS/TDAP  01/13/2021    Immunization History  Administered Date(s) Administered   Fluad Quad(high Dose 65+) 01/29/2020   Influenza,inj,Quad PF,6+ Mos 12/23/2016   Influenza,inj,quad, With Preservative 02/06/2019   Influenza-Unspecified 02/05/2018, 01/17/2020, 01/18/2021   Moderna Sars-Covid-2 Vaccination 05/03/2019, 05/31/2019, 02/26/2020   PNEUMOCOCCAL CONJUGATE-20 11/09/2021   Pneumococcal Conjugate-13 05/31/2016   Pneumococcal-Unspecified 04/18/2004   Tdap 01/14/2011   Unspecified SARS-COV-2 Vaccination 05/03/2019, 05/31/2019, 02/26/2020, 04/22/2020    Hyperlipidemia This is a chronic problem. The problem is controlled. Pertinent negatives include no chest pain or shortness of breath. Current antihyperlipidemic treatment includes statins and fibric acid derivatives. The current treatment provides significant improvement of lipids.  Depression        This is a chronic problem.The problem is unchanged.  Associated symptoms include no fatigue and no headaches.  Past treatments include SSRIs - Selective serotonin reuptake inhibitors.   Lab Results  Component Value Date   NA 142 10/14/2020   K 4.7 10/14/2020   CO2 23 10/14/2020   GLUCOSE 103 (H) 10/14/2020   BUN 18 10/14/2020   CREATININE 1.26 (H) 10/14/2020    CALCIUM 9.4 10/14/2020   EGFR 43 (L) 10/14/2020   GFRNONAA 51 (L) 09/24/2019   Lab Results  Component Value Date   CHOL 180 09/30/2020   HDL 67 09/30/2020   LDLCALC 92 09/30/2020   TRIG 120 09/30/2020   CHOLHDL 2.7 09/30/2020   Lab Results  Component Value Date   TSH 5.210 (H) 09/30/2020   No results found for: "HGBA1C" Lab Results  Component Value Date   WBC 8.6 09/30/2020   HGB 13.5 09/30/2020   HCT 40.3 09/30/2020   MCV 93 09/30/2020   PLT 384 09/30/2020   Lab Results  Component Value Date   ALT 20 09/30/2020   AST 31 09/30/2020   ALKPHOS 51 09/30/2020   BILITOT 0.3 09/30/2020   No results found for: "25OHVITD2", "25OHVITD3", "VD25OH"   Review of Systems  Constitutional:  Negative for chills, fatigue and fever.  HENT:  Negative for congestion, hearing loss, tinnitus, trouble swallowing and voice change.   Eyes:  Negative for visual disturbance.  Respiratory:  Negative for cough, chest tightness, shortness of breath and wheezing.   Cardiovascular:  Negative for chest pain, palpitations and leg swelling.  Gastrointestinal:  Negative for abdominal pain, constipation, diarrhea and vomiting.  Endocrine: Negative for polydipsia and polyuria.  Genitourinary:  Negative for dysuria, frequency, genital sores, vaginal bleeding and vaginal discharge.  Musculoskeletal:  Negative for arthralgias, gait problem and joint swelling.  Skin:  Negative for color change and rash.  Neurological:  Negative for dizziness, tremors, light-headedness and headaches.  Hematological:  Negative for adenopathy. Does not bruise/bleed easily.  Psychiatric/Behavioral:  Positive for depression. Negative  for dysphoric mood and sleep disturbance. The patient is not nervous/anxious.     Patient Active Problem List   Diagnosis Date Noted   Stage 3b chronic kidney disease (CKD) ([place]) 11/09/2021   Degenerative disc disease, lumbar 03/05/2021   Gastroesophageal reflux disease without esophagitis  10/07/2020   Current use of estrogen therapy 10/07/2020   Primary osteoarthritis of right hip 08/26/2020   Benign essential tremor 09/24/2019   Primary osteoarthritis of left knee 09/24/2019   Pain of left hip joint 06/01/2018   Neuropathy of foot 12/23/2016   Major depressive disorder with single episode, in partial remission ([place]) 05/31/2016   Hyperlipidemia, mixed 10/22/2014   B12 nutritional deficiency 10/22/2014   Hot flash, menopausal 10/22/2014    No Known Allergies  Past Surgical History:  Procedure Laterality Date   ABDOMINAL HYSTERECTOMY     APPENDECTOMY     BREAST BIOPSY Left    x2-neg   CATARACT EXTRACTION W/PHACO Left 05/25/2015   Procedure: CATARACT EXTRACTION PHACO AND INTRAOCULAR LENS PLACEMENT ([place]);  Surgeon: Estill Cotta, MD;  Location: [facility] ORS;  Service: Ophthalmology;  Laterality: Left;  [place]: 01:25.9 AP%: 26.3 CDE: 37.92  Lot # [id number]   CATARACT EXTRACTION W/PHACO Right 07/20/2015   Procedure: CATARACT EXTRACTION PHACO AND INTRAOCULAR LENS PLACEMENT ([facility]);  Surgeon: Estill Cotta, MD;  Location: [facility] ORS;  Service: Ophthalmology;  Laterality: Right;  [place]      1:12.7 AP%    25.3 CDE    29.53 fluid casette lot # [id number]  exp 09/15/2016    Social History   Tobacco Use   Smoking status: Former    Packs/day: 0.50    Years: 25.00    Total pack years: 12.50    Types: Cigarettes    Quit date: 1992    Years since quitting: 31.5   Smokeless tobacco: Never   Tobacco comments:    quit early 90's  Vaping Use   Vaping Use: Never used  Substance Use Topics   Alcohol use: Not Currently    Alcohol/week: 0.0 standard drinks of alcohol   Drug use: No     Medication list has been reviewed and updated.  Current Meds  Medication Sig   Acetaminophen (TYLENOL EXTRA STRENGTH PO) Take by mouth daily. Takes 2-4 a day   albuterol (VENTOLIN HFA) 108 (90 Base) MCG/ACT inhaler Inhale 2 puffs into the lungs every 6 (six) hours as needed for wheezing or  shortness of breath.   desvenlafaxine (PRISTIQ) 50 MG 24 hr tablet Take 1 tablet (50 mg total) by mouth daily.   diazepam (VALIUM) 10 MG tablet Take 10 mg by mouth daily as needed. Half a tablet   estradiol (ESTRACE) 0.5 MG tablet TAKE 1 TABLET BY MOUTH ONCE DAILY.   fenofibrate (TRICOR) 145 MG tablet TAKE 1 TABLET BY MOUTH ONCE DAILY.   naproxen sodium (ALEVE) 220 MG tablet Take 220 mg by mouth.   simvastatin (ZOCOR) 40 MG tablet TAKE 1 TABLET BY MOUTH ONCE DAILY FOR CHOLESTEROL       11/09/2021    8:48 AM 08/19/2021   11:40 AM 05/19/2021   11:20 AM 03/05/2021   11:15 AM  GAD 7 : Generalized Anxiety Score  Nervous, Anxious, on Edge 0 0 0 0  Control/stop worrying 0 0 0 0  Worry too much - different things 0 0 0 0  Trouble relaxing 0 0 0 0  Restless 0 0 0 0  Easily annoyed or irritable 0 0 0 2  Afraid - awful might  happen 0 0 0 0  Total GAD 7 Score 0 0 0 2  Anxiety Difficulty Not difficult at all Not difficult at all  Somewhat difficult       11/09/2021    8:48 AM 10/13/2021    1:37 PM 08/19/2021   11:40 AM  Depression screen PHQ 2/9  Decreased Interest 0 0 1  Down, Depressed, Hopeless 0 0 0  PHQ - 2 Score 0 0 1  Altered sleeping 0 0 0  Tired, decreased energy $RemoveBeforeDE'1 2 2  'rIwArZyQlrTiDAE$ Change in appetite 0 0 0  Feeling bad or failure about yourself  0 0 0  Trouble concentrating 0 0 0  Moving slowly or fidgety/restless 0 0 0  Suicidal thoughts 0 0 0  PHQ-9 Score $RemoveBef'1 2 3  'YQyQntyznj$ Difficult doing work/chores Not difficult at all Not difficult at all Not difficult at all    BP Readings from Last 3 Encounters:  11/09/21 135/78  08/19/21 126/70  05/19/21 126/78    Physical Exam Vitals and nursing note reviewed.  Constitutional:      General: She is not in acute distress.    Appearance: She is well-developed.  HENT:     Head: Normocephalic and atraumatic.     Right Ear: Tympanic membrane and ear canal normal.     Left Ear: Tympanic membrane and ear canal normal.     Nose:     Right Sinus: No  maxillary sinus tenderness.     Left Sinus: No maxillary sinus tenderness.  Eyes:     General: No scleral icterus.       Right eye: No discharge.        Left eye: No discharge.     Conjunctiva/sclera: Conjunctivae normal.  Neck:     Thyroid: No thyromegaly.     Vascular: No carotid bruit.  Cardiovascular:     Rate and Rhythm: Normal rate and regular rhythm.     Pulses: Normal pulses.     Heart sounds: Normal heart sounds.  Pulmonary:     Effort: Pulmonary effort is normal. No respiratory distress.     Breath sounds: No wheezing.  Chest:  Breasts:    Right: No mass, nipple discharge, skin change or tenderness.     Left: No mass, nipple discharge, skin change or tenderness.  Abdominal:     General: Bowel sounds are normal.     Palpations: Abdomen is soft.     Tenderness: There is no abdominal tenderness.  Musculoskeletal:     Cervical back: Normal range of motion. No erythema.     Right lower leg: No edema.     Left lower leg: No edema.  Lymphadenopathy:     Cervical: No cervical adenopathy.  Skin:    General: Skin is warm and dry.     Capillary Refill: Capillary refill takes less than 2 seconds.     Findings: No rash.  Neurological:     General: No focal deficit present.     Mental Status: She is alert and oriented to person, place, and time.     Cranial Nerves: No cranial nerve deficit.     Sensory: No sensory deficit.     Deep Tendon Reflexes: Reflexes are normal and symmetric.  Psychiatric:        Attention and Perception: Attention normal.        Mood and Affect: Mood normal.     Wt Readings from Last 3 Encounters:  11/09/21 141 lb (64 kg)  08/19/21 141 lb  3.2 oz (64 kg)  05/19/21 133 lb (60.3 kg)    BP 135/78 (BP Location: Right Arm, Cuff Size: Normal)   Pulse 75   Ht $R'5\' 1"'sB$  (1.549 m)   Wt 141 lb (64 kg)   SpO2 97%   BMI 26.64 kg/m   Assessment and Plan: 1. Annual physical exam Normal exam. Continue healthy diet, exercise as able. She continues on  Estrace for severe vasomotor symptoms   2. Hyperlipidemia, mixed - Comprehensive metabolic panel - Lipid panel  3. Major depressive disorder with single episode, in partial remission (HCC) Clinically stable on current regimen with good control of symptoms, No SI or HI. Will continue current therapy by Psych. - TSH  4. B12 nutritional deficiency Check labs and supplement if needed - CBC with Differential/Platelet  5. Need for vaccination for pneumococcus - Pneumococcal conjugate vaccine 20-valent  6. Stage 3b chronic kidney disease (CKD) (HCC) Stable GFR; avoid continuous high doses of NSAIDS - Comprehensive metabolic panel   Partially dictated using Editor, commissioning. Any errors are unintentional.  Halina Maidens, MD [facility]  11/09/2021

## 2021-11-10 LAB — CBC WITH DIFFERENTIAL/PLATELET
Basophils Absolute: 0 10*3/uL (ref 0.0–0.2)
Basos: 0 %
EOS (ABSOLUTE): 0.2 10*3/uL (ref 0.0–0.4)
Eos: 3 %
Hematocrit: 39.5 % (ref 34.0–46.6)
Hemoglobin: 13.3 g/dL (ref 11.1–15.9)
Immature Grans (Abs): 0 10*3/uL (ref 0.0–0.1)
Immature Granulocytes: 0 %
Lymphocytes Absolute: 2.1 10*3/uL (ref 0.7–3.1)
Lymphs: 29 %
MCH: 30.7 pg (ref 26.6–33.0)
MCHC: 33.7 g/dL (ref 31.5–35.7)
MCV: 91 fL (ref 79–97)
Monocytes Absolute: 0.5 10*3/uL (ref 0.1–0.9)
Monocytes: 6 %
Neutrophils Absolute: 4.4 10*3/uL (ref 1.4–7.0)
Neutrophils: 62 %
Platelets: 339 10*3/uL (ref 150–450)
RBC: 4.33 x10E6/uL (ref 3.77–5.28)
RDW: 12.9 % (ref 11.7–15.4)
WBC: 7.3 10*3/uL (ref 3.4–10.8)

## 2021-11-10 LAB — LIPID PANEL
Chol/HDL Ratio: 2.5 ratio (ref 0.0–4.4)
Cholesterol, Total: 170 mg/dL (ref 100–199)
HDL: 67 mg/dL (ref >39)
LDL Chol Calc (NIH): 89 mg/dL (ref 0–99)
Triglycerides: 74 mg/dL (ref 0–149)
VLDL Cholesterol Cal: 14 mg/dL (ref 5–40)

## 2021-11-10 LAB — COMPREHENSIVE METABOLIC PANEL
ALT: 18 IU/L (ref 0–32)
AST: 29 IU/L (ref 0–40)
Albumin/Globulin Ratio: 1.8 (ref 1.2–2.2)
Albumin: 4.4 g/dL (ref 3.7–4.7)
Alkaline Phosphatase: 38 IU/L — ABNORMAL LOW (ref 44–121)
BUN/Creatinine Ratio: 14 (ref 12–28)
BUN: 15 mg/dL (ref 8–27)
Bilirubin Total: 0.4 mg/dL (ref 0.0–1.2)
CO2: 23 mmol/L (ref 20–29)
Calcium: 9.8 mg/dL (ref 8.7–10.3)
Chloride: 104 mmol/L (ref 96–106)
Creatinine, Ser: 1.08 mg/dL — ABNORMAL HIGH (ref 0.57–1.00)
Globulin, Total: 2.4 g/dL (ref 1.5–4.5)
Glucose: 90 mg/dL (ref 70–99)
Potassium: 4.9 mmol/L (ref 3.5–5.2)
Sodium: 139 mmol/L (ref 134–144)
Total Protein: 6.8 g/dL (ref 6.0–8.5)
eGFR: 51 mL/min/{1.73_m2} — ABNORMAL LOW (ref >59)

## 2021-11-10 LAB — TSH: TSH: 4.49 u[IU]/mL (ref 0.450–4.500)

## 2021-11-12 ENCOUNTER — Other Ambulatory Visit: Payer: Self-pay | Admitting: Internal Medicine

## 2021-11-12 DIAGNOSIS — E782 Mixed hyperlipidemia: Secondary | ICD-10-CM

## 2021-11-15 NOTE — Telephone Encounter (Signed)
Requested Prescriptions  Pending Prescriptions Disp Refills  . fenofibrate (TRICOR) 145 MG tablet [Pharmacy Med Name: FENOFIBRATE 145 MG TABLET] 90 tablet 0    Sig: TAKE 1 TABLET BY MOUTH ONCE DAILY.     Cardiovascular:  Antilipid - Fibric Acid Derivatives Failed - 11/12/2021  3:49 PM      Failed - Cr in normal range and within 360 days    Creatinine, Ser  Date Value Ref Range Status  11/09/2021 1.08 (H) 0.57 - 1.00 mg/dL Final         Failed - Lipid Panel in normal range within the last 12 months    Cholesterol, Total  Date Value Ref Range Status  11/09/2021 170 100 - 199 mg/dL Final   LDL Chol Calc (NIH)  Date Value Ref Range Status  11/09/2021 89 0 - 99 mg/dL Final   HDL  Date Value Ref Range Status  11/09/2021 67 >39 mg/dL Final   Triglycerides  Date Value Ref Range Status  11/09/2021 74 0 - 149 mg/dL Final         Passed - ALT in normal range and within 360 days    ALT  Date Value Ref Range Status  11/09/2021 18 0 - 32 IU/L Final         Passed - AST in normal range and within 360 days    AST  Date Value Ref Range Status  11/09/2021 29 0 - 40 IU/L Final         Passed - HGB in normal range and within 360 days    Hemoglobin  Date Value Ref Range Status  11/09/2021 13.3 11.1 - 15.9 g/dL Final         Passed - HCT in normal range and within 360 days    Hematocrit  Date Value Ref Range Status  11/09/2021 39.5 34.0 - 46.6 % Final         Passed - PLT in normal range and within 360 days    Platelets  Date Value Ref Range Status  11/09/2021 339 150 - 450 x10E3/uL Final         Passed - WBC in normal range and within 360 days    WBC  Date Value Ref Range Status  11/09/2021 7.3 3.4 - 10.8 x10E3/uL Final  04/20/2015 15.4 (H) 3.6 - 11.0 K/uL Final         Passed - eGFR is 30 or above and within 360 days    GFR calc Af Amer  Date Value Ref Range Status  09/24/2019 59 (L) >59 mL/min/1.73 Final    Comment:    **[facility] currently reports eGFR in  compliance with the current**   recommendations of the [employer]. [facility] will   update reporting as new guidelines are published from the NKF-ASN   Task force.    GFR calc non Af Amer  Date Value Ref Range Status  09/24/2019 51 (L) >59 mL/min/1.73 Final   eGFR  Date Value Ref Range Status  11/09/2021 51 (L) >59 mL/min/1.73 Final         Passed - Valid encounter within last 12 months    Recent Outpatient Visits          6 days ago Annual physical exam   [facility] Glean Hess, MD   2 months ago Acute non-recurrent maxillary sinusitis   [facility] Glean Hess, MD   6 months ago Community acquired pneumonia of right upper lobe of lung  [facility] Glean Hess, MD   8 months ago Degenerative disc disease, lumbar   [facility] Glean Hess, MD   1 year ago Annual physical exam   [facility] Glean Hess, MD      Future Appointments            In 1 year Army Melia, Jesse Sans, MD [facility], [facility]

## 2022-01-12 ENCOUNTER — Ambulatory Visit: Payer: Self-pay

## 2022-01-12 NOTE — Telephone Encounter (Signed)
Pt called, she states that she was seen several weeks ago and was unsure what vaccine she received. Had OV on 11/09/21 for annual exam and pt received PNA vaccine. Advised pt of this and she states she didn't need further assistance. States she will be getting COVID booster and Flu vaccine as well. Pt advised to contact local pharmacy as office doesn't have flu vaccines in at this time. Pt verbalized understanding.   Summary: vaccine   Pt called in for assistance. Pt says that she was seen today and given a vaccine. Pt says that she is unsure of what she was given and would like a call back to discuss.    Please advise.

## 2022-01-18 ENCOUNTER — Other Ambulatory Visit: Payer: Self-pay | Admitting: Internal Medicine

## 2022-01-18 DIAGNOSIS — E782 Mixed hyperlipidemia: Secondary | ICD-10-CM

## 2022-01-18 MED ORDER — SIMVASTATIN 40 MG PO TABS: 40.0000 mg | ORAL_TABLET | Freq: Every day | ORAL | 3 refills | Status: DC

## 2022-01-18 NOTE — Telephone Encounter (Signed)
[place] request refill for the following:  Requested Prescriptions   Pending Prescriptions Disp Refills   simvastatin (ZOCOR) 40 MG tablet 90 tablet 0    Sig: Take 1 tablet (40 mg total) by mouth daily. for cholesterol.

## 2022-01-18 NOTE — Telephone Encounter (Signed)
Requested Prescriptions  Pending Prescriptions Disp Refills  . simvastatin (ZOCOR) 40 MG tablet 90 tablet 3    Sig: Take 1 tablet (40 mg total) by mouth daily. for cholesterol.     Cardiovascular:  Antilipid - Statins Failed - 01/18/2022 11:24 AM      Failed - Lipid Panel in normal range within the last 12 months    Cholesterol, Total  Date Value Ref Range Status  11/09/2021 170 100 - 199 mg/dL Final   LDL Chol Calc (NIH)  Date Value Ref Range Status  11/09/2021 89 0 - 99 mg/dL Final   HDL  Date Value Ref Range Status  11/09/2021 67 >39 mg/dL Final   Triglycerides  Date Value Ref Range Status  11/09/2021 74 0 - 149 mg/dL Final         Passed - Patient is not pregnant      Passed - Valid encounter within last 12 months    Recent Outpatient Visits          2 months ago Annual physical exam   [facility] [facility] at [facility], Jesse Sans, MD   5 months ago Acute non-recurrent maxillary sinusitis   [place] [facility] at [facility], Jesse Sans, MD   8 months ago Community acquired pneumonia of right upper lobe of lung   [place] [facility] at [facility], Jesse Sans, MD   10 months ago Degenerative disc disease, lumbar   [place] [facility] at [facility], Jesse Sans, MD   1 year ago Annual physical exam   [facility] at [facility], Jesse Sans, MD      Future Appointments            In 62 months [employer] [facility] Jesse Sans, MD [place] [facility] at [facility], [facility]

## 2022-02-28 DIAGNOSIS — F331 Major depressive disorder, recurrent, moderate: Secondary | ICD-10-CM | POA: Diagnosis not present

## 2022-03-29 IMAGING — CR DG CHEST 2V
2 series · 2 of 2 positions shown · non-contrast
Comparison: None.

CLINICAL DATA: Cough, shortness of breath

EXAM:
CHEST - 2 VIEW

[chest pa]
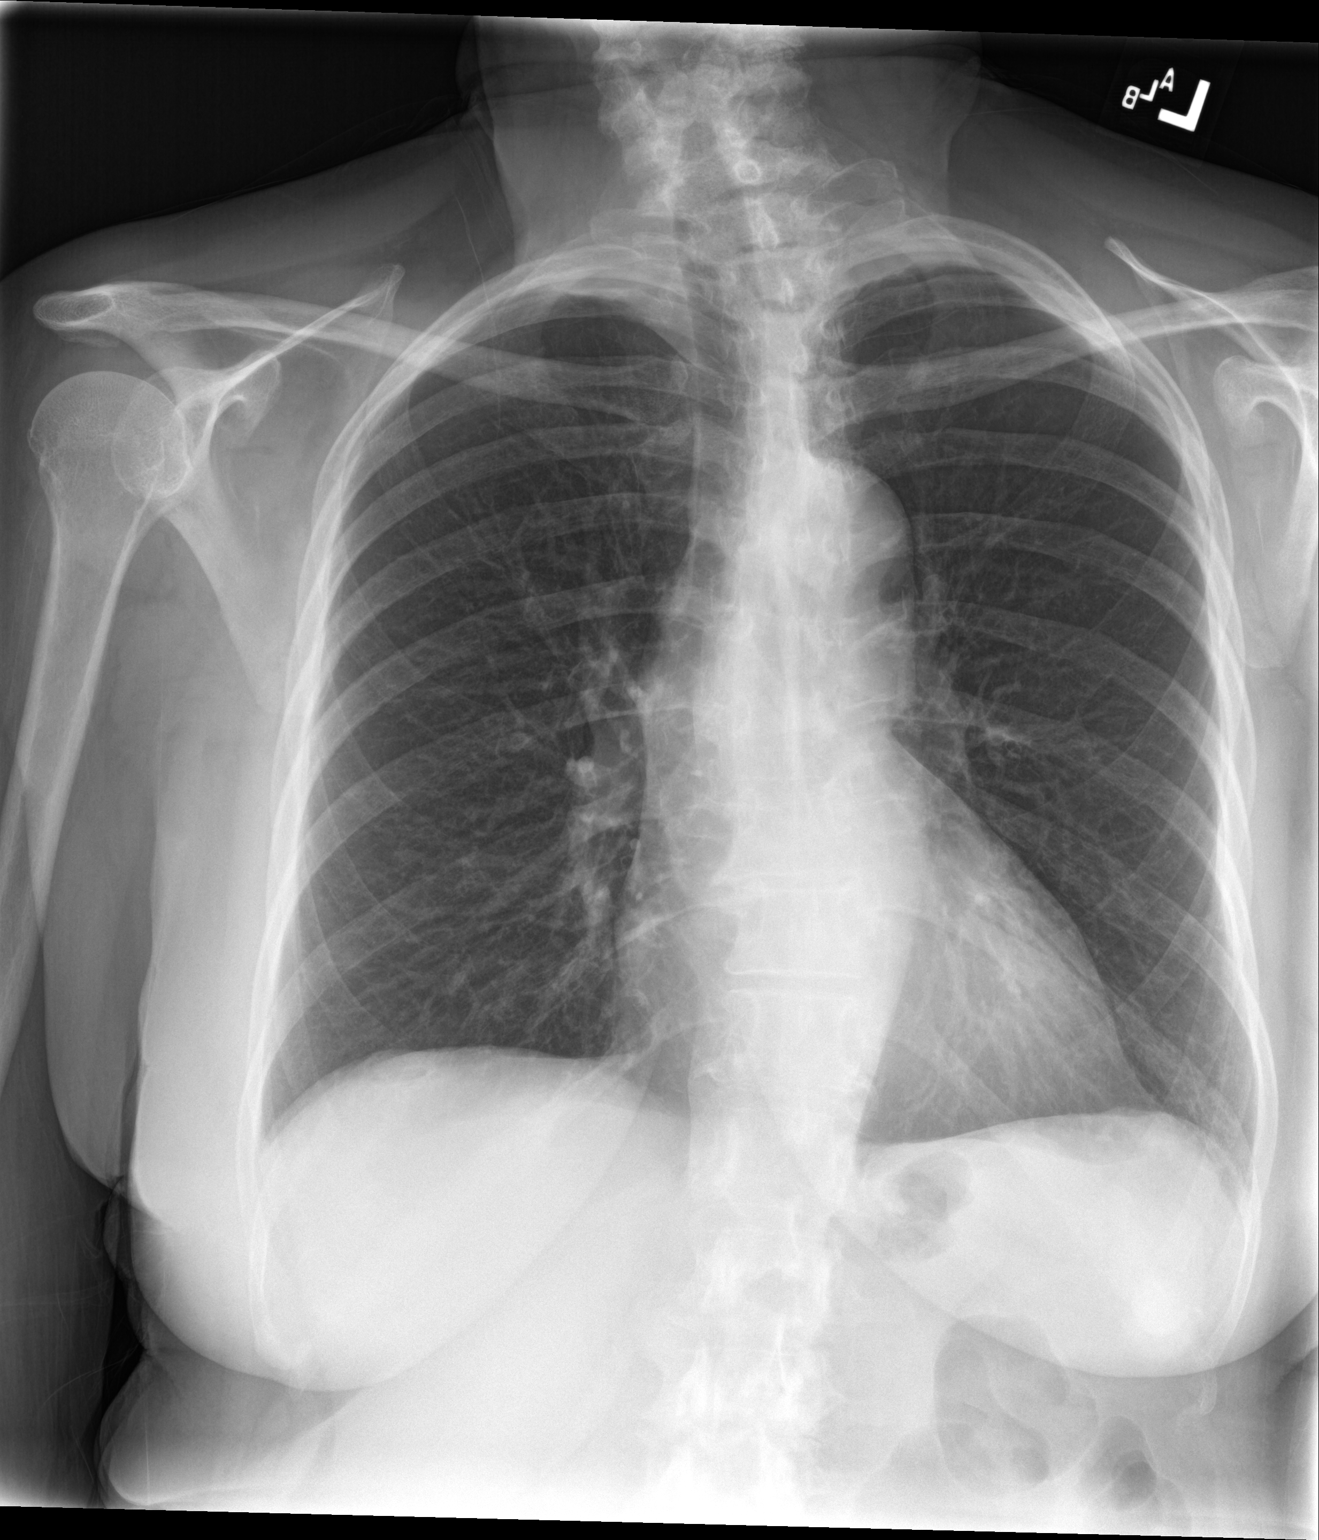

[chest lat]
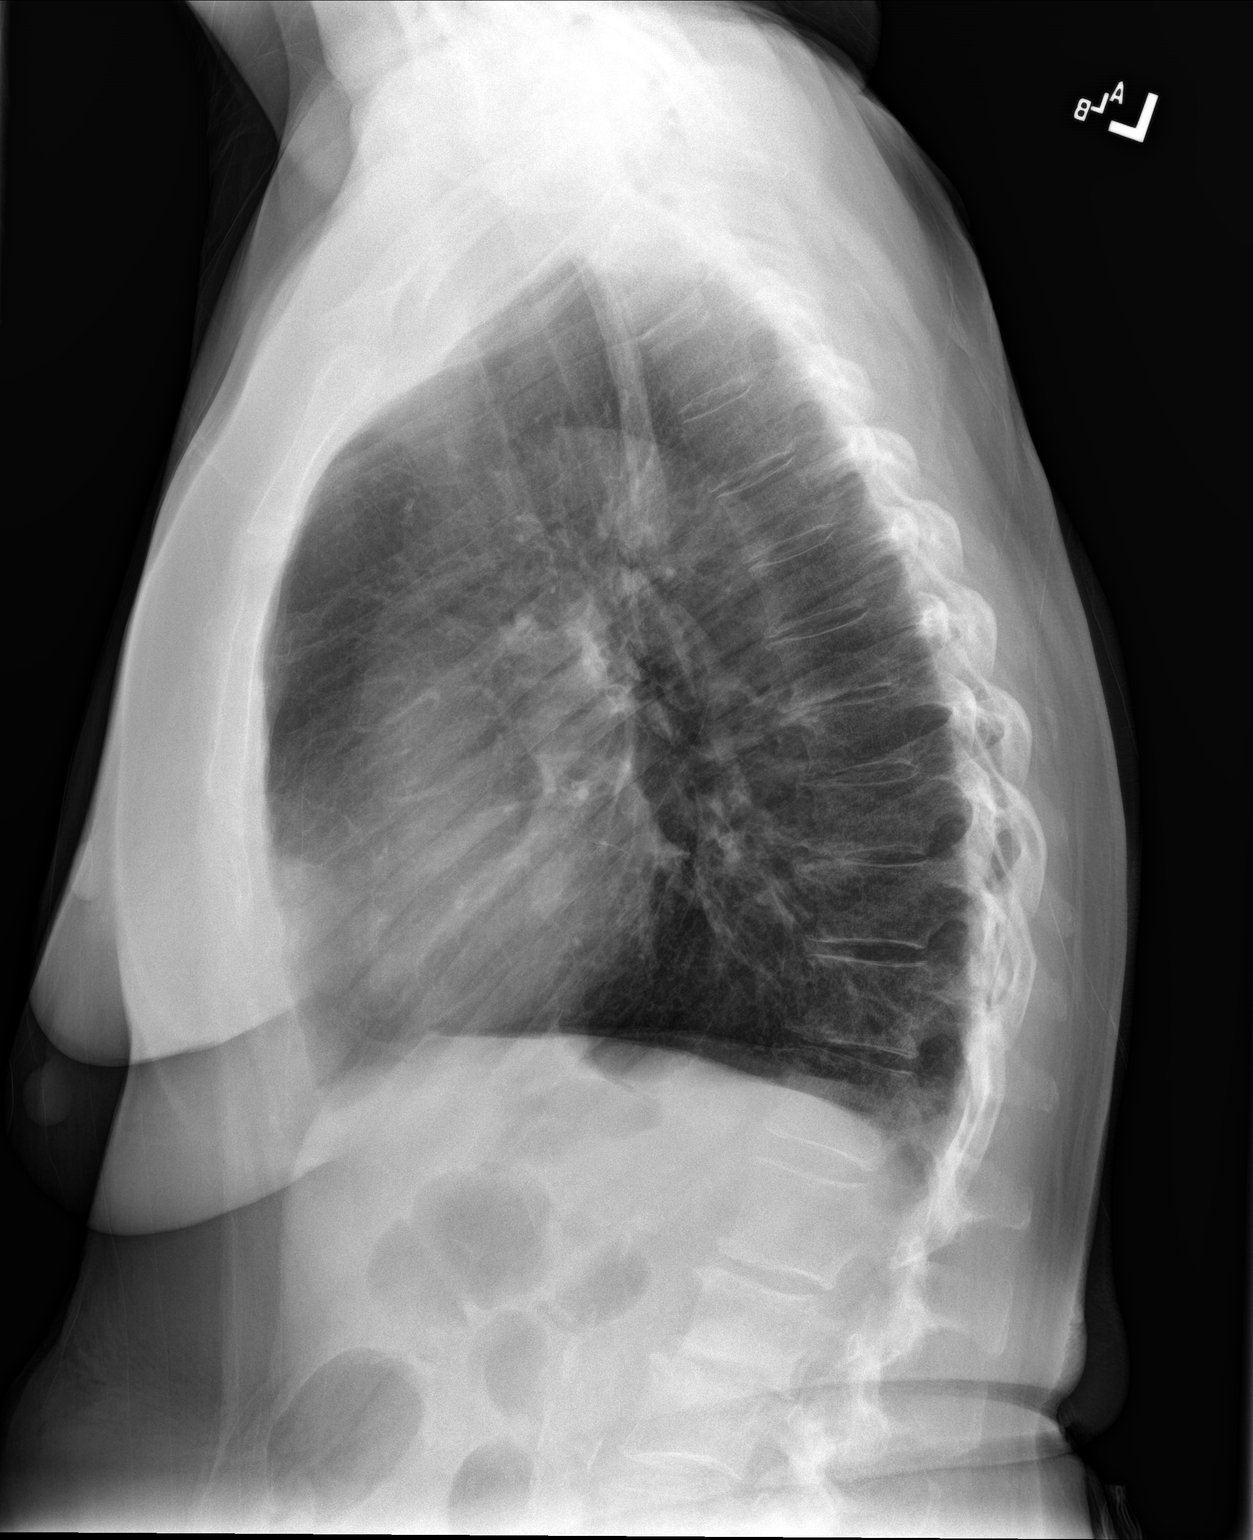

[2 of 2 positions shown; findings below may reference images not displayed]

FINDINGS: Heart size and mediastinal contours are within normal limits. Faint
opacities at the left lateral lung base.

No pleural effusion or pneumothorax visualized.

No acute osseous abnormality appreciated.
IMPRESSION: Faint opacities at the left lung base which could represent minor
subsegmental atelectasis, chronic change or early infiltrate.

## 2022-04-09 ENCOUNTER — Other Ambulatory Visit: Payer: Self-pay | Admitting: Internal Medicine

## 2022-05-13 ENCOUNTER — Other Ambulatory Visit: Payer: Self-pay | Admitting: Internal Medicine

## 2022-05-13 DIAGNOSIS — E782 Mixed hyperlipidemia: Secondary | ICD-10-CM

## 2022-05-23 ENCOUNTER — Ambulatory Visit (INDEPENDENT_AMBULATORY_CARE_PROVIDER_SITE_OTHER): Payer: Medicare Other | Admitting: Internal Medicine

## 2022-05-23 ENCOUNTER — Encounter: Payer: Self-pay | Admitting: Internal Medicine

## 2022-05-23 VITALS — BP 132/78 | HR 98 | Ht 61.0 in | Wt 144.0 lb

## 2022-05-23 DIAGNOSIS — N3001 Acute cystitis with hematuria: Secondary | ICD-10-CM | POA: Diagnosis not present

## 2022-05-23 DIAGNOSIS — F324 Major depressive disorder, single episode, in partial remission: Secondary | ICD-10-CM | POA: Diagnosis not present

## 2022-05-23 DIAGNOSIS — N1832 Chronic kidney disease, stage 3b: Secondary | ICD-10-CM | POA: Diagnosis not present

## 2022-05-23 LAB — POCT URINALYSIS DIPSTICK
Bilirubin, UA: NEGATIVE
Glucose, UA: NEGATIVE
Ketones, UA: NEGATIVE
Nitrite, UA: NEGATIVE
Protein, UA: NEGATIVE
Spec Grav, UA: 1.015 (ref 1.010–1.025)
Urobilinogen, UA: 0.2 E.U./dL
pH, UA: 6 (ref 5.0–8.0)

## 2022-05-23 MED ORDER — SULFAMETHOXAZOLE-TRIMETHOPRIM 800-160 MG PO TABS: 1.0000 | ORAL_TABLET | Freq: Two times a day (BID) | ORAL | 0 refills | Status: AC

## 2022-05-23 NOTE — Assessment & Plan Note (Signed)
Followed by Psych in [place] and Valium

## 2022-05-23 NOTE — Progress Notes (Signed)
Date:  05/23/2022   Name:  Jennifer Knapp   DOB:  October 11, 1940   MRN:  [id number]   Chief Complaint: Urinary Tract Infection (Discomfort/tingling when getting ready to finish urinating X1 week.)  Urinary Tract Infection  This is a new problem. The current episode started in the past 7 days. The problem occurs every urination. The problem has been unchanged. The quality of the pain is described as burning. The pain is mild. There has been no fever. Associated symptoms include frequency and urgency. Pertinent negatives include no chills, discharge, flank pain, hematuria, nausea or vomiting. She has tried increased fluids for the symptoms.  Depression        This is a chronic problem.The problem is unchanged.  Past treatments include SNRIs - Serotonin and norepinephrine reuptake inhibitors (followed by Psych).  Renal insuff - GFR has been stable around 50.  She continues to take Aleve 1 tab per day for various aches and pains. Lab Results  Component Value Date   NA 139 11/09/2021   K 4.9 11/09/2021   CO2 23 11/09/2021   GLUCOSE 90 11/09/2021   BUN 15 11/09/2021   CREATININE 1.08 (H) 11/09/2021   CALCIUM 9.8 11/09/2021   EGFR 51 (L) 11/09/2021   GFRNONAA 51 (L) 09/24/2019   Lab Results  Component Value Date   CHOL 170 11/09/2021   HDL 67 11/09/2021   LDLCALC 89 11/09/2021   TRIG 74 11/09/2021   CHOLHDL 2.5 11/09/2021   Lab Results  Component Value Date   TSH 4.490 11/09/2021   No results found for: "HGBA1C" Lab Results  Component Value Date   WBC 7.3 11/09/2021   HGB 13.3 11/09/2021   HCT 39.5 11/09/2021   MCV 91 11/09/2021   PLT 339 11/09/2021   Lab Results  Component Value Date   ALT 18 11/09/2021   AST 29 11/09/2021   ALKPHOS 38 (L) 11/09/2021   BILITOT 0.4 11/09/2021   No results found for: "25OHVITD2", "25OHVITD3", "VD25OH"   Review of Systems  Constitutional:  Negative for chills.  Respiratory:  Negative for cough, chest tightness and shortness of  breath.   Cardiovascular:  Negative for chest pain.  Gastrointestinal:  Negative for abdominal distention, nausea and vomiting.  Genitourinary:  Positive for frequency and urgency. Negative for flank pain and hematuria.  Psychiatric/Behavioral:  Positive for depression and dysphoric mood. Negative for sleep disturbance. The patient is nervous/anxious.     Patient Active Problem List   Diagnosis Date Noted   Stage 3b chronic kidney disease (CKD) ([place]) 11/09/2021   Degenerative disc disease, lumbar 03/05/2021   Gastroesophageal reflux disease without esophagitis 10/07/2020   Current use of estrogen therapy 10/07/2020   Primary osteoarthritis of right hip 08/26/2020   Benign essential tremor 09/24/2019   Primary osteoarthritis of left knee 09/24/2019   Pain of left hip joint 06/01/2018   Neuropathy of foot 12/23/2016   Major depressive disorder with single episode, in partial remission ([place]) 05/31/2016   Hyperlipidemia, mixed 10/22/2014   B12 nutritional deficiency 10/22/2014   Hot flash, menopausal 10/22/2014    No Known Allergies  Past Surgical History:  Procedure Laterality Date   ABDOMINAL HYSTERECTOMY     APPENDECTOMY     BREAST BIOPSY Left    x2-neg   CATARACT EXTRACTION W/PHACO Left 05/25/2015   Procedure: CATARACT EXTRACTION PHACO AND INTRAOCULAR LENS PLACEMENT ([place]);  Surgeon: Estill Cotta, MD;  Location: [facility] ORS;  Service: Ophthalmology;  Laterality: Left;  [place]: 01:25.9 AP%: 26.3  CDE: 37.92  Lot # [id number]   CATARACT EXTRACTION W/PHACO Right 07/20/2015   Procedure: CATARACT EXTRACTION PHACO AND INTRAOCULAR LENS PLACEMENT ([facility]);  Surgeon: Estill Cotta, MD;  Location: [facility] ORS;  Service: Ophthalmology;  Laterality: Right;  [place]      1:12.7 AP%    25.3 CDE    29.53 fluid casette lot # [id number]  exp 09/15/2016    Social History   Tobacco Use   Smoking status: Former    Packs/day: 0.50    Years: 25.00    Total pack years: 12.50    Types: Cigarettes    Quit  date: 1992    Years since quitting: 32.1   Smokeless tobacco: Never   Tobacco comments:    quit early 90's  Vaping Use   Vaping Use: Never used  Substance Use Topics   Alcohol use: Not Currently    Alcohol/week: 0.0 standard drinks of alcohol   Drug use: No     Medication list has been reviewed and updated.  Current Meds  Medication Sig   Acetaminophen (TYLENOL EXTRA STRENGTH PO) Take by mouth daily. Takes 2-4 a day   albuterol (VENTOLIN HFA) 108 (90 Base) MCG/ACT inhaler Inhale 2 puffs into the lungs every 6 (six) hours as needed for wheezing or shortness of breath.   desvenlafaxine (PRISTIQ) 50 MG 24 hr tablet Take 1 tablet (50 mg total) by mouth daily.   diazepam (VALIUM) 10 MG tablet Take 10 mg by mouth daily as needed. Half a tablet   estradiol (ESTRACE) 0.5 MG tablet TAKE ONE TABLET BY MOUTH ONCE DAILY   fenofibrate (TRICOR) 145 MG tablet TAKE 1 TABLET BY MOUTH ONCE DAILY.   naproxen sodium (ALEVE) 220 MG tablet Take 220 mg by mouth.   simvastatin (ZOCOR) 40 MG tablet Take 1 tablet (40 mg total) by mouth daily. for cholesterol.   sulfamethoxazole-trimethoprim (BACTRIM DS) 800-160 MG tablet Take 1 tablet by mouth 2 (two) times daily for 10 days.       05/23/2022    1:54 PM 11/09/2021    8:48 AM 08/19/2021   11:40 AM 05/19/2021   11:20 AM  GAD 7 : Generalized Anxiety Score  Nervous, Anxious, on Edge 0 0 0 0  Control/stop worrying 0 0 0 0  Worry too much - different things 0 0 0 0  Trouble relaxing 0 0 0 0  Restless 0 0 0 0  Easily annoyed or irritable 0 0 0 0  Afraid - awful might happen 0 0 0 0  Total GAD 7 Score 0 0 0 0  Anxiety Difficulty Not difficult at all Not difficult at all Not difficult at all        05/23/2022    1:54 PM 11/09/2021    8:48 AM 10/13/2021    1:37 PM  Depression screen PHQ 2/9  Decreased Interest 1 0 0  Down, Depressed, Hopeless 0 0 0  PHQ - 2 Score 1 0 0  Altered sleeping 0 0 0  Tired, decreased energy 2 1 2   Change in appetite 1 0 0   Feeling bad or failure about yourself  0 0 0  Trouble concentrating 1 0 0  Moving slowly or fidgety/restless 0 0 0  Suicidal thoughts 0 0 0  PHQ-9 Score 5 1 2   Difficult doing work/chores Somewhat difficult Not difficult at all Not difficult at all    BP Readings from Last 3 Encounters:  05/23/22 132/78  11/09/21 135/78  08/19/21 126/70  Physical Exam Vitals and nursing note reviewed.  Constitutional:      Appearance: She is well-developed.  Cardiovascular:     Rate and Rhythm: Normal rate and regular rhythm.     Heart sounds: Normal heart sounds.  Pulmonary:     Effort: Pulmonary effort is normal. No respiratory distress.     Breath sounds: Normal breath sounds.  Abdominal:     General: Bowel sounds are normal.     Palpations: Abdomen is soft.     Tenderness: There is abdominal tenderness in the suprapubic area. There is no right CVA tenderness, left CVA tenderness, guarding or rebound.  Musculoskeletal:     Cervical back: Normal range of motion.     Wt Readings from Last 3 Encounters:  05/23/22 144 lb (65.3 kg)  11/09/21 141 lb (64 kg)  08/19/21 141 lb 3.2 oz (64 kg)    BP 132/78 (BP Location: Left Arm, Cuff Size: Normal)   Pulse 98   Ht 5\' 1"  (1.549 m)   Wt 144 lb (65.3 kg)   SpO2 98%   BMI 27.21 kg/m   Assessment and Plan: Problem List Items Addressed This Visit       Genitourinary   Stage 3b chronic kidney disease (CKD) ([place]) (Chronic)    Being monitored Limiting NSAIDS        Other   Major depressive disorder with single episode, in partial remission ([place]) (Chronic)    Followed by Psych in [place] and [place]      Other Visit Diagnoses     Acute cystitis with hematuria    -  Primary   push fluids start Bactrim and adjust antibiotics if needed per urine cx   Relevant Medications   sulfamethoxazole-trimethoprim (BACTRIM DS) 800-160 MG tablet   Other Relevant Orders   POCT Urinalysis Dipstick   Urine Culture         Partially dictated using Dragon software. Any errors are unintentional.  Halina Maidens, MD [facility]  05/23/2022

## 2022-05-23 NOTE — Assessment & Plan Note (Signed)
Being monitored Limiting NSAIDS

## 2022-05-25 LAB — URINE CULTURE

## 2022-05-31 ENCOUNTER — Encounter: Payer: Self-pay | Admitting: Internal Medicine

## 2022-08-09 ENCOUNTER — Ambulatory Visit: Payer: Medicare Other | Admitting: Internal Medicine

## 2022-08-10 ENCOUNTER — Ambulatory Visit: Payer: Medicare Other | Admitting: Internal Medicine

## 2022-08-29 DIAGNOSIS — F419 Anxiety disorder, unspecified: Secondary | ICD-10-CM | POA: Diagnosis not present

## 2022-08-29 DIAGNOSIS — F331 Major depressive disorder, recurrent, moderate: Secondary | ICD-10-CM | POA: Diagnosis not present

## 2022-10-27 ENCOUNTER — Ambulatory Visit: Payer: Medicare Other

## 2022-10-27 VITALS — Ht 61.0 in | Wt 144.0 lb

## 2022-10-27 DIAGNOSIS — Z0000 Encounter for general adult medical examination without abnormal findings: Secondary | ICD-10-CM | POA: Diagnosis not present

## 2022-10-27 NOTE — Patient Instructions (Signed)
[name] , Thank you for taking time to come for your Medicare Wellness Visit. I appreciate your ongoing commitment to your health goals. Please review the following plan we discussed and let me know if I can assist you in the future.   These are the goals we discussed:  Goals      DIET - EAT MORE FRUITS AND VEGETABLES     DIET - INCREASE WATER INTAKE     Recommend drinking 6-8 glasses of water per day        This is a list of the screening recommended for you and due dates:  Health Maintenance  Topic Date Due   Zoster (Shingles) Vaccine (1 of 2) Never done   DEXA scan (bone density measurement)  Never done   DTaP/Tdap/Td vaccine (2 - Td or Tdap) 01/13/2021   COVID-19 Vaccine (8 - 2023-24 season) 12/17/2021   Flu Shot  11/17/2022   Medicare Annual Wellness Visit  10/27/2023   Pneumonia Vaccine  Completed   HPV Vaccine  Aged Out    Advanced directives: no  Conditions/risks identified: none  Next appointment: Follow up in one year for your annual wellness visit 11/01/23 @ 3:00 pm by phone   Preventive Care 65 Years and Older, Female Preventive care refers to lifestyle choices and visits with your health care provider that can promote health and wellness. What does preventive care include? A yearly physical exam. This is also called an annual well check. Dental exams once or twice a year. Routine eye exams. Ask your health care provider how often you should have your eyes checked. Personal lifestyle choices, including: Daily care of your teeth and gums. Regular physical activity. Eating a healthy diet. Avoiding tobacco and drug use. Limiting alcohol use. Practicing safe sex. Taking low-dose aspirin every day. Taking vitamin and mineral supplements as recommended by your health care provider. What happens during an annual well check? The services and screenings done by your health care provider during your annual well check will depend on your age, overall health,  lifestyle risk factors, and family history of disease. Counseling  Your health care provider may ask you questions about your: Alcohol use. Tobacco use. Drug use. Emotional well-being. Home and relationship well-being. Sexual activity. Eating habits. History of falls. Memory and ability to understand (cognition). Work and work Astronomer. Reproductive health. Screening  You may have the following tests or measurements: Height, weight, and BMI. Blood pressure. Lipid and cholesterol levels. These may be checked every 5 years, or more frequently if you are over 35 years old. Skin check. Lung cancer screening. You may have this screening every year starting at age 74 if you have a 30-pack-year history of smoking and currently smoke or have quit within the past 15 years. Fecal occult blood test (FOBT) of the stool. You may have this test every year starting at age 61. Flexible sigmoidoscopy or colonoscopy. You may have a sigmoidoscopy every 5 years or a colonoscopy every 10 years starting at age 21. Hepatitis C blood test. Hepatitis B blood test. Sexually transmitted disease (STD) testing. Diabetes screening. This is done by checking your blood sugar (glucose) after you have not eaten for a while (fasting). You may have this done every 1-3 years. Bone density scan. This is done to screen for osteoporosis. You may have this done starting at age 60. Mammogram. This may be done every 1-2 years. Talk to your health care provider about how often you should have regular mammograms. Talk with  your health care provider about your test results, treatment options, and if necessary, the need for more tests. Vaccines  Your health care provider may recommend certain vaccines, such as: Influenza vaccine. This is recommended every year. Tetanus, diphtheria, and acellular pertussis (Tdap, Td) vaccine. You may need a Td booster every 10 years. Zoster vaccine. You may need this after age  34. Pneumococcal 13-valent conjugate (PCV13) vaccine. One dose is recommended after age 54. Pneumococcal polysaccharide (PPSV23) vaccine. One dose is recommended after age 34. Talk to your health care provider about which screenings and vaccines you need and how often you need them. This information is not intended to replace advice given to you by your health care provider. Make sure you discuss any questions you have with your health care provider. Document Released: 05/01/2015 Document Revised: 12/23/2015 Document Reviewed: 02/03/2015 Elsevier Interactive Patient Education  2017 [employer].  Fall Prevention in the Home Falls can cause injuries. They can happen to people of all ages. There are many things you can do to make your home safe and to help prevent falls. What can I do on the outside of my home? Regularly fix the edges of walkways and driveways and fix any cracks. Remove anything that might make you trip as you walk through a door, such as a raised step or threshold. Trim any bushes or trees on the path to your home. Use bright outdoor lighting. Clear any walking paths of anything that might make someone trip, such as rocks or tools. Regularly check to see if handrails are loose or broken. Make sure that both sides of any steps have handrails. Any raised decks and porches should have guardrails on the edges. Have any leaves, snow, or ice cleared regularly. Use sand or salt on walking paths during winter. Clean up any spills in your garage right away. This includes oil or grease spills. What can I do in the bathroom? Use night lights. Install grab bars by the toilet and in the tub and shower. Do not use towel bars as grab bars. Use non-skid mats or decals in the tub or shower. If you need to sit down in the shower, use a plastic, non-slip stool. Keep the floor dry. Clean up any water that spills on the floor as soon as it happens. Remove soap buildup in the tub or shower  regularly. Attach bath mats securely with double-sided non-slip rug tape. Do not have throw rugs and other things on the floor that can make you trip. What can I do in the bedroom? Use night lights. Make sure that you have a light by your bed that is easy to reach. Do not use any sheets or blankets that are too big for your bed. They should not hang down onto the floor. Have a firm chair that has side arms. You can use this for support while you get dressed. Do not have throw rugs and other things on the floor that can make you trip. What can I do in the kitchen? Clean up any spills right away. Avoid walking on wet floors. Keep items that you use a lot in easy-to-reach places. If you need to reach something above you, use a strong step stool that has a grab bar. Keep electrical cords out of the way. Do not use floor polish or wax that makes floors slippery. If you must use wax, use non-skid floor wax. Do not have throw rugs and other things on the floor that can make you trip.  What can I do with my stairs? Do not leave any items on the stairs. Make sure that there are handrails on both sides of the stairs and use them. Fix handrails that are broken or loose. Make sure that handrails are as long as the stairways. Check any carpeting to make sure that it is firmly attached to the stairs. Fix any carpet that is loose or worn. Avoid having throw rugs at the top or bottom of the stairs. If you do have throw rugs, attach them to the floor with carpet tape. Make sure that you have a light switch at the top of the stairs and the bottom of the stairs. If you do not have them, ask someone to add them for you. What else can I do to help prevent falls? Wear shoes that: Do not have high heels. Have rubber bottoms. Are comfortable and fit you well. Are closed at the toe. Do not wear sandals. If you use a stepladder: Make sure that it is fully opened. Do not climb a closed stepladder. Make sure that  both sides of the stepladder are locked into place. Ask someone to hold it for you, if possible. Clearly mark and make sure that you can see: Any grab bars or handrails. First and last steps. Where the edge of each step is. Use tools that help you move around (mobility aids) if they are needed. These include: Canes. Walkers. Scooters. Crutches. Turn on the lights when you go into a dark area. Replace any light bulbs as soon as they burn out. Set up your furniture so you have a clear path. Avoid moving your furniture around. If any of your floors are uneven, fix them. If there are any pets around you, be aware of where they are. Review your medicines with your doctor. Some medicines can make you feel dizzy. This can increase your chance of falling. Ask your doctor what other things that you can do to help prevent falls. This information is not intended to replace advice given to you by your health care provider. Make sure you discuss any questions you have with your health care provider. Document Released: 01/29/2009 Document Revised: 09/10/2015 Document Reviewed: 05/09/2014 Elsevier Interactive Patient Education  2017 [employer].

## 2022-10-27 NOTE — Progress Notes (Signed)
Subjective:   Jennifer Knapp is a 82 y.o. female who presents for Medicare Annual (Subsequent) preventive examination.  Visit Complete: Virtual  I connected with  [name] on 10/27/22 by a audio enabled telemedicine application and verified that I am speaking with the correct person using two identifiers.  Patient Location: Home  Provider Location: Office/Clinic  I discussed the limitations of evaluation and management by telemedicine. The patient expressed understanding and agreed to proceed.   Review of Systems     Cardiac Risk Factors include: advanced age (>47men, >47 women);dyslipidemia;sedentary lifestyle     Objective:    Today's Vitals   10/27/22 1142  Weight: 144 lb (65.3 kg)  Height: 5\' 1"  (1.549 m)   Body mass index is 27.21 kg/m.     10/27/2022   11:31 AM 10/13/2021    1:38 PM 10/12/2020    1:31 PM 10/07/2019    1:31 PM 09/24/2018    2:57 PM 05/31/2016   10:50 AM 07/20/2015    8:35 AM  Advanced Directives  Does Patient Have a Medical Advance Directive? No Yes Yes No No No No  Type of Special educational needs teacher of [place];Living will Healthcare Power of [place];Living will      Copy of Healthcare Power of Attorney in Chart?  No - copy requested No - copy requested      Would patient like information on creating a medical advance directive? No - Patient declined   Yes (MAU/Ambulatory/Procedural Areas - Information given) No - Patient declined  No - patient declined information    Current Medications (verified) Outpatient Encounter Medications as of 10/27/2022  Medication Sig   Acetaminophen (TYLENOL EXTRA STRENGTH PO) Take by mouth daily. Takes 2-4 a day   albuterol (VENTOLIN HFA) 108 (90 Base) MCG/ACT inhaler Inhale 2 puffs into the lungs every 6 (six) hours as needed for wheezing or shortness of breath.   desvenlafaxine (PRISTIQ) 50 MG 24 hr tablet Take 1 tablet (50 mg total) by mouth daily.   diazepam (VALIUM) 10 MG tablet Take 10 mg  by mouth daily as needed. Half a tablet   estradiol (ESTRACE) 0.5 MG tablet TAKE ONE TABLET BY MOUTH ONCE DAILY   fenofibrate (TRICOR) 145 MG tablet TAKE 1 TABLET BY MOUTH ONCE DAILY.   simvastatin (ZOCOR) 40 MG tablet Take 1 tablet (40 mg total) by mouth daily. for cholesterol.   naproxen sodium (ALEVE) 220 MG tablet Take 220 mg by mouth. (Patient not taking: Reported on 10/27/2022)   No facility-administered encounter medications on file as of 10/27/2022.    Allergies (verified) Patient has no known allergies.   History: Past Medical History:  Diagnosis Date   AB (asthmatic bronchitis) 10/22/2014   Anxiety    Arthritis    Asthma    Bronchitis    CHRONIC   Depression    Elevated cholesterol    HLD (hyperlipidemia)    Past Surgical History:  Procedure Laterality Date   ABDOMINAL HYSTERECTOMY     APPENDECTOMY     BREAST BIOPSY Left    x2-neg   CATARACT EXTRACTION W/PHACO Left 05/25/2015   Procedure: CATARACT EXTRACTION PHACO AND INTRAOCULAR LENS PLACEMENT (IOC);  Surgeon: Sallee Lange, MD;  Location: [facility] ORS;  Service: Ophthalmology;  Laterality: Left;  [place]: 01:25.9 AP%: 26.3 CDE: 37.92  Lot # [id number]   CATARACT EXTRACTION W/PHACO Right 07/20/2015   Procedure: CATARACT EXTRACTION PHACO AND INTRAOCULAR LENS PLACEMENT (IOC);  Surgeon: Sallee Lange, MD;  Location: [facility] ORS;  Service: Ophthalmology;  Laterality: Right;  [place]      1:12.7 AP%    25.3 CDE    29.53 fluid casette lot # [id number]  exp 09/15/2016   Family History  Problem Relation Age of Onset   Cancer Mother        colon   Hematuria Mother    Urinary tract infection Mother    Depression Father    Breast cancer Neg Hx    Kidney cancer Neg Hx    Bladder Cancer Neg Hx    Social History   Socioeconomic History   Marital status: Married    Spouse name: Not on file   Number of children: 1   Years of education: Not on file   Highest education level: 10th grade  Occupational History   Not on file  Tobacco  Use   Smoking status: Former    Current packs/day: 0.00    Average packs/day: 0.5 packs/day for 25.0 years (12.5 ttl pk-yrs)    Types: Cigarettes    Start date: 44    Quit date: 70    Years since quitting: 32.5   Smokeless tobacco: Never   Tobacco comments:    quit early 90's  Vaping Use   Vaping status: Never Used  Substance and Sexual Activity   Alcohol use: Not Currently    Alcohol/week: 0.0 standard drinks of alcohol   Drug use: No   Sexual activity: Not Currently  Other Topics Concern   Not on file  Social History Narrative   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   Social Determinants of Health   Financial Resource Strain: Low Risk  (10/27/2022)   Overall Financial Resource Strain (CARDIA)    Difficulty of Paying Living Expenses: Not hard at all  Food Insecurity: No Food Insecurity (10/27/2022)   Hunger Vital Sign    Worried About Running Out of Food in the Last Year: Never true    Ran Out of Food in the Last Year: Never true  Transportation Needs: No Transportation Needs (10/27/2022)   PRAPARE - Administrator, Civil Service (Medical): No    Lack of Transportation (Non-Medical): No  Physical Activity: Inactive (10/27/2022)   Exercise Vital Sign    Days of Exercise per Week: 0 days    Minutes of Exercise per Session: 0 min  Stress: No Stress Concern Present (10/27/2022)   [employer] of Occupational Health - Occupational Stress Questionnaire    Feeling of Stress : Not at all  Social Connections: Moderately Integrated (10/27/2022)   Social Connection and Isolation Panel [NHANES]    Frequency of Communication with Friends and Family: More than three times a week    Frequency of Social Gatherings with Friends and Family: Once a week    Attends Religious Services: More than 4 times per year    Active Member of [employer] or Organizations: No    Attends Engineer, structural: Never    Marital Status: Married     Tobacco Counseling Counseling given: Not Answered Tobacco comments: quit early 90's   Clinical Intake:  Pre-visit preparation completed: Yes  Pain : No/denies pain     Nutritional Risks: None Diabetes: No  How often do you need to have someone help you when you read instructions, pamphlets, or other written materials from your doctor or pharmacy?: 1 - Never  Interpreter Needed?: No  Information entered by :: Kennedy Bucker, LPN   Activities of  Daily Living    10/27/2022   11:33 AM  In your present state of health, do you have any difficulty performing the following activities:  Hearing? 0  Vision? 0  Difficulty concentrating or making decisions? 0  Walking or climbing stairs? 1  Dressing or bathing? 0  Doing errands, shopping? 0  Preparing Food and eating ? N  Using the Toilet? N  In the past six months, have you accidently leaked urine? N  Do you have problems with loss of bowel control? N  Managing your Medications? N  Managing your Finances? N  Housekeeping or managing your Housekeeping? N    Patient Care Team: Reubin Milan, MD as PCP - General (Internal Medicine) Jacquelin Hawking, MD as Referring Physician (Psychiatry)  Indicate any recent Medical Services you may have received from other than [facility] providers in the past year (date may be approximate).     Assessment:   This is a routine wellness examination for Jennifer Knapp.  Hearing/Vision screen Hearing Screening - Comments:: No aids Vision Screening - Comments:: Wears glasses- MD in [place]  Dietary issues and exercise activities discussed:     Goals Addressed             This Visit's Progress    DIET - EAT MORE FRUITS AND VEGETABLES         Depression Screen    10/27/2022   11:30 AM 05/23/2022    1:54 PM 11/09/2021    8:48 AM 10/13/2021    1:37 PM 08/19/2021   11:40 AM 05/19/2021   11:20 AM 03/05/2021   11:15 AM  PHQ 2/9 Scores  PHQ - 2 Score 0 1 0 0 1 1 0  PHQ- 9 Score 0 5 1 2 3 3 1      Fall Risk    10/27/2022   11:32 AM 05/23/2022    1:55 PM 11/09/2021    8:48 AM 10/13/2021    1:39 PM 08/19/2021   11:40 AM  Fall Risk   Falls in the past year? 1 0 1 1 1   Number falls in past yr: 1 0 0 0 0  Injury with Fall? 0 0 0 0 0  Risk for fall due to : History of fall(s) No Fall Risks History of fall(s) History of fall(s) No Fall Risks  Follow up Falls evaluation completed;Falls prevention discussed Falls evaluation completed Falls evaluation completed Falls prevention discussed Falls evaluation completed    MEDICARE RISK AT HOME:  Medicare Risk at Home - 10/27/22 1133     Any stairs in or around the home? Yes    If so, are there any without handrails? No    Home free of loose throw rugs in walkways, pet beds, electrical cords, etc? Yes    Adequate lighting in your home to reduce risk of falls? Yes    Life alert? No    Use of a cane, walker or w/c? No    Grab bars in the bathroom? No    Shower chair or bench in shower? No    Elevated toilet seat or a handicapped toilet? No             TIMED UP AND GO:  Was the test performed?  No    Cognitive Function:        10/27/2022   11:36 AM 10/07/2019    1:33 PM 06/14/2017   10:29 AM 05/31/2016   10:58 AM  6CIT Screen  What Year? 0 points 0 points 0 points  0 points  What month? 0 points 0 points 0 points 0 points  What time? 0 points 0 points 0 points 0 points  Count back from 20 0 points 0 points 0 points 0 points  Months in reverse  0 points 0 points 0 points  Repeat phrase 2 points 0 points 0 points 0 points  Total Score  0 points 0 points 0 points    Immunizations Immunization History  Administered Date(s) Administered   Fluad Quad(high Dose 65+) 01/29/2020   Influenza,inj,Quad PF,6+ Mos 12/23/2016   Influenza,inj,quad, With Preservative 02/06/2019   Influenza-Unspecified 01/17/2020, 01/18/2021, 02/13/2022   Moderna Sars-Covid-2 Vaccination 05/03/2019, 05/31/2019, 02/26/2020   PNEUMOCOCCAL CONJUGATE-20  11/09/2021   Pneumococcal Conjugate-13 05/31/2016   Pneumococcal-Unspecified 04/18/2004   Tdap 01/14/2011   Unspecified SARS-COV-2 Vaccination 05/03/2019, 05/31/2019, 02/26/2020, 04/22/2020    TDAP status: Due, Education has been provided regarding the importance of this vaccine. Advised may receive this vaccine at local pharmacy or Health Dept. Aware to provide a copy of the vaccination record if obtained from local pharmacy or Health Dept. Verbalized acceptance and understanding.  Flu Vaccine status: Up to date  Pneumococcal vaccine status: Up to date  Covid-19 vaccine status: Completed vaccines  Qualifies for Shingles Vaccine? Yes   Zostavax completed No   Shingrix Completed?: No.    Education has been provided regarding the importance of this vaccine. Patient has been advised to call insurance company to determine out of pocket expense if they have not yet received this vaccine. Advised may also receive vaccine at local pharmacy or Health Dept. Verbalized acceptance and understanding.  Screening Tests Health Maintenance  Topic Date Due   Zoster Vaccines- Shingrix (1 of 2) Never done   DEXA SCAN  Never done   DTaP/Tdap/Td (2 - Td or Tdap) 01/13/2021   COVID-19 Vaccine (8 - 2023-24 season) 12/17/2021   INFLUENZA VACCINE  11/17/2022   Medicare Annual Wellness (AWV)  10/27/2023   Pneumonia Vaccine 59+ Years old  Completed   HPV VACCINES  Aged Out    Health Maintenance  Health Maintenance Due  Topic Date Due   Zoster Vaccines- Shingrix (1 of 2) Never done   DEXA SCAN  Never done   DTaP/Tdap/Td (2 - Td or Tdap) 01/13/2021   COVID-19 Vaccine (8 - 2023-24 season) 12/17/2021    Colorectal cancer screening: No longer required.   Mammogram status: No longer required due to age.  Declined referral for BDS  Lung Cancer Screening: (Low Dose CT Chest recommended if Age 75-80 years, 20 pack-year currently smoking OR have quit w/in 15years.) does not qualify.    Additional  Screening:  Hepatitis C Screening: does not qualify; Completed no  Vision Screening: Recommended annual ophthalmology exams for early detection of glaucoma and other disorders of the eye. Is the patient up to date with their annual eye exam?  Yes  Who is the provider or what is the name of the office in which the patient attends annual eye exams? [facility] in [place] If pt is not established with a provider, would they like to be referred to a provider to establish care? No .   Dental Screening: Recommended annual dental exams for proper oral hygiene   Community Resource Referral / Chronic Care Management: CRR required this visit?  No   CCM required this visit?  No     Plan:     I have personally reviewed and noted the following in the patient's chart:   Medical and social history Use  of alcohol, tobacco or illicit drugs  Current medications and supplements including opioid prescriptions. Patient is not currently taking opioid prescriptions. Functional ability and status Nutritional status Physical activity Advanced directives List of other physicians Hospitalizations, surgeries, and ER visits in previous 12 months Vitals Screenings to include cognitive, depression, and falls Referrals and appointments  In addition, I have reviewed and discussed with patient certain preventive protocols, quality metrics, and best practice recommendations. A written personalized care plan for preventive services as well as general preventive health recommendations were provided to patient.     Hal Hope, LPN   [id number]   After Visit Summary: (MyChart) Due to this being a telephonic visit, the after visit summary with patients personalized plan was offered to patient via MyChart   Nurse Notes: none

## 2022-11-21 ENCOUNTER — Encounter: Payer: Self-pay | Admitting: Internal Medicine

## 2022-11-21 ENCOUNTER — Ambulatory Visit (INDEPENDENT_AMBULATORY_CARE_PROVIDER_SITE_OTHER): Payer: Medicare Other | Admitting: Internal Medicine

## 2022-11-21 VITALS — BP 118/66 | HR 87 | Ht 61.0 in | Wt 145.2 lb

## 2022-11-21 DIAGNOSIS — Z0000 Encounter for general adult medical examination without abnormal findings: Secondary | ICD-10-CM

## 2022-11-21 DIAGNOSIS — N951 Menopausal and female climacteric states: Secondary | ICD-10-CM | POA: Diagnosis not present

## 2022-11-21 DIAGNOSIS — F324 Major depressive disorder, single episode, in partial remission: Secondary | ICD-10-CM

## 2022-11-21 DIAGNOSIS — N1832 Chronic kidney disease, stage 3b: Secondary | ICD-10-CM | POA: Diagnosis not present

## 2022-11-21 DIAGNOSIS — M1712 Unilateral primary osteoarthritis, left knee: Secondary | ICD-10-CM

## 2022-11-21 DIAGNOSIS — E538 Deficiency of other specified B group vitamins: Secondary | ICD-10-CM | POA: Diagnosis not present

## 2022-11-21 DIAGNOSIS — E782 Mixed hyperlipidemia: Secondary | ICD-10-CM | POA: Diagnosis not present

## 2022-11-21 MED ORDER — CYANOCOBALAMIN 1000 MCG/ML IJ SOLN
1000.0000 ug | Freq: Once | INTRAMUSCULAR | Status: AC
Start: 2022-11-21 — End: 2022-11-21
  Administered 2022-11-21: 1000 ug via INTRAMUSCULAR

## 2022-11-21 NOTE — Assessment & Plan Note (Signed)
Stable.  GFR > 50

## 2022-11-21 NOTE — Assessment & Plan Note (Addendum)
Has been on B12 injections in the past. Not currently taking any supplements. Will give IM injection today and check level

## 2022-11-21 NOTE — Assessment & Plan Note (Signed)
No longer taking Aleve due to renal issues Recently tried a supplement which may be helping - collagen?

## 2022-11-21 NOTE — Assessment & Plan Note (Signed)
Maintained on low dose estrogen to control severe symptoms

## 2022-11-21 NOTE — Assessment & Plan Note (Signed)
LDL is  Lab Results  Component Value Date   LDLCALC 89 11/09/2021   Currently being treated with statin and fenofibrate with good compliance and no concerns.

## 2022-11-21 NOTE — Assessment & Plan Note (Signed)
Being followed by psych Clinically stable on current regimen with good control of symptoms, No SI or HI. No change in management at this time. On Pristiq 50 mg and Valium prn

## 2022-11-21 NOTE — Progress Notes (Signed)
Date:  11/21/2022   Name:  Jennifer Knapp   DOB:  30-Oct-1940   MRN:  [id number]   Chief Complaint: Annual Exam Jennifer Knapp is a 82 y.o. female who presents today for her Complete Annual Exam. She feels well. She reports exercising - none. She reports she is sleeping well. Breast complaints - none.  She continues to do her housework and visits her husband in SNF every other day.  Mammogram: 2017 aged out/preference of pt DEXA: none Colonoscopy: aged out  Health Maintenance Due  Topic Date Due   Zoster Vaccines- Shingrix (1 of 2) Never done   DEXA SCAN  Never done   DTaP/Tdap/Td (2 - Td or Tdap) 01/13/2021   COVID-19 Vaccine (8 - 2023-24 season) 12/17/2021   INFLUENZA VACCINE  11/17/2022    Immunization History  Administered Date(s) Administered   Fluad Quad(high Dose 65+) 01/29/2020   Influenza,inj,Quad PF,6+ Mos 12/23/2016   Influenza,inj,quad, With Preservative 02/06/2019   Influenza-Unspecified 01/17/2020, 01/18/2021, 02/13/2022   Moderna Sars-Covid-2 Vaccination 05/03/2019, 05/31/2019, 02/26/2020   PNEUMOCOCCAL CONJUGATE-20 11/09/2021   Pneumococcal Conjugate-13 05/31/2016   Pneumococcal-Unspecified 04/18/2004   Tdap 01/14/2011   Unspecified SARS-COV-2 Vaccination 05/03/2019, 05/31/2019, 02/26/2020, 04/22/2020    Depression        This is a chronic problem.The problem is unchanged.  Associated symptoms include no fatigue and no headaches.  Past treatments include SNRIs - Serotonin and norepinephrine reuptake inhibitors. Hyperlipidemia This is a chronic problem. The problem is controlled. Pertinent negatives include no chest pain or shortness of breath. Current antihyperlipidemic treatment includes statins and fibric acid derivatives. The current treatment provides significant improvement of lipids.    Lab Results  Component Value Date   NA 139 11/09/2021   K 4.9 11/09/2021   CO2 23 11/09/2021   GLUCOSE 90 11/09/2021   BUN 15 11/09/2021   CREATININE 1.08  (H) 11/09/2021   CALCIUM 9.8 11/09/2021   EGFR 51 (L) 11/09/2021   GFRNONAA 51 (L) 09/24/2019   Lab Results  Component Value Date   CHOL 170 11/09/2021   HDL 67 11/09/2021   LDLCALC 89 11/09/2021   TRIG 74 11/09/2021   CHOLHDL 2.5 11/09/2021   Lab Results  Component Value Date   TSH 4.490 11/09/2021   No results found for: "HGBA1C" Lab Results  Component Value Date   WBC 7.3 11/09/2021   HGB 13.3 11/09/2021   HCT 39.5 11/09/2021   MCV 91 11/09/2021   PLT 339 11/09/2021   Lab Results  Component Value Date   ALT 18 11/09/2021   AST 29 11/09/2021   ALKPHOS 38 (L) 11/09/2021   BILITOT 0.4 11/09/2021   No results found for: "25OHVITD2", "25OHVITD3", "VD25OH"   Review of Systems  Constitutional:  Negative for chills, fatigue and fever.  HENT:  Negative for congestion, hearing loss, tinnitus, trouble swallowing and voice change.   Eyes:  Negative for visual disturbance.  Respiratory:  Negative for cough, chest tightness, shortness of breath and wheezing.   Cardiovascular:  Negative for chest pain, palpitations and leg swelling.  Gastrointestinal:  Negative for abdominal pain, constipation, diarrhea and vomiting.  Endocrine: Negative for polydipsia and polyuria.  Genitourinary:  Negative for dysuria, frequency, genital sores, vaginal bleeding and vaginal discharge.  Musculoskeletal:  Negative for arthralgias, gait problem and joint swelling.  Skin:  Negative for color change and rash.  Neurological:  Negative for dizziness, tremors, light-headedness and headaches.  Hematological:  Negative for adenopathy. Does not bruise/bleed easily.  Psychiatric/Behavioral:  Positive for depression. Negative for dysphoric mood and sleep disturbance. The patient is not nervous/anxious.     Patient Active Problem List   Diagnosis Date Noted   Stage 3b chronic kidney disease (CKD) (HCC) 11/09/2021   Degenerative disc disease, lumbar 03/05/2021   Gastroesophageal reflux disease without  esophagitis 10/07/2020   Current use of estrogen therapy 10/07/2020   Primary osteoarthritis of right hip 08/26/2020   Benign essential tremor 09/24/2019   Primary osteoarthritis of left knee 09/24/2019   Pain of left hip joint 06/01/2018   Neuropathy of foot 12/23/2016   Major depressive disorder with single episode, in partial remission (HCC) 05/31/2016   Hyperlipidemia, mixed 10/22/2014   B12 nutritional deficiency 10/22/2014   Hot flash, menopausal 10/22/2014    No Known Allergies  Past Surgical History:  Procedure Laterality Date   ABDOMINAL HYSTERECTOMY     APPENDECTOMY     BREAST BIOPSY Left    x2-neg   CATARACT EXTRACTION W/PHACO Left 05/25/2015   Procedure: CATARACT EXTRACTION PHACO AND INTRAOCULAR LENS PLACEMENT (IOC);  Surgeon: Sallee Lange, MD;  Location: [facility] ORS;  Service: Ophthalmology;  Laterality: Left;  [place]: 01:25.9 AP%: 26.3 CDE: 37.92  Lot # [id number]   CATARACT EXTRACTION W/PHACO Right 07/20/2015   Procedure: CATARACT EXTRACTION PHACO AND INTRAOCULAR LENS PLACEMENT (IOC);  Surgeon: Sallee Lange, MD;  Location: [facility] ORS;  Service: Ophthalmology;  Laterality: Right;  [place]      1:12.7 AP%    25.3 CDE    29.53 fluid casette lot # [id number]  exp 09/15/2016    Social History   Tobacco Use   Smoking status: Former    Current packs/day: 0.00    Average packs/day: 0.5 packs/day for 25.0 years (12.5 ttl pk-yrs)    Types: Cigarettes    Start date: 68    Quit date: 56    Years since quitting: 32.6   Smokeless tobacco: Never   Tobacco comments:    quit early 90's  Vaping Use   Vaping status: Never Used  Substance Use Topics   Alcohol use: Not Currently    Alcohol/week: 0.0 standard drinks of alcohol   Drug use: No     Medication list has been reviewed and updated.  Current Meds  Medication Sig   albuterol (VENTOLIN HFA) 108 (90 Base) MCG/ACT inhaler Inhale 2 puffs into the lungs every 6 (six) hours as needed for wheezing or shortness of breath.    desvenlafaxine (PRISTIQ) 50 MG 24 hr tablet Take 1 tablet (50 mg total) by mouth daily.   diazepam (VALIUM) 10 MG tablet Take 10 mg by mouth daily as needed. Half a tablet   estradiol (ESTRACE) 0.5 MG tablet TAKE ONE TABLET BY MOUTH ONCE DAILY   fenofibrate (TRICOR) 145 MG tablet TAKE 1 TABLET BY MOUTH ONCE DAILY.   simvastatin (ZOCOR) 40 MG tablet Take 1 tablet (40 mg total) by mouth daily. for cholesterol.   [DISCONTINUED] Acetaminophen (TYLENOL EXTRA STRENGTH PO) Take by mouth daily. Takes 2-4 a day       11/21/2022    9:27 AM 05/23/2022    1:54 PM 11/09/2021    8:48 AM 08/19/2021   11:40 AM  GAD 7 : Generalized Anxiety Score  Nervous, Anxious, on Edge 0 0 0 0  Control/stop worrying 0 0 0 0  Worry too much - different things 0 0 0 0  Trouble relaxing 0 0 0 0  Restless 0 0 0 0  Easily annoyed or irritable 0 0 0 0  Afraid - awful might happen 0 0 0 0  Total GAD 7 Score 0 0 0 0  Anxiety Difficulty Not difficult at all Not difficult at all Not difficult at all Not difficult at all       11/21/2022    9:27 AM 10/27/2022   11:30 AM 05/23/2022    1:54 PM  Depression screen PHQ 2/9  Decreased Interest 0 0 1  Down, Depressed, Hopeless 0 0 0  PHQ - 2 Score 0 0 1  Altered sleeping 0 0 0  Tired, decreased energy 3 0 2  Change in appetite 0 0 1  Feeling bad or failure about yourself  0 0 0  Trouble concentrating 0 0 1  Moving slowly or fidgety/restless 0 0 0  Suicidal thoughts 0 0 0  PHQ-9 Score 3 0 5  Difficult doing work/chores Not difficult at all Not difficult at all Somewhat difficult    BP Readings from Last 3 Encounters:  11/21/22 118/66  05/23/22 132/78  11/09/21 135/78    Physical Exam Vitals and nursing note reviewed.  Constitutional:      General: She is not in acute distress.    Appearance: She is well-developed.  HENT:     Head: Normocephalic and atraumatic.     Right Ear: Tympanic membrane and ear canal normal.     Left Ear: Tympanic membrane and ear canal  normal.     Nose:     Right Sinus: No maxillary sinus tenderness.     Left Sinus: No maxillary sinus tenderness.  Eyes:     General: No scleral icterus.       Right eye: No discharge.        Left eye: No discharge.     Conjunctiva/sclera: Conjunctivae normal.  Neck:     Thyroid: No thyromegaly.     Vascular: No carotid bruit.  Cardiovascular:     Rate and Rhythm: Normal rate and regular rhythm.     Pulses: Normal pulses.     Heart sounds: Normal heart sounds.  Pulmonary:     Effort: Pulmonary effort is normal. No respiratory distress.     Breath sounds: No wheezing.  Chest:  Breasts:    Right: No mass, nipple discharge, skin change or tenderness.     Left: No mass, nipple discharge, skin change or tenderness.  Abdominal:     General: Bowel sounds are normal.     Palpations: Abdomen is soft.     Tenderness: There is no abdominal tenderness.  Musculoskeletal:     Cervical back: Normal range of motion. No erythema.     Right knee: No bony tenderness. Normal range of motion. No tenderness.     Left knee: Swelling and effusion present. No bony tenderness. Normal range of motion. No tenderness.     Right lower leg: No edema.     Left lower leg: No edema.  Lymphadenopathy:     Cervical: No cervical adenopathy.  Skin:    General: Skin is warm and dry.     Findings: No rash.  Neurological:     Mental Status: She is alert and oriented to person, place, and time.     Cranial Nerves: No cranial nerve deficit.     Sensory: No sensory deficit.     Deep Tendon Reflexes: Reflexes are normal and symmetric.  Psychiatric:        Attention and Perception: Attention normal.        Mood and Affect: Mood normal.  Wt Readings from Last 3 Encounters:  11/21/22 145 lb 3.2 oz (65.9 kg)  10/27/22 144 lb (65.3 kg)  05/23/22 144 lb (65.3 kg)    BP 118/66   Pulse 87   Ht 5\' 1"  (1.549 m)   Wt 145 lb 3.2 oz (65.9 kg)   SpO2 96%   BMI 27.44 kg/m   Assessment and Plan:  Problem List  Items Addressed This Visit       Unprioritized   Stage 3b chronic kidney disease (CKD) (HCC) (Chronic)    Stable GFR ~ 50       Relevant Orders   CBC with Differential/Platelet   Comprehensive metabolic panel   Primary osteoarthritis of left knee    No longer taking Aleve due to renal issues Recently tried a supplement which may be helping - collagen?      Major depressive disorder with single episode, in partial remission (HCC) (Chronic)    Being followed by psych Clinically stable on current regimen with good control of symptoms, No SI or HI. No change in management at this time. On Pristiq 50 mg and Valium prn      Relevant Orders   TSH   Hyperlipidemia, mixed (Chronic)    LDL is  Lab Results  Component Value Date   LDLCALC 89 11/09/2021   Currently being treated with statin and fenofibrate with good compliance and no concerns.       Relevant Orders   Lipid panel   Hot flash, menopausal    Maintained on low dose estrogen to control severe symptoms      B12 nutritional deficiency (Chronic)    Has been on B12 injections in the past. Not currently taking any supplements. Will give IM injection today and check level      Relevant Orders   Vitamin B12   Other Visit Diagnoses     Annual physical exam    -  Primary   Tetanus needs updating if she has an injury otherwise up to date or aged out       No follow-ups on file.    Reubin Milan, MD [facility] [place]

## 2022-11-22 LAB — CBC WITH DIFFERENTIAL/PLATELET
Basophils Absolute: 0.1 10*3/uL (ref 0.0–0.2)
Basos: 1 %
EOS (ABSOLUTE): 0.3 10*3/uL (ref 0.0–0.4)
Eos: 4 %
Hematocrit: 43.3 % (ref 34.0–46.6)
Hemoglobin: 14.1 g/dL (ref 11.1–15.9)
Immature Grans (Abs): 0 10*3/uL (ref 0.0–0.1)
Immature Granulocytes: 0 %
Lymphocytes Absolute: 2 10*3/uL (ref 0.7–3.1)
Lymphs: 34 %
MCH: 29.7 pg (ref 26.6–33.0)
MCHC: 32.6 g/dL (ref 31.5–35.7)
MCV: 91 fL (ref 79–97)
Monocytes Absolute: 0.5 10*3/uL (ref 0.1–0.9)
Monocytes: 9 %
Neutrophils Absolute: 2.9 10*3/uL (ref 1.4–7.0)
Neutrophils: 52 %
Platelets: 332 10*3/uL (ref 150–450)
RBC: 4.75 x10E6/uL (ref 3.77–5.28)
RDW: 12.9 % (ref 11.7–15.4)
WBC: 5.7 10*3/uL (ref 3.4–10.8)

## 2022-11-22 LAB — COMPREHENSIVE METABOLIC PANEL
ALT: 20 [IU]/L (ref 0–32)
AST: 36 [IU]/L (ref 0–40)
Albumin: 4.3 g/dL (ref 3.7–4.7)
Alkaline Phosphatase: 43 [IU]/L — ABNORMAL LOW (ref 44–121)
BUN/Creatinine Ratio: 15 (ref 12–28)
BUN: 17 mg/dL (ref 8–27)
Bilirubin Total: 0.4 mg/dL (ref 0.0–1.2)
CO2: 21 mmol/L (ref 20–29)
Calcium: 10.1 mg/dL (ref 8.7–10.3)
Chloride: 106 mmol/L (ref 96–106)
Creatinine, Ser: 1.13 mg/dL — ABNORMAL HIGH (ref 0.57–1.00)
Globulin, Total: 2.4 g/dL (ref 1.5–4.5)
Glucose: 102 mg/dL — ABNORMAL HIGH (ref 70–99)
Potassium: 4.5 mmol/L (ref 3.5–5.2)
Sodium: 140 mmol/L (ref 134–144)
Total Protein: 6.7 g/dL (ref 6.0–8.5)
eGFR: 48 mL/min/{1.73_m2} — ABNORMAL LOW (ref >59)

## 2022-11-22 LAB — LIPID PANEL
Chol/HDL Ratio: 2.7 {ratio} (ref 0.0–4.4)
Cholesterol, Total: 189 mg/dL (ref 100–199)
HDL: 71 mg/dL (ref >39)
LDL Chol Calc (NIH): 103 mg/dL — ABNORMAL HIGH (ref 0–99)
Triglycerides: 80 mg/dL (ref 0–149)
VLDL Cholesterol Cal: 15 mg/dL (ref 5–40)

## 2022-11-22 LAB — VITAMIN B12: Vitamin B-12: >2000 pg/mL — ABNORMAL HIGH (ref 232–1245)

## 2022-11-22 LAB — TSH: TSH: 5.7 u[IU]/mL — ABNORMAL HIGH (ref 0.450–4.500)

## 2022-12-14 ENCOUNTER — Telehealth: Payer: Self-pay | Admitting: Internal Medicine

## 2022-12-14 NOTE — Telephone Encounter (Signed)
Copied from CRM [id number]. Topic: General - Other >> Dec 14, 2022 11:44 AM Epimenio Foot F wrote: Reason for CRM: Pt is calling in because she wanted to inform Dr. Judithann Graves that Victorio Palm passed away.

## 2022-12-16 ENCOUNTER — Encounter: Payer: Medicare Other | Admitting: Internal Medicine

## 2023-01-01 ENCOUNTER — Other Ambulatory Visit: Payer: Self-pay | Admitting: Internal Medicine

## 2023-01-01 DIAGNOSIS — E782 Mixed hyperlipidemia: Secondary | ICD-10-CM

## 2023-02-08 ENCOUNTER — Telehealth: Payer: Self-pay | Admitting: Internal Medicine

## 2023-02-08 NOTE — Telephone Encounter (Signed)
Copied from CRM [id number]. Topic: General - Other >> Feb 08, 2023  1:29 PM Turkey B wrote: Reason for CRM: pt called in needs to renew her handicapped sticker. She is asking for application to be mailed.

## 2023-02-09 NOTE — Telephone Encounter (Signed)
Left VM informing this is completed and I am mailing to her home.  - Jennifer Knapp

## 2023-02-16 DIAGNOSIS — M1712 Unilateral primary osteoarthritis, left knee: Secondary | ICD-10-CM | POA: Diagnosis not present

## 2023-02-16 DIAGNOSIS — M1711 Unilateral primary osteoarthritis, right knee: Secondary | ICD-10-CM | POA: Diagnosis not present

## 2023-02-16 DIAGNOSIS — Z96641 Presence of right artificial hip joint: Secondary | ICD-10-CM | POA: Diagnosis not present

## 2023-02-27 ENCOUNTER — Ambulatory Visit: Payer: Self-pay

## 2023-02-27 NOTE — Telephone Encounter (Signed)
Chief Complaint: Pain with Urination Symptoms: Discomfort with urination moderate, cloudy urine Frequency: constant x 1 week Pertinent Negatives: Patient denies fever, back pain, flank pain, blood in urine Disposition: [] ED /[] Urgent Care (no appt availability in office) / [x] Appointment(In office/virtual)/ []  [facility]/ [] Home Care/ [] Refused Recommended Disposition /[] [facility]/ []  Follow-up with PCP Additional Notes: Patient states she has had moderate discomfort with urination x 1 week now. Patient stated she tried some over the counter treatment for her symptoms but they are not improving. Patient also reported cloudy urine. Patient stated she gets UTI often and it usually starts out like this. Care advice given and patient has been scheduled to be seen by PCP tomorrow at 1020.   Summary: Potential UTI   Pt called reporting that she has a potential UTI, wants to be seen today. Nothing available, please advise     Reason for Disposition  Age > 50 years  Answer Assessment - Initial Assessment Questions 1. SEVERITY: "How bad is the pain?"  (e.g., Scale 1-10; mild, moderate, or severe)   - MILD (1-3): complains slightly about urination hurting   - MODERATE (4-7): interferes with normal activities     - SEVERE (8-10): excruciating, unwilling or unable to urinate because of the pain      Moderate 2. FREQUENCY: "How many times have you had painful urination today?"      4 times or so  3. PATTERN: "Is pain present every time you urinate or just sometimes?"      Yes  4. ONSET: "When did the painful urination start?"      1 week  5. FEVER: "Do you have a fever?" If Yes, ask: "What is your temperature, how was it measured, and when did it start?"     No  6. PAST UTI: "Have you had a urine infection before?" If Yes, ask: "When was the last time?" and "What happened that time?"      Yes, I got antibiotics  7. CAUSE: "What do you think is causing the painful  urination?"  (e.g., UTI, scratch, Herpes sore)     UTI 8. OTHER SYMPTOMS: "Do you have any other symptoms?" (e.g., blood in urine, flank pain, genital sores, urgency, vaginal discharge)     Cloudy urine  Protocols used: Urination Pain - Female-A-AH

## 2023-02-28 ENCOUNTER — Ambulatory Visit (INDEPENDENT_AMBULATORY_CARE_PROVIDER_SITE_OTHER): Payer: Medicare Other | Admitting: Internal Medicine

## 2023-02-28 ENCOUNTER — Encounter: Payer: Self-pay | Admitting: Internal Medicine

## 2023-02-28 VITALS — BP 124/76 | HR 83 | Ht 61.0 in | Wt 147.0 lb

## 2023-02-28 DIAGNOSIS — R300 Dysuria: Secondary | ICD-10-CM

## 2023-02-28 DIAGNOSIS — F331 Major depressive disorder, recurrent, moderate: Secondary | ICD-10-CM | POA: Diagnosis not present

## 2023-02-28 DIAGNOSIS — F419 Anxiety disorder, unspecified: Secondary | ICD-10-CM | POA: Diagnosis not present

## 2023-02-28 LAB — POCT URINALYSIS DIPSTICK
Bilirubin, UA: NEGATIVE
Glucose, UA: NEGATIVE
Ketones, UA: NEGATIVE
Nitrite, UA: NEGATIVE
Protein, UA: POSITIVE — AB
Spec Grav, UA: 1.015 (ref 1.010–1.025)
Urobilinogen, UA: 0.2 U/dL
pH, UA: 5 (ref 5.0–8.0)

## 2023-02-28 MED ORDER — SULFAMETHOXAZOLE-TRIMETHOPRIM 800-160 MG PO TABS
1.0000 | ORAL_TABLET | Freq: Two times a day (BID) | ORAL | 0 refills | Status: AC
Start: 2023-02-28 — End: 2023-03-07

## 2023-02-28 NOTE — Progress Notes (Signed)
Date:  02/28/2023   Name:  Brodie Voisine   DOB:  1940/11/17   MRN:  [id number]   Chief Complaint: Urinary Tract Infection (Dysuria, and pelvic discomfort. Started 1 week ago.)  Urinary Tract Infection  This is a new problem. The current episode started in the past 7 days. The problem occurs every urination. The problem has been unchanged. The pain is mild. There has been no fever. Associated symptoms include frequency and urgency. Pertinent negatives include no chills, discharge, flank pain, hematuria or vomiting. She has tried increased fluids for the symptoms. The treatment provided mild relief.    Review of Systems  Constitutional:  Negative for chills.  Gastrointestinal:  Negative for vomiting.  Genitourinary:  Positive for frequency and urgency. Negative for flank pain and hematuria.     Lab Results  Component Value Date   NA 140 11/21/2022   K 4.5 11/21/2022   CO2 21 11/21/2022   GLUCOSE 102 (H) 11/21/2022   BUN 17 11/21/2022   CREATININE 1.13 (H) 11/21/2022   CALCIUM 10.1 11/21/2022   EGFR 48 (L) 11/21/2022   GFRNONAA 51 (L) 09/24/2019   Lab Results  Component Value Date   CHOL 189 11/21/2022   HDL 71 11/21/2022   LDLCALC 103 (H) 11/21/2022   TRIG 80 11/21/2022   CHOLHDL 2.7 11/21/2022   Lab Results  Component Value Date   TSH 5.700 (H) 11/21/2022   No results found for: "HGBA1C" Lab Results  Component Value Date   WBC 5.7 11/21/2022   HGB 14.1 11/21/2022   HCT 43.3 11/21/2022   MCV 91 11/21/2022   PLT 332 11/21/2022   Lab Results  Component Value Date   ALT 20 11/21/2022   AST 36 11/21/2022   ALKPHOS 43 (L) 11/21/2022   BILITOT 0.4 11/21/2022   No results found for: "25OHVITD2", "25OHVITD3", "VD25OH"   Patient Active Problem List   Diagnosis Date Noted   Stage 3b chronic kidney disease (CKD) (HCC) 11/09/2021   Degenerative disc disease, lumbar 03/05/2021   Gastroesophageal reflux disease without esophagitis 10/07/2020   Current use of  estrogen therapy 10/07/2020   Primary osteoarthritis of right hip 08/26/2020   Benign essential tremor 09/24/2019   Primary osteoarthritis of left knee 09/24/2019   Pain of left hip joint 06/01/2018   Neuropathy of foot 12/23/2016   Major depressive disorder with single episode, in partial remission (HCC) 05/31/2016   Hyperlipidemia, mixed 10/22/2014   B12 nutritional deficiency 10/22/2014   Hot flash, menopausal 10/22/2014    No Known Allergies  Past Surgical History:  Procedure Laterality Date   ABDOMINAL HYSTERECTOMY     APPENDECTOMY     BREAST BIOPSY Left    x2-neg   CATARACT EXTRACTION W/PHACO Left 05/25/2015   Procedure: CATARACT EXTRACTION PHACO AND INTRAOCULAR LENS PLACEMENT (IOC);  Surgeon: Sallee Lange, MD;  Location: [facility] ORS;  Service: Ophthalmology;  Laterality: Left;  [place]: 01:25.9 AP%: 26.3 CDE: 37.92  Lot # [id number]   CATARACT EXTRACTION W/PHACO Right 07/20/2015   Procedure: CATARACT EXTRACTION PHACO AND INTRAOCULAR LENS PLACEMENT (IOC);  Surgeon: Sallee Lange, MD;  Location: [facility] OR[facility];  Service: Ophthalmology;  Laterality: Right;  [place]      1:12.7 AP%    25.3 CDE    29.53 fluid casette lot # [id number]  exp 09/15/2016    Social History   Tobacco Use   Smoking status: Former    Current packs/day: 0.00    Average packs/day: 0.5 packs/day for 25.0 years (12.5 ttl  pk-yrs)    Types: Cigarettes    Start date: 68    Quit date: 50    Years since quitting: 32.8   Smokeless tobacco: Never   Tobacco comments:    quit early 90's  Vaping Use   Vaping status: Never Used  Substance Use Topics   Alcohol use: Not Currently    Alcohol/week: 0.0 standard drinks of alcohol   Drug use: No     Medication list has been reviewed and updated.  Current Meds  Medication Sig   albuterol (VENTOLIN HFA) 108 (90 Base) MCG/ACT inhaler Inhale 2 puffs into the lungs every 6 (six) hours as needed for wheezing or shortness of breath.   desvenlafaxine (PRISTIQ) 50 MG 24 hr  tablet Take 1 tablet (50 mg total) by mouth daily.   diazepam (VALIUM) 10 MG tablet Take 10 mg by mouth daily as needed. Half a tablet   estradiol (ESTRACE) 0.5 MG tablet TAKE ONE TABLET BY MOUTH ONCE DAILY   fenofibrate (TRICOR) 145 MG tablet TAKE 1 TABLET BY MOUTH ONCE DAILY.   simvastatin (ZOCOR) 40 MG tablet TAKE ONE TABLET BY MOUTH ONCE DAILY FOR CHOLESTEROL   sulfamethoxazole-trimethoprim (BACTRIM DS) 800-160 MG tablet Take 1 tablet by mouth 2 (two) times daily for 7 days.       02/28/2023   10:38 AM 11/21/2022    9:27 AM 05/23/2022    1:54 PM 11/09/2021    8:48 AM  GAD 7 : Generalized Anxiety Score  Nervous, Anxious, on Edge 0 0 0 0  Control/stop worrying 0 0 0 0  Worry too much - different things 0 0 0 0  Trouble relaxing 0 0 0 0  Restless 0 0 0 0  Easily annoyed or irritable 0 0 0 0  Afraid - awful might happen 0 0 0 0  Total GAD 7 Score 0 0 0 0  Anxiety Difficulty Not difficult at all Not difficult at all Not difficult at all Not difficult at all       02/28/2023   10:38 AM 11/21/2022    9:27 AM 10/27/2022   11:30 AM  Depression screen PHQ 2/9  Decreased Interest 0 0 0  Down, Depressed, Hopeless 0 0 0  PHQ - 2 Score 0 0 0  Altered sleeping 0 0 0  Tired, decreased energy 0 3 0  Change in appetite 0 0 0  Feeling bad or failure about yourself  0 0 0  Trouble concentrating 0 0 0  Moving slowly or fidgety/restless 0 0 0  Suicidal thoughts 0 0 0  PHQ-9 Score 0 3 0  Difficult doing work/chores Not difficult at all Not difficult at all Not difficult at all    BP Readings from Last 3 Encounters:  02/28/23 124/76  11/21/22 118/66  05/23/22 132/78    Physical Exam Vitals and nursing note reviewed.  Constitutional:      Appearance: She is well-developed.  Cardiovascular:     Rate and Rhythm: Normal rate and regular rhythm.     Heart sounds: Normal heart sounds.  Pulmonary:     Effort: Pulmonary effort is normal. No respiratory distress.     Breath sounds: Normal  breath sounds.  Abdominal:     General: Bowel sounds are normal.     Palpations: Abdomen is soft.     Tenderness: There is abdominal tenderness in the suprapubic area. There is no right CVA tenderness, left CVA tenderness, guarding or rebound.    Lab Results  Component  Value Date   COLORU yellow 02/28/2023   CLARITYU cloudy 02/28/2023   GLUCOSEUR Negative 02/28/2023   BILIRUBINUR neg 02/28/2023   KETONESU neg 02/28/2023   SPECGRAV 1.015 02/28/2023   RBCUR large 3+ 02/28/2023   PHUR 5.0 02/28/2023   PROTEINUR Positive (A) 02/28/2023   UROBILINOGEN 0.2 02/28/2023   LEUKOCYTESUR Large (3+) (A) 02/28/2023     Wt Readings from Last 3 Encounters:  02/28/23 147 lb (66.7 kg)  11/21/22 145 lb 3.2 oz (65.9 kg)  10/27/22 144 lb (65.3 kg)    BP 124/76   Pulse 83   Ht 5\' 1"  (1.549 m)   Wt 147 lb (66.7 kg)   SpO2 98%   BMI 27.78 kg/m   Assessment and Plan:  Problem List Items Addressed This Visit   None Visit Diagnoses     Dysuria    -  Primary   likely UTI - continue to push fluids start Bactrim await culture to adjust antibiotics if needed   Relevant Medications   sulfamethoxazole-trimethoprim (BACTRIM DS) 800-160 MG tablet   Other Relevant Orders   POCT Urinalysis Dipstick (Completed)   Urine Culture       Return for CPX in August.    Reubin Milan, MD [facility] [place]

## 2023-03-03 ENCOUNTER — Other Ambulatory Visit: Payer: Self-pay | Admitting: Internal Medicine

## 2023-03-03 DIAGNOSIS — N390 Urinary tract infection, site not specified: Secondary | ICD-10-CM

## 2023-03-03 LAB — URINE CULTURE

## 2023-03-03 MED ORDER — CEFUROXIME AXETIL 250 MG PO TABS
250.0000 mg | ORAL_TABLET | Freq: Two times a day (BID) | ORAL | 0 refills | Status: AC
Start: 2023-03-03 — End: 2023-03-10

## 2023-03-21 ENCOUNTER — Other Ambulatory Visit: Payer: Self-pay

## 2023-03-21 ENCOUNTER — Telehealth: Payer: Self-pay | Admitting: Internal Medicine

## 2023-03-21 DIAGNOSIS — K5900 Constipation, unspecified: Secondary | ICD-10-CM

## 2023-03-21 NOTE — Telephone Encounter (Signed)
Copied from CRM [id number]. Topic: Referral - Request for Referral >> Mar 21, 2023 11:59 AM Patsy Lager T wrote: Did the patient discuss referral with their provider in the last year? Yes  Appointment offered? No  Type of order/referral and detailed reason for visit: Gastroenterologist, constipation issues  Preference of office, provider, location: [facility]  If referral order, have you been seen by this specialty before? No (If Yes, this issue or another issue? When? Where?  Can we respond through MyChart? No

## 2023-03-21 NOTE — Telephone Encounter (Signed)
Referral placed.  KP 

## 2023-03-21 NOTE — Telephone Encounter (Signed)
Can a referral be placed?  KP

## 2023-04-25 ENCOUNTER — Other Ambulatory Visit: Payer: Self-pay | Admitting: Internal Medicine

## 2023-04-25 DIAGNOSIS — E782 Mixed hyperlipidemia: Secondary | ICD-10-CM

## 2023-04-27 ENCOUNTER — Other Ambulatory Visit: Payer: Self-pay | Admitting: Internal Medicine

## 2023-04-27 DIAGNOSIS — E782 Mixed hyperlipidemia: Secondary | ICD-10-CM

## 2023-04-27 NOTE — Telephone Encounter (Signed)
 Requested by interface surescripts. Last refill documented 04/05/23 #90 . Future visit in 7 months.  Requested Prescriptions  Refused Prescriptions Disp Refills   fenofibrate  (TRICOR ) 145 MG tablet [Pharmacy Med Name: fenofibrate  nanocrystallized 145 mg tablet] 90 tablet 3    Sig: TAKE ONE TABLET BY MOUTH ONCE DAILY     Cardiovascular:  Antilipid - Fibric Acid Derivatives Failed - 04/27/2023 12:03 PM      Failed - Cr in normal range and within 360 days    Creatinine, Ser  Date Value Ref Range Status  11/21/2022 1.13 (H) 0.57 - 1.00 mg/dL Final         Failed - Lipid Panel in normal range within the last 12 months    Cholesterol, Total  Date Value Ref Range Status  11/21/2022 189 100 - 199 mg/dL Final   LDL Chol Calc (NIH)  Date Value Ref Range Status  11/21/2022 103 (H) 0 - 99 mg/dL Final   HDL  Date Value Ref Range Status  11/21/2022 71 >39 mg/dL Final   Triglycerides  Date Value Ref Range Status  11/21/2022 80 0 - 149 mg/dL Final         Passed - ALT in normal range and within 360 days    ALT  Date Value Ref Range Status  11/21/2022 20 0 - 32 IU/L Final         Passed - AST in normal range and within 360 days    AST  Date Value Ref Range Status  11/21/2022 36 0 - 40 IU/L Final         Passed - HGB in normal range and within 360 days    Hemoglobin  Date Value Ref Range Status  11/21/2022 14.1 11.1 - 15.9 g/dL Final         Passed - HCT in normal range and within 360 days    Hematocrit  Date Value Ref Range Status  11/21/2022 43.3 34.0 - 46.6 % Final         Passed - PLT in normal range and within 360 days    Platelets  Date Value Ref Range Status  11/21/2022 332 150 - 450 x10E3/uL Final         Passed - WBC in normal range and within 360 days    WBC  Date Value Ref Range Status  11/21/2022 5.7 3.4 - 10.8 x10E3/uL Final  04/20/2015 15.4 (H) 3.6 - 11.0 K/uL Final         Passed - eGFR is 30 or above and within 360 days    GFR calc Af Amer  Date  Value Ref Range Status  09/24/2019 59 (L) >59 mL/min/1.73 Final    Comment:    **[facility] currently reports eGFR in compliance with the current**   recommendations of the [employer]. [facility] will   update reporting as new guidelines are published from the NKF-ASN   Task force.    GFR calc non Af Amer  Date Value Ref Range Status  09/24/2019 51 (L) >59 mL/min/1.73 Final   eGFR  Date Value Ref Range Status  11/21/2022 48 (L) >59 mL/min/1.73 Final         Passed - Valid encounter within last 12 months    Recent Outpatient Visits           1 month ago Dysuria   [facility] at [facility], Leita DEL, MD   5 months ago Annual physical exam  [facility] Primary Care & Sports Medicine at [facility], Leita DEL, MD   11 months ago Acute cystitis with hematuria   [facility] Primary [facility] & Sports Medicine at [facility], Leita DEL, MD   1 year ago Annual physical exam   [facility] Sports Medicine at [facility], Leita DEL, MD   1 year ago Acute non-recurrent maxillary sinusitis   [facility] Primary Care & Sports Medicine at [facility], Leita DEL, MD       Future Appointments             In 7 months Justus, Leita DEL, MD [facility] Health Primary Care & Sports Medicine at [facility], [facility]

## 2023-05-01 NOTE — Telephone Encounter (Signed)
 Requested Prescriptions  Pending Prescriptions Disp Refills   fenofibrate  (TRICOR ) 145 MG tablet [Pharmacy Med Name: fenofibrate  nanocrystallized 145 mg tablet] 90 tablet 3    Sig: TAKE ONE TABLET BY MOUTH ONCE DAILY     Cardiovascular:  Antilipid - Fibric Acid Derivatives Failed - 05/01/2023 12:11 PM      Failed - Cr in normal range and within 360 days    Creatinine, Ser  Date Value Ref Range Status  11/21/2022 1.13 (H) 0.57 - 1.00 mg/dL Final         Failed - Lipid Panel in normal range within the last 12 months    Cholesterol, Total  Date Value Ref Range Status  11/21/2022 189 100 - 199 mg/dL Final   LDL Chol Calc (NIH)  Date Value Ref Range Status  11/21/2022 103 (H) 0 - 99 mg/dL Final   HDL  Date Value Ref Range Status  11/21/2022 71 >39 mg/dL Final   Triglycerides  Date Value Ref Range Status  11/21/2022 80 0 - 149 mg/dL Final         Passed - ALT in normal range and within 360 days    ALT  Date Value Ref Range Status  11/21/2022 20 0 - 32 IU/L Final         Passed - AST in normal range and within 360 days    AST  Date Value Ref Range Status  11/21/2022 36 0 - 40 IU/L Final         Passed - HGB in normal range and within 360 days    Hemoglobin  Date Value Ref Range Status  11/21/2022 14.1 11.1 - 15.9 g/dL Final         Passed - HCT in normal range and within 360 days    Hematocrit  Date Value Ref Range Status  11/21/2022 43.3 34.0 - 46.6 % Final         Passed - PLT in normal range and within 360 days    Platelets  Date Value Ref Range Status  11/21/2022 332 150 - 450 x10E3/uL Final         Passed - WBC in normal range and within 360 days    WBC  Date Value Ref Range Status  11/21/2022 5.7 3.4 - 10.8 x10E3/uL Final  04/20/2015 15.4 (H) 3.6 - 11.0 K/uL Final         Passed - eGFR is 30 or above and within 360 days    GFR calc Af Amer  Date Value Ref Range Status  09/24/2019 59 (L) >59 mL/min/1.73 Final    Comment:    **[facility] currently  reports eGFR in compliance with the current**   recommendations of the [employer]. [facility] will   update reporting as new guidelines are published from the NKF-ASN   Task force.    GFR calc non Af Amer  Date Value Ref Range Status  09/24/2019 51 (L) >59 mL/min/1.73 Final   eGFR  Date Value Ref Range Status  11/21/2022 48 (L) >59 mL/min/1.73 Final         Passed - Valid encounter within last 12 months    Recent Outpatient Visits           2 months ago Dysuria   [facility] Sports Medicine at [facility], Leita DEL, MD   5 months ago Annual physical exam   [facility] Primary Care & Sports Medicine at [facility], Leita DEL, MD  11 months ago Acute cystitis with hematuria   [facility] Health Primary Care & Sports Medicine at [facility], Leita DEL, MD   1 year ago Annual physical exam   [facility] Primary Care & Sports Medicine at [facility], Leita DEL, MD   1 year ago Acute non-recurrent maxillary sinusitis   [facility] Primary Care & Sports Medicine at [facility], Leita DEL, MD       Future Appointments             In 7 months Justus, Leita DEL, MD [facility] Health Primary Care & Sports Medicine at [facility], [facility]

## 2023-05-10 ENCOUNTER — Other Ambulatory Visit: Payer: Self-pay | Admitting: Internal Medicine

## 2023-05-10 DIAGNOSIS — E782 Mixed hyperlipidemia: Secondary | ICD-10-CM

## 2023-05-10 NOTE — Telephone Encounter (Signed)
Requested Prescriptions  Pending Prescriptions Disp Refills   fenofibrate (TRICOR) 145 MG tablet [Pharmacy Med Name: fenofibrate nanocrystallized 145 mg tablet] 90 tablet 3    Sig: TAKE ONE TABLET BY MOUTH ONCE DAILY     Cardiovascular:  Antilipid - Fibric Acid Derivatives Failed - 05/10/2023  3:02 PM      Failed - Cr in normal range and within 360 days    Creatinine, Ser  Date Value Ref Range Status  11/21/2022 1.13 (H) 0.57 - 1.00 mg/dL Final         Failed - Lipid Panel in normal range within the last 12 months    Cholesterol, Total  Date Value Ref Range Status  11/21/2022 189 100 - 199 mg/dL Final   LDL Chol Calc (NIH)  Date Value Ref Range Status  11/21/2022 103 (H) 0 - 99 mg/dL Final   HDL  Date Value Ref Range Status  11/21/2022 71 >39 mg/dL Final   Triglycerides  Date Value Ref Range Status  11/21/2022 80 0 - 149 mg/dL Final         Passed - ALT in normal range and within 360 days    ALT  Date Value Ref Range Status  11/21/2022 20 0 - 32 IU/L Final         Passed - AST in normal range and within 360 days    AST  Date Value Ref Range Status  11/21/2022 36 0 - 40 IU/L Final         Passed - HGB in normal range and within 360 days    Hemoglobin  Date Value Ref Range Status  11/21/2022 14.1 11.1 - 15.9 g/dL Final         Passed - HCT in normal range and within 360 days    Hematocrit  Date Value Ref Range Status  11/21/2022 43.3 34.0 - 46.6 % Final         Passed - PLT in normal range and within 360 days    Platelets  Date Value Ref Range Status  11/21/2022 332 150 - 450 x10E3/uL Final         Passed - WBC in normal range and within 360 days    WBC  Date Value Ref Range Status  11/21/2022 5.7 3.4 - 10.8 x10E3/uL Final  04/20/2015 15.4 (H) 3.6 - 11.0 K/uL Final         Passed - eGFR is 30 or above and within 360 days    GFR calc Af Amer  Date Value Ref Range Status  09/24/2019 59 (L) >59 mL/min/1.73 Final    Comment:    **[facility] currently  reports eGFR in compliance with the current**   recommendations of the [employer]. [facility] will   update reporting as new guidelines are published from the NKF-ASN   Task force.    GFR calc non Af Amer  Date Value Ref Range Status  09/24/2019 51 (L) >59 mL/min/1.73 Final   eGFR  Date Value Ref Range Status  11/21/2022 48 (L) >59 mL/min/1.73 Final         Passed - Valid encounter within last 12 months    Recent Outpatient Visits           2 months ago Dysuria   [facility] Primary Care & Sports Medicine at [facility], Nyoka Cowden, MD   5 months ago Annual physical exam   [facility] Primary Care & Sports Medicine at [facility], Nyoka Cowden, MD  11 months ago Acute cystitis with hematuria   [facility] Primary Care & Sports Medicine at [facility], Nyoka Cowden, MD   1 year ago Annual physical exam   [facility] Primary Care & Sports Medicine at [facility], Nyoka Cowden, MD   1 year ago Acute non-recurrent maxillary sinusitis   [facility] Primary Care & Sports Medicine at [facility], Nyoka Cowden, MD       Future Appointments             In 6 months Judithann Graves, Nyoka Cowden, MD [facility] Health Primary Care & Sports Medicine at [facility], [facility]

## 2023-05-24 DIAGNOSIS — M1711 Unilateral primary osteoarthritis, right knee: Secondary | ICD-10-CM | POA: Diagnosis not present

## 2023-05-26 ENCOUNTER — Other Ambulatory Visit: Payer: Self-pay | Admitting: Internal Medicine

## 2023-05-26 NOTE — Telephone Encounter (Signed)
 Requested medication (s) are due for refill today - yes  Requested medication (s) are on the active medication list -yes  Future visit scheduled -yes  Last refill: 05/13/22 #90 3RF  Notes to clinic: last MM of record 2017- fails MM protocol  Requested Prescriptions  Pending Prescriptions Disp Refills   estradiol (ESTRACE) 0.5 MG tablet [Pharmacy Med Name: estradiol 0.5 mg tablet] 90 tablet 3    Sig: TAKE ONE TABLET BY MOUTH ONCE DAILY     OB/GYN:  Estrogens Failed - 05/26/2023  3:42 PM      Failed - Mammogram is up-to-date per Health Maintenance      Passed - Last BP in normal range    BP Readings from Last 1 Encounters:  02/28/23 124/76         Passed - Valid encounter within last 12 months    Recent Outpatient Visits           2 months ago Dysuria   [place] [facility] at [facility], Leita DEL, MD   6 months ago Annual physical exam   [facility] Primary Care & Sports Medicine at [facility], Leita DEL, MD   1 year ago Acute cystitis with hematuria   [place] [facility] Medicine at [facility], Leita DEL, MD   1 year ago Annual physical exam   [facility] Primary [facility] & Sports Medicine at [facility], Leita DEL, MD   1 year ago Acute non-recurrent maxillary sinusitis   [place] Primary Care & Sports Medicine at [facility], Leita DEL, MD       Future Appointments             In 6 months Adie Leita DEL, MD [facility] Primary Care & Sports Medicine at [facility], [facility]               Requested Prescriptions  Pending Prescriptions Disp Refills   estradiol (ESTRACE) 0.5 MG tablet [Pharmacy Med Name: estradiol 0.5 mg tablet] 90 tablet 3    Sig: TAKE ONE TABLET BY MOUTH ONCE DAILY     OB/GYN:  Estrogens Failed - 05/26/2023  3:42 PM      Failed - Mammogram is up-to-date per Health Maintenance      Passed - Last BP in normal range    BP Readings from Last 1  Encounters:  02/28/23 124/76         Passed - Valid encounter within last 12 months    Recent Outpatient Visits           2 months ago Dysuria   [facility] Primary Care & Sports Medicine at [facility], Leita DEL, MD   6 months ago Annual physical exam   [facility] Primary Care & Sports Medicine at [facility], Leita DEL, MD   1 year ago Acute cystitis with hematuria   [facility] Primary Care & Sports Medicine at [facility], Leita DEL, MD   1 year ago Annual physical exam   [facility] Primary Care & Sports Medicine at [facility], Leita DEL, MD   1 year ago Acute non-recurrent maxillary sinusitis   [place] Primary Care & Sports Medicine at [facility], Leita DEL, MD       Future Appointments             In 6 months Adie, Leita DEL, MD [facility] Health Primary Care &  Sports Medicine at [employer], [facility]

## 2023-05-29 DIAGNOSIS — M2042 Other hammer toe(s) (acquired), left foot: Secondary | ICD-10-CM | POA: Diagnosis not present

## 2023-05-29 DIAGNOSIS — M2011 Hallux valgus (acquired), right foot: Secondary | ICD-10-CM | POA: Diagnosis not present

## 2023-05-29 DIAGNOSIS — M79672 Pain in left foot: Secondary | ICD-10-CM | POA: Diagnosis not present

## 2023-05-29 DIAGNOSIS — M2041 Other hammer toe(s) (acquired), right foot: Secondary | ICD-10-CM | POA: Diagnosis not present

## 2023-05-29 DIAGNOSIS — M2012 Hallux valgus (acquired), left foot: Secondary | ICD-10-CM | POA: Diagnosis not present

## 2023-06-06 DIAGNOSIS — K5900 Constipation, unspecified: Secondary | ICD-10-CM | POA: Diagnosis not present

## 2023-08-23 DIAGNOSIS — M1712 Unilateral primary osteoarthritis, left knee: Secondary | ICD-10-CM | POA: Diagnosis not present

## 2023-08-23 DIAGNOSIS — M1711 Unilateral primary osteoarthritis, right knee: Secondary | ICD-10-CM | POA: Diagnosis not present

## 2023-08-29 DIAGNOSIS — F331 Major depressive disorder, recurrent, moderate: Secondary | ICD-10-CM | POA: Diagnosis not present

## 2023-08-29 DIAGNOSIS — F419 Anxiety disorder, unspecified: Secondary | ICD-10-CM | POA: Diagnosis not present

## 2023-09-05 DIAGNOSIS — U071 COVID-19: Secondary | ICD-10-CM | POA: Diagnosis not present

## 2023-09-05 DIAGNOSIS — J029 Acute pharyngitis, unspecified: Secondary | ICD-10-CM | POA: Diagnosis not present

## 2023-09-06 ENCOUNTER — Telehealth: Payer: Self-pay | Admitting: Internal Medicine

## 2023-09-06 ENCOUNTER — Encounter: Payer: Self-pay | Admitting: Internal Medicine

## 2023-09-06 DIAGNOSIS — J029 Acute pharyngitis, unspecified: Secondary | ICD-10-CM | POA: Diagnosis not present

## 2023-09-06 NOTE — Telephone Encounter (Signed)
 Please review and write letter if appropriate.  JM

## 2023-09-06 NOTE — Telephone Encounter (Signed)
 Spoke with patient and informed her of letter. She would like for it to be mailed. I let her know I would mail it to her address on file. Confirmed we have correct address and will print off today and send off.  JM

## 2023-09-06 NOTE — Telephone Encounter (Signed)
 Copied from CRM [id number]. Topic: Medical Record Request - Records Request >> Sep 06, 2023 10:45 AM Rosaria Common wrote: Reason for CRM: Pt requesting a letter to be mailed to her home stating that her mailbox needs to be relocated from across the street to the front of her house due to medical reasons. Callback number is [phone].

## 2023-10-27 ENCOUNTER — Telehealth: Payer: Self-pay

## 2023-10-27 NOTE — Telephone Encounter (Signed)
 Patient does not want PT so she dos not need to follow up.  JM   Copied from CRM [id number]. Topic: Clinical - Medical Advice >> Oct 27, 2023 11:47 AM Harlene ORN wrote: Reason for CRM: received a message from [facility] that her PCP wants her to schedule.

## 2023-10-27 NOTE — Telephone Encounter (Signed)
 Copied from CRM [id number]. Topic: General - Other >> Oct 27, 2023 11:54 AM Marissa P wrote: Reason for CRM: Patient called to notify doctor Justus that the call was in regarding if she wanted physical therapy and she doesn't.

## 2023-11-01 ENCOUNTER — Ambulatory Visit: Payer: Medicare Other

## 2023-11-08 ENCOUNTER — Ambulatory Visit: Admitting: Emergency Medicine

## 2023-11-08 VITALS — Ht 60.0 in | Wt 149.0 lb

## 2023-11-08 DIAGNOSIS — Z0000 Encounter for general adult medical examination without abnormal findings: Secondary | ICD-10-CM | POA: Diagnosis not present

## 2023-11-08 NOTE — Patient Instructions (Signed)
 Jennifer Knapp , Thank you for taking time out of your busy schedule to complete your Annual Wellness Visit with me. I enjoyed our conversation and look forward to speaking with you again next year. I, as well as your care team,  appreciate your ongoing commitment to your health goals. Please review the following plan we discussed and let me know if I can assist you in the future. Your Game plan/ To Do List    Referrals: None  Follow up Visits: Next Medicare AWV with our clinical staff: 11/13/24 @ 10:10am (PHONE VISIT)   Have you seen your provider in the last 6 months (3 months if uncontrolled diabetes)? No Next Office Visit with your provider: 11/28/23 @ 8:40am with Dr. Justus  Clinician Recommendations: Get the tetanus and shingles vaccines at your local pharmacy.  Aim for 30 minutes of exercise or brisk walking, 6-8 glasses of water, and 5 servings of fruits and vegetables each day.       This is a list of the screening recommended for you and due dates:  Health Maintenance  Topic Date Due   Zoster (Shingles) Vaccine (1 of 2) Never done   DEXA scan (bone density measurement)  Never done   DTaP/Tdap/Td vaccine (2 - Td or Tdap) 01/13/2021   COVID-19 Vaccine (8 - 2024-25 season) 12/18/2022   Flu Shot  11/17/2023   Medicare Annual Wellness Visit  11/07/2024   Pneumococcal Vaccine for age over 54  Completed   Hepatitis B Vaccine  Aged Out   HPV Vaccine  Aged Out   Meningitis B Vaccine  Aged Out    Advanced directives: (Copy Requested) Please bring a copy of your health care power of attorney and living will to the office to be added to your chart at your convenience. You can mail to [facility] [address] [place], [place] [address] or email to [email or web address] Advance Care Planning is important because it:  [x]  Makes sure you receive the medical care that is consistent with your values, goals, and preferences  [x]  It provides guidance to your family and  loved ones and reduces their decisional burden about whether or not they are making the right decisions based on your wishes.  Follow the link provided in your after visit summary or read over the paperwork we have mailed to you to help you started getting your Advance Directives in place. If you need assistance in completing these, please reach out to us  so that we can help you!  See attachments for Preventive Care and Fall Prevention Tips.   Fall Prevention in the Home, Adult Falls can cause injuries and affect people of all ages. There are many simple things that you can do to make your home safe and to help prevent falls. If you need it, ask for help making these changes. What actions can I take to prevent falls? General information Use good lighting in all rooms. Make sure to: Replace any light bulbs that burn out. Turn on lights if it is dark and use night-lights. Keep items that you use often in easy-to-reach places. Lower the shelves around your home if needed. Move furniture so that there are clear paths around it. Do not keep throw rugs or other things on the floor that can make you trip. If any of your floors are uneven, fix them. Add color or contrast paint or tape to clearly mark and help you see: Grab bars or handrails. First and last steps of  staircases. Where the edge of each step is. If you use a ladder or stepladder: Make sure that it is fully opened. Do not climb a closed ladder. Make sure the sides of the ladder are locked in place. Have someone hold the ladder while you use it. Know where your pets are as you move through your home. What can I do in the bathroom?     Keep the floor dry. Clean up any water that is on the floor right away. Remove soap buildup in the bathtub or shower. Buildup makes bathtubs and showers slippery. Use non-skid mats or decals on the floor of the bathtub or shower. Attach bath mats securely with double-sided, non-slip rug tape. If  you need to sit down while you are in the shower, use a non-slip stool. Install grab bars by the toilet and in the bathtub and shower. Do not use towel bars as grab bars. What can I do in the bedroom? Make sure that you have a light by your bed that is easy to reach. Do not use any sheets or blankets on your bed that hang to the floor. Have a firm bench or chair with side arms that you can use for support when you get dressed. What can I do in the kitchen? Clean up any spills right away. If you need to reach something above you, use a sturdy step stool that has a grab bar. Keep electrical cables out of the way. Do not use floor polish or wax that makes floors slippery. What can I do with my stairs? Do not leave anything on the stairs. Make sure that you have a light switch at the top and the bottom of the stairs. Have them installed if you do not have them. Make sure that there are handrails on both sides of the stairs. Fix handrails that are broken or loose. Make sure that handrails are as long as the staircases. Install non-slip stair treads on all stairs in your home if they do not have carpet. Avoid having throw rugs at the top or bottom of stairs, or secure the rugs with carpet tape to prevent them from moving. Choose a carpet design that does not hide the edge of steps on the stairs. Make sure that carpet is firmly attached to the stairs. Fix any carpet that is loose or worn. What can I do on the outside of my home? Use bright outdoor lighting. Repair the edges of walkways and driveways and fix any cracks. Clear paths of anything that can make you trip, such as tools or rocks. Add color or contrast paint or tape to clearly mark and help you see high doorway thresholds. Trim any bushes or trees on the main path into your home. Check that handrails are securely fastened and in good repair. Both sides of all steps should have handrails. Install guardrails along the edges of any raised  decks or porches. Have leaves, snow, and ice cleared regularly. Use sand, salt, or ice melt on walkways during winter months if you live where there is ice and snow. In the garage, clean up any spills right away, including grease or oil spills. What other actions can I take? Review your medicines with your health care provider. Some medicines can make you confused or feel dizzy. This can increase your chance of falling. Wear closed-toe shoes that fit well and support your feet. Wear shoes that have rubber soles and low heels. Use a cane, walker, scooter, or crutches that  help you move around if needed. Talk with your provider about other ways that you can decrease your risk of falls. This may include seeing a physical therapist to learn to do exercises to improve movement and strength. Where to find more information Centers for Disease Control and Prevention, [employer]: TonerPromos.no [employer] on Aging: BaseRingTones.pl National Institute on Aging: BaseRingTones.pl Contact a health care provider if: You are afraid of falling at home. You feel weak, drowsy, or dizzy at home. You fall at home. Get help right away if you: Lose consciousness or have trouble moving after a fall. Have a fall that causes a head injury. These symptoms may be an emergency. Get help right away. Call 911. Do not wait to see if the symptoms will go away. Do not drive yourself to the hospital. This information is not intended to replace advice given to you by your health care provider. Make sure you discuss any questions you have with your health care provider. Document Revised: 12/06/2021 Document Reviewed: 12/06/2021 Elsevier Patient Education  2024 [employer].

## 2023-11-08 NOTE — Progress Notes (Signed)
 Subjective:   Jennifer Knapp is a 83 y.o. who presents for a Medicare Wellness preventive visit.  As a reminder, Annual Wellness Visits don't include a physical exam, and some assessments may be limited, especially if this visit is performed virtually. We may recommend an in-person follow-up visit with your provider if needed.  Visit Complete: Virtual I connected with  Jennifer Knapp on 11/08/23 by a audio enabled telemedicine application and verified that I am speaking with the correct person using two identifiers.  Patient Location: Home  Provider Location: Home Office  I discussed the limitations of evaluation and management by telemedicine. The patient expressed understanding and agreed to proceed.  Vital Signs: Because this visit was a virtual/telehealth visit, some criteria may be missing or patient reported. Any vitals not documented were not able to be obtained and vitals that have been documented are patient reported.  VideoDeclined- This patient declined Librarian, academic. Therefore the visit was completed with audio only.  Persons Participating in Visit: Patient.  AWV Questionnaire: No: Patient Medicare AWV questionnaire was not completed prior to this visit.  Cardiac Risk Factors include: advanced age (>12men, >23 women);dyslipidemia     Objective:    Today's Vitals   11/08/23 1008  Weight: 149 lb (67.6 kg)  Height: 5' (1.524 m)  PainSc: 8    Body mass index is 29.1 kg/m.     11/08/2023   10:20 AM 10/27/2022   11:31 AM 10/13/2021    1:38 PM 10/12/2020    1:31 PM 10/07/2019    1:31 PM 09/24/2018    2:57 PM 05/31/2016   10:50 AM  Advanced Directives  Does Patient Have a Medical Advance Directive? Yes No Yes Yes No No No   Type of Estate agent of [place];Living will  Healthcare Power of [place];Living will Healthcare Power of [place];Living will     Does patient want to make changes to medical advance  directive? No - Patient declined        Copy of Healthcare Power of Attorney in Chart? No - copy requested  No - copy requested No - copy requested     Would patient like information on creating a medical advance directive?  No - Patient declined   Yes (MAU/Ambulatory/Procedural Areas - Information given) No - Patient declined       Data saved with a previous flowsheet row definition    Current Medications (verified) Outpatient Encounter Medications as of 11/08/2023  Medication Sig   albuterol  (VENTOLIN  HFA) 108 (90 Base) MCG/ACT inhaler Inhale 2 puffs into the lungs every 6 (six) hours as needed for wheezing or shortness of breath.   desvenlafaxine  (PRISTIQ ) 50 MG 24 hr tablet Take 1 tablet (50 mg total) by mouth daily.   diazepam (VALIUM) 10 MG tablet Take 10 mg by mouth daily as needed. Half a tablet (Patient taking differently: Take 10 mg by mouth daily as needed. Taking 1/2 to 1 tablet daily)   estradiol (ESTRACE) 0.5 MG tablet TAKE ONE TABLET BY MOUTH ONCE DAILY   fenofibrate  (TRICOR ) 145 MG tablet TAKE ONE TABLET BY MOUTH ONCE DAILY   naproxen sodium (ALEVE) 220 MG tablet Take 440 mg by mouth daily.   simvastatin  (ZOCOR ) 40 MG tablet TAKE ONE TABLET BY MOUTH ONCE DAILY FOR CHOLESTEROL   No facility-administered encounter medications on file as of 11/08/2023.    Allergies (verified) Patient has no known allergies.   History: Past Medical History:  Diagnosis Date  AB (asthmatic bronchitis) 10/22/2014   Anxiety    Arthritis    Asthma    Bronchitis    CHRONIC   Depression    Elevated cholesterol    HLD (hyperlipidemia)    Past Surgical History:  Procedure Laterality Date   ABDOMINAL HYSTERECTOMY     APPENDECTOMY     BREAST BIOPSY Left    x2-neg   CATARACT EXTRACTION W/PHACO Left 05/25/2015   Procedure: CATARACT EXTRACTION PHACO AND INTRAOCULAR LENS PLACEMENT (IOC);  Surgeon: Steven Dingeldein, MD;  Location: [facility] ORS;  Service: Ophthalmology;  Laterality: Left;  US :  01:25.9 AP%: 26.3 CDE: 37.92  Lot # [id number]   CATARACT EXTRACTION W/PHACO Right 07/20/2015   Procedure: CATARACT EXTRACTION PHACO AND INTRAOCULAR LENS PLACEMENT (IOC);  Surgeon: Steven Dingeldein, MD;  Location: [facility] ORS;  Service: Ophthalmology;  Laterality: Right;  US       1:12.7 AP%    25.3 CDE    29.53 fluid casette lot # [id number]  exp 09/15/2016   Family History  Problem Relation Age of Onset   Cancer Mother        colon   Hematuria Mother    Urinary tract infection Mother    Depression Father    Breast cancer Neg Hx    Kidney cancer Neg Hx    Bladder Cancer Neg Hx    Social History   Socioeconomic History   Marital status: Widowed    Spouse name: Not on file   Number of children: 1   Years of education: Not on file   Highest education level: 10th grade  Occupational History   Occupation: retired  Tobacco Use   Smoking status: Former    Current packs/day: 0.00    Average packs/day: 0.5 packs/day for 25.0 years (12.5 ttl pk-yrs)    Types: Cigarettes    Start date: 81    Quit date: 1992    Years since quitting: 33.5   Smokeless tobacco: Never   Tobacco comments:    quit early 90's  Vaping Use   Vaping status: Never Used  Substance and Sexual Activity   Alcohol use: Yes    Alcohol/week: 1.0 - 2.0 standard drink of alcohol    Types: 1 - 2 Cans of beer per week    Comment: 1-2 beers once a week   Drug use: No   Sexual activity: Not Currently  Other Topics Concern   Not on file  Social History Narrative   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   November 29, 2023 husband passed away 11/2022/pbt   Social Drivers of Health   Financial Resource Strain: Low Risk  (11/29/2023)   Overall Financial Resource Strain (CARDIA)    Difficulty of Paying Living Expenses: Not hard at all  Food Insecurity: No Food Insecurity (November 29, 2023)   Hunger Vital Sign    Worried About Running Out of Food in the Last Year: Never true    Ran Out of Food  in the Last Year: Never true  Transportation Needs: No Transportation Needs (2023/11/29)   PRAPARE - Administrator, Civil Service (Medical): No    Lack of Transportation (Non-Medical): No  Physical Activity: Inactive (11/29/2023)   Exercise Vital Sign    Days of Exercise per Week: 0 days    Minutes of Exercise per Session: 0 min  Stress: No Stress Concern Present (11-29-2023)   [employer] of Occupational Health - Occupational Stress Questionnaire    Feeling of Stress:  Not at all  Social Connections: Moderately Isolated (11/08/2023)   Social Connection and Isolation Panel    Frequency of Communication with Friends and Family: More than three times a week    Frequency of Social Gatherings with Friends and Family: Twice a week    Attends Religious Services: More than 4 times per year    Active Member of [employer] or Organizations: No    Attends Banker Meetings: Never    Marital Status: Widowed    Tobacco Counseling Counseling given: Not Answered Tobacco comments: quit early 90's    Clinical Intake:  Pre-visit preparation completed: Yes  Pain : 0-10 Pain Score: 8  Pain Type: Chronic pain Pain Location: Knee Pain Orientation: Right, Left Pain Descriptors / Indicators: Aching     BMI - recorded: 29.1 Nutritional Status: BMI 25 -29 Overweight Nutritional Risks: None Diabetes: No  No results found for: HGBA1C   How often do you need to have someone help you when you read instructions, pamphlets, or other written materials from your doctor or pharmacy?: 1 - Never  Interpreter Needed?: No  Information entered by :: Vina Ned, CMA   Activities of Daily Living     11/08/2023   10:10 AM  In your present state of health, do you have any difficulty performing the following activities:  Hearing? 0  Vision? 0  Difficulty concentrating or making decisions? 1  Comment a little bit forgetful  Walking or climbing stairs? 1  Comment due to  chronic bilateral knee pain  Dressing or bathing? 0  Doing errands, shopping? 0  Preparing Food and eating ? N  Using the Toilet? N  In the past six months, have you accidently leaked urine? Y  Comment wears pad  Do you have problems with loss of bowel control? N  Managing your Medications? N  Managing your Finances? N  Housekeeping or managing your Housekeeping? N    Patient Care Team: Justus Leita DEL, MD as PCP - General (Internal Medicine) Messersmith, Lynwood BRAVO, PA as Physician Assistant (Physician Assistant) Kalman Alm LABOR, MD as Referring Physician (Gastroenterology) Deane Rollo RAMAN, NP as Nurse Practitioner (Psychiatry)  I have updated your Care Teams any recent Medical Services you may have received from other providers in the past year.     Assessment:   This is a routine wellness examination for Norma.  Hearing/Vision screen Hearing Screening - Comments:: Denies hearing loss  Vision Screening - Comments:: Gets routine eye exams, [facility], [place], [place]   Goals Addressed               This Visit's Progress     Increase physical activity (pt-stated)         Depression Screen     11/08/2023   10:17 AM 02/28/2023   10:38 AM 11/21/2022    9:27 AM 10/27/2022   11:30 AM 05/23/2022    1:54 PM 11/09/2021    8:48 AM 10/13/2021    1:37 PM  PHQ 2/9 Scores  PHQ - 2 Score 0 0 0 0 1 0 0  PHQ- 9 Score 1 0 3 0 5 1 2     Fall Risk     11/08/2023   10:21 AM 02/28/2023   10:38 AM 11/21/2022    9:27 AM 10/27/2022   11:32 AM 05/23/2022    1:55 PM  Fall Risk   Falls in the past year? 1 1 1 1  0  Number falls in past yr: 1 1 1  1  0  Injury with Fall? 0 0 0 0 0  Risk for fall due to : History of fall(s);Impaired balance/gait;Orthopedic patient;Impaired mobility History of fall(s) History of fall(s);Impaired balance/gait History of fall(s) No Fall Risks  Follow up Falls evaluation completed;Education provided Falls evaluation completed Falls evaluation completed  Falls evaluation completed;Falls prevention discussed Falls evaluation completed    MEDICARE RISK AT HOME:  Medicare Risk at Home Any stairs in or around the home?: Yes If so, are there any without handrails?: No Home free of loose throw rugs in walkways, pet beds, electrical cords, etc?: No (has 1 loose rug) Adequate lighting in your home to reduce risk of falls?: Yes Life alert?: Yes Use of a cane, walker or w/c?: No Grab bars in the bathroom?: No Shower chair or bench in shower?: No Elevated toilet seat or a handicapped toilet?: Yes  TIMED UP AND GO:  Was the test performed?  No  Cognitive Function: 6CIT completed        11/08/2023   10:24 AM 10/27/2022   11:36 AM 10/07/2019    1:33 PM 06/14/2017   10:29 AM 05/31/2016   10:58 AM  6CIT Screen  What Year? 0 points 0 points 0 points 0 points 0 points  What month? 0 points 0 points 0 points 0 points 0 points  What time? 0 points 0 points 0 points 0 points 0 points  Count back from 20 2 points 0 points 0 points 0 points 0 points  Months in reverse 0 points  0 points 0 points 0 points  Repeat phrase 0 points 2 points 0 points 0 points 0 points  Total Score 2 points  0 points 0 points 0 points    Immunizations Immunization History  Administered Date(s) Administered   Fluad Quad(high Dose 65+) 01/29/2020   Influenza,inj,Quad PF,6+ Mos 12/23/2016   Influenza,inj,quad, With Preservative 02/06/2019   Influenza-Unspecified 06/09/2009, 03/03/2010, 05/03/2012, 01/17/2020, 01/18/2021, 02/13/2022   Moderna Sars-Covid-2 Vaccination 05/03/2019, 05/31/2019, 02/26/2020   PNEUMOCOCCAL CONJUGATE-20 11/09/2021   Pneumococcal Conjugate-13 05/31/2016   Pneumococcal-Unspecified 04/18/2004   Tdap 01/14/2011   Unspecified SARS-COV-2 Vaccination 05/03/2019, 05/31/2019, 02/26/2020, 04/22/2020    Screening Tests Health Maintenance  Topic Date Due   Zoster Vaccines- Shingrix (1 of 2) Never done   DEXA SCAN  Never done   DTaP/Tdap/Td (2 - Td  or Tdap) 01/13/2021   COVID-19 Vaccine (8 - 2024-25 season) 12/18/2022   INFLUENZA VACCINE  11/17/2023   Medicare Annual Wellness (AWV)  11/07/2024   Pneumococcal Vaccine: 50+ Years  Completed   Hepatitis B Vaccines  Aged Out   HPV VACCINES  Aged Out   Meningococcal B Vaccine  Aged Out    Health Maintenance  Health Maintenance Due  Topic Date Due   Zoster Vaccines- Shingrix (1 of 2) Never done   DEXA SCAN  Never done   DTaP/Tdap/Td (2 - Td or Tdap) 01/13/2021   COVID-19 Vaccine (8 - 2024-25 season) 12/18/2022   Health Maintenance Items Addressed: See Nurse Notes at the end of this note  Additional Screening:  Vision Screening: Recommended annual ophthalmology exams for early detection of glaucoma and other disorders of the eye. Would you like a referral to an eye doctor? No    Dental Screening: Recommended annual dental exams for proper oral hygiene  Community Resource Referral / Chronic Care Management: CRR required this visit?  No   CCM required this visit?  No   Plan:    I have personally reviewed and noted the following  in the patient's chart:   Medical and social history Use of alcohol, tobacco or illicit drugs  Current medications and supplements including opioid prescriptions. Patient is not currently taking opioid prescriptions. Functional ability and status Nutritional status Physical activity Advanced directives List of other physicians Hospitalizations, surgeries, and ER visits in previous 12 months Vitals Screenings to include cognitive, depression, and falls Referrals and appointments  In addition, I have reviewed and discussed with patient certain preventive protocols, quality metrics, and best practice recommendations. A written personalized care plan for preventive services as well as general preventive health recommendations were provided to patient.   Vina Ned, CMA   11/08/2023   After Visit Summary: (MyChart) Due to this being a  telephonic visit, the after visit summary with patients personalized plan was offered to patient via MyChart   Notes:  6 CIT Score - 2 Needs Tdap and Shingles vaccines (pharmacy) Declined Covid vaccine Declined DEXA scan Screening colonoscopy and mammogram no longer recommended due to age.

## 2023-11-13 DIAGNOSIS — H52203 Unspecified astigmatism, bilateral: Secondary | ICD-10-CM | POA: Diagnosis not present

## 2023-11-28 ENCOUNTER — Encounter: Payer: Self-pay | Admitting: Internal Medicine

## 2023-11-28 ENCOUNTER — Ambulatory Visit (INDEPENDENT_AMBULATORY_CARE_PROVIDER_SITE_OTHER): Payer: Self-pay | Admitting: Internal Medicine

## 2023-11-28 VITALS — BP 122/74 | HR 89 | Ht 60.0 in | Wt 148.0 lb

## 2023-11-28 DIAGNOSIS — Z0000 Encounter for general adult medical examination without abnormal findings: Secondary | ICD-10-CM

## 2023-11-28 DIAGNOSIS — E782 Mixed hyperlipidemia: Secondary | ICD-10-CM

## 2023-11-28 DIAGNOSIS — N1832 Chronic kidney disease, stage 3b: Secondary | ICD-10-CM

## 2023-11-28 DIAGNOSIS — S50312A Abrasion of left elbow, initial encounter: Secondary | ICD-10-CM

## 2023-11-28 DIAGNOSIS — Z23 Encounter for immunization: Secondary | ICD-10-CM | POA: Diagnosis not present

## 2023-11-28 DIAGNOSIS — E538 Deficiency of other specified B group vitamins: Secondary | ICD-10-CM | POA: Diagnosis not present

## 2023-11-28 DIAGNOSIS — S8010XA Contusion of unspecified lower leg, initial encounter: Secondary | ICD-10-CM

## 2023-11-28 DIAGNOSIS — F324 Major depressive disorder, single episode, in partial remission: Secondary | ICD-10-CM

## 2023-11-28 MED ORDER — SIMVASTATIN 40 MG PO TABS: 40.0000 mg | ORAL_TABLET | Freq: Every day | ORAL | 3 refills | Status: AC

## 2023-11-28 MED ORDER — SILVER SULFADIAZINE 1 % EX CREA
1.0000 | TOPICAL_CREAM | Freq: Every day | CUTANEOUS | 0 refills | Status: AC
Start: 2023-11-28 — End: ?

## 2023-11-28 MED ORDER — FENOFIBRATE 145 MG PO TABS
145.0000 mg | ORAL_TABLET | Freq: Every day | ORAL | 3 refills | Status: AC
Start: 2023-11-28 — End: ?

## 2023-11-28 NOTE — Assessment & Plan Note (Signed)
 LDL is  Lab Results  Component Value Date   LDLCALC 103 (H) 11/21/2022   Currently taking simvastatin  and fenofibrate . No medication side effects or other concerns. Recommended LDL goal is < 70.

## 2023-11-28 NOTE — Assessment & Plan Note (Signed)
 Supplemented orally. Lab Results  Component Value Date   VITAMINB12 >2000 (H) 11/21/2022

## 2023-11-28 NOTE — Assessment & Plan Note (Signed)
 Stable GFR; avoiding nephrotoxins
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
## 2023-11-28 NOTE — Assessment & Plan Note (Signed)
 Clinically stable on Pristiq  and Valium - followed by psych.   No SI or HI on evaluation. Plan to continue same medications for now.

## 2023-12-07 DIAGNOSIS — N1832 Chronic kidney disease, stage 3b: Secondary | ICD-10-CM | POA: Diagnosis not present

## 2023-12-07 DIAGNOSIS — F324 Major depressive disorder, single episode, in partial remission: Secondary | ICD-10-CM | POA: Diagnosis not present

## 2023-12-07 DIAGNOSIS — E782 Mixed hyperlipidemia: Secondary | ICD-10-CM | POA: Diagnosis not present

## 2023-12-08 ENCOUNTER — Ambulatory Visit: Payer: Self-pay | Admitting: Internal Medicine

## 2023-12-08 LAB — CBC WITH DIFFERENTIAL/PLATELET
Basophils Absolute: 0.1 x10E3/uL (ref 0.0–0.2)
Basos: 1 %
EOS (ABSOLUTE): 0.3 x10E3/uL (ref 0.0–0.4)
Eos: 5 %
Hematocrit: 42 % (ref 34.0–46.6)
Hemoglobin: 13.7 g/dL (ref 11.1–15.9)
Immature Grans (Abs): 0 x10E3/uL (ref 0.0–0.1)
Immature Granulocytes: 0 %
Lymphocytes Absolute: 2.1 x10E3/uL (ref 0.7–3.1)
Lymphs: 35 %
MCH: 31.1 pg (ref 26.6–33.0)
MCHC: 32.6 g/dL (ref 31.5–35.7)
MCV: 95 fL (ref 79–97)
Monocytes Absolute: 0.6 x10E3/uL (ref 0.1–0.9)
Monocytes: 10 %
Neutrophils Absolute: 3 x10E3/uL (ref 1.4–7.0)
Neutrophils: 49 %
Platelets: 312 x10E3/uL (ref 150–450)
RBC: 4.41 x10E6/uL (ref 3.77–5.28)
RDW: 13.1 % (ref 11.7–15.4)
WBC: 5.9 x10E3/uL (ref 3.4–10.8)

## 2023-12-08 LAB — LIPID PANEL
Chol/HDL Ratio: 2.8 ratio (ref 0.0–4.4)
Cholesterol, Total: 179 mg/dL (ref 100–199)
HDL: 64 mg/dL (ref >39)
LDL Chol Calc (NIH): 99 mg/dL (ref 0–99)
Triglycerides: 88 mg/dL (ref 0–149)
VLDL Cholesterol Cal: 16 mg/dL (ref 5–40)

## 2023-12-08 LAB — TSH+FREE T4
Free T4: 1 ng/dL (ref 0.82–1.77)
TSH: 4.9 u[IU]/mL — ABNORMAL HIGH (ref 0.450–4.500)

## 2023-12-08 LAB — COMPREHENSIVE METABOLIC PANEL WITH GFR
ALT: 17 IU/L (ref 0–32)
AST: 31 IU/L (ref 0–40)
Albumin: 4 g/dL (ref 3.7–4.7)
Alkaline Phosphatase: 42 IU/L — ABNORMAL LOW (ref 44–121)
BUN/Creatinine Ratio: 15 (ref 12–28)
BUN: 17 mg/dL (ref 8–27)
Bilirubin Total: 0.4 mg/dL (ref 0.0–1.2)
CO2: 20 mmol/L (ref 20–29)
Calcium: 9.8 mg/dL (ref 8.7–10.3)
Chloride: 103 mmol/L (ref 96–106)
Creatinine, Ser: 1.14 mg/dL — ABNORMAL HIGH (ref 0.57–1.00)
Globulin, Total: 2.5 g/dL (ref 1.5–4.5)
Glucose: 94 mg/dL (ref 70–99)
Potassium: 4.4 mmol/L (ref 3.5–5.2)
Sodium: 139 mmol/L (ref 134–144)
Total Protein: 6.5 g/dL (ref 6.0–8.5)
eGFR: 47 mL/min/1.73 — ABNORMAL LOW (ref >59)

## 2023-12-13 DIAGNOSIS — M1712 Unilateral primary osteoarthritis, left knee: Secondary | ICD-10-CM | POA: Diagnosis not present

## 2023-12-13 DIAGNOSIS — M1711 Unilateral primary osteoarthritis, right knee: Secondary | ICD-10-CM | POA: Diagnosis not present

## 2024-02-06 ENCOUNTER — Telehealth: Payer: Self-pay

## 2024-02-06 NOTE — Telephone Encounter (Signed)
 Copied from CRM #[id number]. Topic: Clinical - Medication Question >> Feb 06, 2024  9:14 AM Donna BRAVO wrote: Reason for CRM: patient is having teeth cleaned on 02/16/24 and requires an antibiotic to take before the cleaning. Patient thinks it is Amoxicillin .  Her new dentist will not prescribe this for her.  Patient pharmacy: [facility] - [place], [place] - [address] [place] [address] Phone: [phone] Fax: [phone] Hours: Not open 24 hours

## 2024-02-12 ENCOUNTER — Encounter: Payer: Self-pay | Admitting: Internal Medicine

## 2024-02-12 ENCOUNTER — Other Ambulatory Visit: Payer: Self-pay

## 2024-02-12 MED ORDER — AMOXICILLIN 500 MG PO CAPS: 2000.0000 mg | ORAL_CAPSULE | Freq: Once | ORAL | 0 refills | Status: DC

## 2024-02-12 NOTE — Telephone Encounter (Signed)
 Patient informed we don't usually send these in. Spoke with pt's dentist office- Dr Carlin Perkin[facility] in [place], [place]. They said they have never seen her before, so they cannot prescribe them for her until after her first visit/cleaning.  Sent in abx per Dr Justus. Pt informed.  - Dudley Cooley M.

## 2024-02-12 NOTE — Telephone Encounter (Signed)
 Called to check the status of this request, please advise. Pt is requesting a call back from the clinic today

## 2024-02-12 NOTE — Telephone Encounter (Unsigned)
 Copied from CRM [id number]. Topic: General - Other >> Feb 12, 2024  1:23 PM Jennifer Knapp wrote: Reason for CRM: Reason for CRM: Patient called to follow up on request from 02/06/24- states that she haven't heard back from pcp   Patient is having teeth cleaned on 02/16/24 and requires an antibiotic to take before the cleaning. Patient thinks it is Amoxicillin .  Her new dentist will not prescribe this for her.  Pharmacy: [facility] - [place], [place] - [address] [place] [address] Phone: [phone] Fax: [phone] Hours: Not open 24 hours  Please call the patient at  [phone]

## 2024-02-19 ENCOUNTER — Other Ambulatory Visit: Payer: Self-pay | Admitting: Internal Medicine

## 2024-02-19 ENCOUNTER — Other Ambulatory Visit: Payer: Self-pay

## 2024-02-19 ENCOUNTER — Ambulatory Visit: Payer: Self-pay | Admitting: Internal Medicine

## 2024-02-19 MED ORDER — AMOXICILLIN 500 MG PO CAPS: 2000.0000 mg | ORAL_CAPSULE | Freq: Once | ORAL | 0 refills | Status: AC

## 2024-02-19 NOTE — Telephone Encounter (Signed)
 Copied from CRM [id number]. Topic: Clinical - Medication Question >> Feb 19, 2024  9:23 AM Jennifer Knapp wrote: Reason for CRM: Patient requests for some antibiotics to be sent to   [facility] - [place], [place] - [address] [place] [address] Phone: [phone] Fax: [phone]  Patient is having dental work done tomorrow 02-20-24 and needs script today.  Patient request return call if any issues with this.

## 2024-04-08 ENCOUNTER — Telehealth: Payer: Self-pay | Admitting: Internal Medicine

## 2024-04-08 ENCOUNTER — Telehealth: Payer: Self-pay

## 2024-04-08 NOTE — Telephone Encounter (Signed)
 Copied from CRM #[id number]. Topic: General - Other >> Apr 08, 2024  4:06 PM Winona SAUNDERS wrote: Thea corrdinator from [employer]- calling to assist the pt with requesting a blood pressure cuff. Please contact the pt to assist further

## 2024-04-08 NOTE — Telephone Encounter (Signed)
 Please review if referral is appropriate?

## 2024-04-08 NOTE — Telephone Encounter (Signed)
 Copied from CRM [id number]. Topic: Referral - Request for Referral >> Apr 08, 2024  2:18 PM Tinnie BROCKS wrote: Powell, a care coordinator with [facility] is calling on patient's behalf to request a referral to a nephrologist to assist in managing her chronic kidney disease stage 3. No specific provider requested. Pt will be transferring to Dr. Sol. If an appt is needed, please reach out to patient at [phone]

## 2024-04-16 ENCOUNTER — Other Ambulatory Visit: Payer: Self-pay

## 2024-04-16 NOTE — Telephone Encounter (Signed)
Sent pt Mychart message.  KP 

## 2024-04-16 NOTE — Telephone Encounter (Signed)
 Pt does not have high BP. Pt can purchase a cuff OTC.  KP

## 2024-04-22 ENCOUNTER — Other Ambulatory Visit: Payer: Self-pay

## 2024-04-22 ENCOUNTER — Emergency Department

## 2024-04-22 DIAGNOSIS — Z5321 Procedure and treatment not carried out due to patient leaving prior to being seen by health care provider: Secondary | ICD-10-CM | POA: Insufficient documentation

## 2024-04-22 DIAGNOSIS — Y900 Blood alcohol level of less than 20 mg/100 ml: Secondary | ICD-10-CM | POA: Insufficient documentation

## 2024-04-22 DIAGNOSIS — H538 Other visual disturbances: Secondary | ICD-10-CM | POA: Insufficient documentation

## 2024-04-22 LAB — CBC
HCT: 41.3 % (ref 36.0–46.0)
Hemoglobin: 13.1 g/dL (ref 12.0–15.0)
MCH: 31.4 pg (ref 26.0–34.0)
MCHC: 31.7 g/dL (ref 30.0–36.0)
MCV: 99 fL (ref 80.0–100.0)
Platelets: 363 K/uL (ref 150–400)
RBC: 4.17 MIL/uL (ref 3.87–5.11)
RDW: 14.3 % (ref 11.5–15.5)
WBC: 7.6 K/uL (ref 4.0–10.5)
nRBC: 0 % (ref 0.0–0.2)

## 2024-04-22 LAB — COMPREHENSIVE METABOLIC PANEL WITH GFR
ALT: 13 U/L (ref 0–44)
AST: 31 U/L (ref 15–41)
Albumin: 4.2 g/dL (ref 3.5–5.0)
Alkaline Phosphatase: 55 U/L (ref 38–126)
Anion gap: 10 (ref 5–15)
BUN: 21 mg/dL (ref 8–23)
CO2: 25 mmol/L (ref 22–32)
Calcium: 10.2 mg/dL (ref 8.9–10.3)
Chloride: 103 mmol/L (ref 98–111)
Creatinine, Ser: 1.02 mg/dL — ABNORMAL HIGH (ref 0.44–1.00)
GFR, Estimated: 54 mL/min — ABNORMAL LOW
Glucose, Bld: 126 mg/dL — ABNORMAL HIGH (ref 70–99)
Potassium: 4.5 mmol/L (ref 3.5–5.1)
Sodium: 138 mmol/L (ref 135–145)
Total Bilirubin: 0.2 mg/dL (ref 0.0–1.2)
Total Protein: 7.1 g/dL (ref 6.5–8.1)

## 2024-04-22 LAB — DIFFERENTIAL
Abs Immature Granulocytes: 0.02 K/uL (ref 0.00–0.07)
Basophils Absolute: 0.1 K/uL (ref 0.0–0.1)
Basophils Relative: 1 %
Eosinophils Absolute: 0.5 K/uL (ref 0.0–0.5)
Eosinophils Relative: 7 %
Immature Granulocytes: 0 %
Lymphocytes Relative: 25 %
Lymphs Abs: 1.9 K/uL (ref 0.7–4.0)
Monocytes Absolute: 0.7 K/uL (ref 0.1–1.0)
Monocytes Relative: 9 %
Neutro Abs: 4.5 K/uL (ref 1.7–7.7)
Neutrophils Relative %: 58 %

## 2024-04-22 LAB — PROTIME-INR
INR: 1.1 (ref 0.8–1.2)
Prothrombin Time: 14.8 s (ref 11.4–15.2)

## 2024-04-22 LAB — ETHANOL: Alcohol, Ethyl (B): <15 mg/dL

## 2024-04-22 NOTE — ED Triage Notes (Signed)
"   Pt comes via [facility] from home with c/o two spells of blurry vision. One yesterday and it resolved and another today that resolved. Pt denies any dizziness. Pt states no pain. Pt denies any numbness or tingling.  BP 130/74 CBG 125 "

## 2024-04-23 ENCOUNTER — Emergency Department
Admission: EM | Admit: 2024-04-23 | Discharge: 2024-04-23 | Discharge status: Against Medical Advice | Attending: Emergency Medicine | Admitting: Emergency Medicine

## 2024-05-23 ENCOUNTER — Other Ambulatory Visit: Payer: Self-pay | Admitting: Internal Medicine

## 2024-05-23 NOTE — Telephone Encounter (Unsigned)
 Copied from CRM #[id number]. Topic: Clinical - Medication Refill >> May 23, 2024  3:36 PM Nathanel BROCKS wrote: Medication: estradiol  (ESTRACE ) 0.5 MG tablet  Has the patient contacted their pharmacy? Yes  This is the patient's preferred pharmacy:  [facility] - [place], [place] - [address] [place] [address] [place] [address] Phone: [phone] Fax: [phone]  Is this the correct pharmacy for this prescription? Yes If no, delete pharmacy and type the correct one.   Has the prescription been filled recently? Yes  Is the patient out of the medication? Yes  Has the patient been seen for an appointment in the last year OR does the patient have an upcoming appointment? Yes  Can we respond through MyChart? No  Agent: Please be advised that Rx refills may take up to 3 business days. We ask that you follow-up with your pharmacy.

## 2024-05-24 ENCOUNTER — Other Ambulatory Visit: Payer: Self-pay

## 2024-05-24 MED ORDER — ESTRADIOL 0.5 MG PO TABS: 0.5000 mg | ORAL_TABLET | Freq: Every day | ORAL | 3 refills | Status: AC

## 2024-05-30 ENCOUNTER — Encounter: Admitting: Family Medicine

## 2024-06-13 ENCOUNTER — Ambulatory Visit: Admitting: Family Medicine

## 2024-11-13 ENCOUNTER — Ambulatory Visit
